# Patient Record
Sex: Female | Born: 1960
Health system: Southern US, Community
[De-identification: ages and names within clinical notes are randomized; demographics above are authoritative.]

## PROBLEM LIST (undated history)

## (undated) DIAGNOSIS — T8859XA Other complications of anesthesia, initial encounter: Secondary | ICD-10-CM

## (undated) DIAGNOSIS — Z9889 Other specified postprocedural states: Secondary | ICD-10-CM

## (undated) DIAGNOSIS — R112 Nausea with vomiting, unspecified: Secondary | ICD-10-CM

## (undated) HISTORY — PX: PAROTID GLAND TUMOR EXCISION: SHX5221

## (undated) HISTORY — PX: CERVICAL SPINE SURGERY: SHX589

## (undated) HISTORY — PX: WISDOM TOOTH EXTRACTION: SHX21

---

## 2001-12-22 HISTORY — PX: CERVICAL SPINE SURGERY: SHX589

## 2008-12-22 HISTORY — PX: AUGMENTATION MAMMAPLASTY: SUR837

## 2019-06-28 LAB — HM MAMMOGRAPHY

## 2019-08-08 LAB — HM COLONOSCOPY

## 2020-01-31 ENCOUNTER — Other Ambulatory Visit: Payer: Self-pay | Admitting: Obstetrics and Gynecology

## 2020-03-08 DIAGNOSIS — N951 Menopausal and female climacteric states: Secondary | ICD-10-CM | POA: Diagnosis not present

## 2020-03-08 DIAGNOSIS — Z7189 Other specified counseling: Secondary | ICD-10-CM | POA: Diagnosis not present

## 2020-03-08 DIAGNOSIS — Z79890 Hormone replacement therapy (postmenopausal): Secondary | ICD-10-CM | POA: Diagnosis not present

## 2020-03-12 DIAGNOSIS — F520 Hypoactive sexual desire disorder: Secondary | ICD-10-CM | POA: Diagnosis not present

## 2020-03-12 DIAGNOSIS — N951 Menopausal and female climacteric states: Secondary | ICD-10-CM | POA: Diagnosis not present

## 2020-03-19 DIAGNOSIS — Z13220 Encounter for screening for lipoid disorders: Secondary | ICD-10-CM | POA: Diagnosis not present

## 2020-03-19 DIAGNOSIS — Z7189 Other specified counseling: Secondary | ICD-10-CM | POA: Diagnosis not present

## 2020-03-19 DIAGNOSIS — Z79899 Other long term (current) drug therapy: Secondary | ICD-10-CM | POA: Diagnosis not present

## 2020-03-19 DIAGNOSIS — F520 Hypoactive sexual desire disorder: Secondary | ICD-10-CM | POA: Diagnosis not present

## 2020-04-03 DIAGNOSIS — Z7189 Other specified counseling: Secondary | ICD-10-CM | POA: Diagnosis not present

## 2020-04-18 DIAGNOSIS — Z7189 Other specified counseling: Secondary | ICD-10-CM | POA: Diagnosis not present

## 2020-04-18 DIAGNOSIS — N951 Menopausal and female climacteric states: Secondary | ICD-10-CM | POA: Diagnosis not present

## 2020-05-01 ENCOUNTER — Ambulatory Visit
Admission: EM | Admit: 2020-05-01 | Discharge: 2020-05-01 | Disposition: A | Discharge status: Home / Self Care | Payer: 59

## 2020-05-01 ENCOUNTER — Encounter: Payer: Self-pay | Admitting: Emergency Medicine

## 2020-05-01 ENCOUNTER — Other Ambulatory Visit: Payer: Self-pay

## 2020-05-01 DIAGNOSIS — R519 Headache, unspecified: Secondary | ICD-10-CM

## 2020-05-01 DIAGNOSIS — R5383 Other fatigue: Secondary | ICD-10-CM

## 2020-05-01 NOTE — Discharge Instructions (Addendum)
Your COVID test is pending.  You should self quarantine until the test result is back.    Take Tylenol as needed for fever or discomfort.  Rest and keep yourself hydrated.    Go to the emergency department if you develop shortness of breath, severe diarrhea, high fever not relieved by Tylenol or ibuprofen, or other concerning symptoms.    

## 2020-05-01 NOTE — ED Triage Notes (Signed)
Patient in today c/o headache, fatigue and fever (100.5 with Tylenol or Ibuprofen). Patient states the headache is getting some better and no fever this morning, but fatigue has continued. Patient has had both Pfizer vaccines for Covid, with the last ~3-4 weeks ago.

## 2020-05-01 NOTE — ED Provider Notes (Signed)
Roderic Palau    CSN: [id number] Arrival date & time: 05/01/20  0935      History   Chief Complaint Chief Complaint  Patient presents with  . Headache  . Fever  . Fatigue    HPI [name] is a 59 y.o. female.   Patient presents with fatigue, headache, fever x4 days.  T-max 100.5.  She has been treating her symptoms at home with Tylenol and ibuprofen.  She denies sore throat, cough, shortness of breath, vomiting, diarrhea, or other symptoms.    The history is provided by the patient.    History reviewed. No pertinent past medical history.  There are no problems to display for this patient.   Past Surgical History:  Procedure Laterality Date  . CERVICAL SPINE SURGERY    . PAROTID GLAND TUMOR EXCISION      OB History   No obstetric history on file.      Home Medications    Prior to Admission medications   Medication Sig Start Date End Date Taking? Authorizing Provider  Biotin 5 MG CAPS Take 1 capsule by mouth in the morning and at bedtime.   Yes [provider]  Cholecalciferol 50 MCG (2000 UT) CAPS Take 1 capsule by mouth daily.   Yes [provider]  esomeprazole (NEXIUM) 20 MG capsule Take 1 capsule by mouth daily.   Yes [provider]  Estradiol 10 MCG TABS vaginal tablet Place 1 tablet vaginally 2 (two) times a week. 02/02/20 05/02/20 Yes [provider]  melatonin 3 MG TABS tablet Take 3 mg by mouth at bedtime.   Yes [provider]  metaxalone (SKELAXIN) 800 MG tablet Take 1 tablet by mouth at bedtime. prn 09/25/14  Yes [provider]  Probiotic Product (ALIGN PO) Take 1 tablet by mouth daily.   Yes [provider]  progesterone (PROMETRIUM) 200 MG capsule Take 1 capsule by mouth at bedtime. 11/14/14  Yes [provider]  testosterone cypionate (DEPOTESTOSTERONE CYPIONATE) 200 MG/ML injection Inject 200 mg into the muscle every 28 (twenty-eight) days. 02/06/20  Yes [provider]  traZODone (DESYREL) 50 MG tablet Take 25 mg by mouth at bedtime. 11/07/14  Yes [provider]    Family History Family History  Problem Relation Age of Onset  . Lung cancer Mother   . Thyroid disease Mother   . Hypertension Mother   . Hyperlipidemia Mother   . CAD Mother        stent x 1  . Leukemia Father 36    Social History Social History   Tobacco Use  . Smoking status: Never Smoker  . Smokeless tobacco: Never Used  Substance Use Topics  . Alcohol use: Yes    Comment: ocassional  . Drug use: Never     Allergies   Pertussis vaccines   Review of Systems Review of Systems  Constitutional: Positive for fatigue and fever. Negative for chills.  HENT: Negative for congestion, ear pain and sore throat.   Eyes: Negative for pain and visual disturbance.  Respiratory: Negative for cough and shortness of breath.   Cardiovascular: Negative for chest pain and palpitations.  Gastrointestinal: Negative for abdominal pain, diarrhea, nausea and vomiting.  Genitourinary: Negative for dysuria and hematuria.  Musculoskeletal: Negative for arthralgias and back pain.  Skin: Negative for color change and rash.  Neurological: Positive for headaches. Negative for seizures, syncope, weakness and numbness.  All other systems reviewed and are negative.    Physical Exam  Triage Vital Signs ED Triage Vitals  Enc Vitals Group     BP 05/01/20 0939 101/68     Pulse Rate 05/01/20 0939 94     Resp 05/01/20 0939 18     Temp 05/01/20 0939 98.2 F (36.8 C)     Temp Source 05/01/20 0939 Oral     SpO2 05/01/20 0939 97 %     Weight 05/01/20 0940 148 lb (67.1 kg)     Height 05/01/20 0940 5\' 3"  (1.6 m)     Head Circumference --      Peak Flow --      Pain Score 05/01/20 0940 3     Pain Loc --      Pain Edu? --      Excl. in [place]? --    No data found.  Updated Vital Signs BP 101/68 (BP Location: Left Arm)   Pulse 94   Temp 98.2 F (36.8 C) (Oral)   Resp 18    Ht 5\' 3"  (1.6 m)   Wt 148 lb (67.1 kg)   SpO2 97%   BMI 26.22 kg/m   Visual Acuity Right Eye Distance:   Left Eye Distance:   Bilateral Distance:    Right Eye Near:   Left Eye Near:    Bilateral Near:     Physical Exam Vitals and nursing note reviewed.  Constitutional:      General: She is not in acute distress.    Appearance: She is well-developed. She is ill-appearing.  HENT:     Head: Normocephalic and atraumatic.     Right Ear: Tympanic membrane normal.     Left Ear: Tympanic membrane normal.     Nose: Nose normal.     Mouth/Throat:     Mouth: Mucous membranes are moist.     Pharynx: Oropharynx is clear.  Eyes:     Conjunctiva/sclera: Conjunctivae normal.  Cardiovascular:     Rate and Rhythm: Normal rate and regular rhythm.     Heart sounds: No murmur.  Pulmonary:     Effort: Pulmonary effort is normal. No respiratory distress.     Breath sounds: Normal breath sounds.  Abdominal:     Palpations: Abdomen is soft.     Tenderness: There is no abdominal tenderness. There is no guarding or rebound.  Musculoskeletal:     Cervical back: Neck supple.  Skin:    General: Skin is warm and dry.     Findings: No rash.  Neurological:     General: No focal deficit present.     Mental Status: She is alert and oriented to person, place, and time.     Sensory: No sensory deficit.     Motor: No weakness.     Gait: Gait normal.  Psychiatric:        Mood and Affect: Mood normal.        Behavior: Behavior normal.      UC Treatments / Results  Labs (all labs ordered are listed, but only abnormal results are displayed) Labs Reviewed  NOVEL CORONAVIRUS, NAA    EKG   Radiology No results found.  Procedures Procedures (including critical care time)  Medications Ordered in UC Medications - No data to display  Initial Impression / Assessment and Plan / UC Course  I have reviewed the triage vital signs and the nursing notes.  Pertinent labs & imaging results that  were available during my care of the patient were reviewed by me and considered in my medical decision  making (see chart for details).   Fatigue, headache.  PCR COVID test performed here.  Instructed patient to self quarantine until the test result is back.  Discussed with patient that she can take Tylenol as needed for fever or discomfort.  Instructed patient to go to the emergency department if she develops high fever, shortness of breath, severe diarrhea, or other concerning symptoms.  Patient agrees with plan of care.     Final Clinical Impressions(s) / UC Diagnoses   Final diagnoses:  Fatigue, unspecified type  Acute nonintractable headache, unspecified headache type     Discharge Instructions     Your COVID test is pending.  You should self quarantine until the test result is back.    Take Tylenol as needed for fever or discomfort.  Rest and keep yourself hydrated.    Go to the emergency department if you develop shortness of breath, severe diarrhea, high fever not relieved by Tylenol or ibuprofen, or other concerning symptoms.       ED Prescriptions    None     PDMP not reviewed this encounter.   Sharion Balloon, NP 05/01/20 1017

## 2020-05-02 LAB — SARS-COV-2, NAA 2 DAY TAT

## 2020-05-02 LAB — NOVEL CORONAVIRUS, NAA: SARS-CoV-2, NAA: NOT DETECTED

## 2020-05-11 DIAGNOSIS — N951 Menopausal and female climacteric states: Secondary | ICD-10-CM | POA: Diagnosis not present

## 2020-05-11 DIAGNOSIS — Z79890 Hormone replacement therapy (postmenopausal): Secondary | ICD-10-CM | POA: Diagnosis not present

## 2020-05-11 DIAGNOSIS — F520 Hypoactive sexual desire disorder: Secondary | ICD-10-CM | POA: Diagnosis not present

## 2020-05-20 ENCOUNTER — Encounter: Payer: Self-pay | Admitting: Nurse Practitioner

## 2020-05-20 DIAGNOSIS — Z79890 Hormone replacement therapy (postmenopausal): Secondary | ICD-10-CM | POA: Insufficient documentation

## 2020-05-20 DIAGNOSIS — G4700 Insomnia, unspecified: Secondary | ICD-10-CM | POA: Insufficient documentation

## 2020-05-23 ENCOUNTER — Other Ambulatory Visit: Payer: Self-pay

## 2020-05-23 ENCOUNTER — Encounter: Payer: Self-pay | Admitting: Nurse Practitioner

## 2020-05-23 ENCOUNTER — Ambulatory Visit: Payer: 59 | Admitting: Nurse Practitioner

## 2020-05-23 VITALS — BP 108/69 | HR 98 | Temp 98.4°F | Ht 62.8 in | Wt 158.6 lb

## 2020-05-23 DIAGNOSIS — Z79890 Hormone replacement therapy (postmenopausal): Secondary | ICD-10-CM

## 2020-05-23 DIAGNOSIS — M47812 Spondylosis without myelopathy or radiculopathy, cervical region: Secondary | ICD-10-CM

## 2020-05-23 DIAGNOSIS — Z7689 Persons encountering health services in other specified circumstances: Secondary | ICD-10-CM | POA: Diagnosis not present

## 2020-05-23 DIAGNOSIS — K219 Gastro-esophageal reflux disease without esophagitis: Secondary | ICD-10-CM | POA: Diagnosis not present

## 2020-05-23 DIAGNOSIS — F5101 Primary insomnia: Secondary | ICD-10-CM

## 2020-05-23 DIAGNOSIS — N952 Postmenopausal atrophic vaginitis: Secondary | ICD-10-CM

## 2020-05-23 DIAGNOSIS — M8580 Other specified disorders of bone density and structure, unspecified site: Secondary | ICD-10-CM

## 2020-05-23 MED ORDER — NITROFURANTOIN MONOHYD MACRO 100 MG PO CAPS: ORAL_CAPSULE | ORAL | 4 refills | Status: DC

## 2020-05-23 NOTE — Progress Notes (Signed)
New Patient Office Visit  Subjective:  Patient ID: [name], female    DOB: 10/15/1961  Age: 59 y.o. MRN: [id number]  CC:  Chief Complaint  Patient presents with   Establish Care    HPI [name] presents for new patient visit to establish care.  Introduced to Designer, jewellery role and practice setting.  All questions answered.  Discussed provider/patient relationship and expectations.  At her last DEXA in 2019 was told had some osteopenia, takes Vitamin D.    She is followed by GYN for menopausal symptoms and last saw Dr. Leafy Ro 04/18/20.  She receives Testosterone injections at their office and continues on Progesterone and Yuvafem.  She started on this regimen in [place] due to major symptoms, including arthritic type pain and muscle pains + sleep issues.  On current regimen she is able to function better.  She takes occasional Skelaxin due to neck pain occasionally with past surgery, C5-C7 diskectomy in 2003.  Continues on Macrobid after intercourse as preventative.  INSOMNIA Started on Trazodone during menopause, takes this nightly. Duration: chronic Satisfied with sleep quality: yes Difficulty falling asleep: at times Difficulty staying asleep: no Waking a few hours after sleep onset: no Early morning awakenings: no Daytime hypersomnolence: no Wakes feeling refreshed: no Good sleep hygiene: no Apnea: no Snoring: no Depressed/anxious mood: no Recent stress: no Restless legs/nocturnal leg cramps: no Chronic pain/arthritis: no History of sleep study: no Treatments attempted: Trazodone and ambien   GERD Continues on Nexium 20 MG daily, has taken for several years.  Has endoscopy last year with her colonoscopy.   GERD control status: stable  Satisfied with current treatment? yes Heartburn frequency: none Medication side effects: no  Medication compliance: stable Dysphagia: no Odynophagia:  no Hematemesis: no Blood in stool: no EGD: yes  History reviewed.  No pertinent past medical history.  Past Surgical History:  Procedure Laterality Date   CERVICAL SPINE SURGERY  2003   C5-C7- double dissectomy   PAROTID GLAND TUMOR EXCISION      Family History  Problem Relation Age of Onset   Lung cancer Mother    Thyroid disease Mother    Hypertension Mother    Hyperlipidemia Mother    CAD Mother        stent x 1   Osteoporosis Mother    Leukemia Father 36   Rheum arthritis Sister    Melanoma Sister    Osteopenia Sister    Autoimmune disease Daughter    Colon cancer Maternal Uncle    Heart disease Maternal Grandmother    Colon cancer Maternal Grandfather    Heart disease Maternal Grandfather    Stroke Maternal Grandfather    Heart disease Paternal Grandfather     Social History   Socioeconomic History   Marital status: Married    Spouse name: Not on file   Number of children: Not on file   Years of education: Not on file   Highest education level: Not on file  Occupational History   Not on file  Tobacco Use   Smoking status: Never Smoker   Smokeless tobacco: Never Used  Substance and Sexual Activity   Alcohol use: Yes    Comment: ocassional   Drug use: Never   Sexual activity: Yes  Other Topics Concern   Not on file  Social History Narrative   Not on file   Social Determinants of Health   Financial Resource Strain: Low Risk    Difficulty of Paying Living Expenses: Not hard  at all  Food Insecurity: No Food Insecurity   Worried About Charity fundraiser in the Last Year: Never true   Ran Out of Food in the Last Year: Never true  Transportation Needs: No Transportation Needs   Lack of Transportation (Medical): No   Lack of Transportation (Non-Medical): No  Physical Activity: Insufficiently Active   Days of Exercise per Week: 3 days   Minutes of Exercise per Session: 30 min  Stress: Stress Concern Present   Feeling of Stress : Rather much  Social Connections: Somewhat Isolated    Frequency of Communication with Friends and Family: Three times a week   Frequency of Social Gatherings with Friends and Family: Three times a week   Attends Religious Services: Never   Active Member of Clubs or Organizations: No   Attends Archivist Meetings: Never   Marital Status: Married  Human resources officer Violence:    Fear of Current or Ex-Partner:    Emotionally Abused:    Physically Abused:    Sexually Abused:     ROS Review of Systems  Constitutional: Negative for activity change, appetite change, diaphoresis, fatigue and fever.  Respiratory: Negative for cough, chest tightness and shortness of breath.   Cardiovascular: Negative for chest pain, palpitations and leg swelling.  Gastrointestinal: Negative.   Neurological: Negative.   Psychiatric/Behavioral: Negative.     Objective:   Today's Vitals: BP 108/69    Pulse 98    Temp 98.4 F (36.9 C) (Oral)    Ht 5' 2.8" (1.595 m)    Wt 158 lb 9.6 oz (71.9 kg)    SpO2 96%    BMI 28.27 kg/m   Physical Exam Vitals and nursing note reviewed.  Constitutional:      General: She is awake. She is not in acute distress.    Appearance: She is well-developed, well-groomed and overweight. She is not ill-appearing.  HENT:     Head: Normocephalic.     Right Ear: Hearing normal.     Left Ear: Hearing normal.  Eyes:     General: Lids are normal.        Right eye: No discharge.        Left eye: No discharge.     Conjunctiva/sclera: Conjunctivae normal.     Pupils: Pupils are equal, round, and reactive to light.  Neck:     Thyroid: No thyromegaly.     Vascular: No carotid bruit.  Cardiovascular:     Rate and Rhythm: Normal rate and regular rhythm.     Heart sounds: Normal heart sounds. No murmur. No gallop.   Pulmonary:     Effort: Pulmonary effort is normal. No accessory muscle usage or respiratory distress.     Breath sounds: Normal breath sounds.  Abdominal:     General: Bowel sounds are normal.      Palpations: Abdomen is soft.  Musculoskeletal:     Cervical back: Normal range of motion and neck supple.     Right lower leg: No edema.     Left lower leg: No edema.  Skin:    General: Skin is warm and dry.  Neurological:     Mental Status: She is alert and oriented to person, place, and time.     Deep Tendon Reflexes: Reflexes are normal and symmetric.  Psychiatric:        Attention and Perception: Attention normal.        Mood and Affect: Mood normal.  Speech: Speech normal.        Behavior: Behavior normal. Behavior is cooperative.        Thought Content: Thought content normal.     Assessment & Plan:   Problem List Items Addressed This Visit      Digestive   GERD (gastroesophageal reflux disease)    Chronic, stable.  Continue daily Nexium and adjust regimen as needed.  Review past chart once available. Had colonoscopy and EGD last year.        Musculoskeletal and Integument   Cervical spine arthritis    Ongoing post-surgery cervical spine C5-C7 diskectomy in 2003.  Continue Skelaxin as needed, refills upon request.      Osteopenia    Per patient noted on DEXA in 2019.  Continue daily Vitamin D3 and Calcium supplements.  Repeat DEXA in 2024.        Genitourinary   Vaginal atrophy    Ongoing, continue post coital abx prophylaxis as needed to avoid UTI.  Refills sent on this.        Other   Insomnia    Chronic, stable.  Continue current medication regimen, Trazodone and Melatonin, adjust as needed.  Will send refills upon request.  Continue focus on good sleep hygiene techniques at home.  Return for physical in upcoming weeks.      Postmenopausal hormone therapy    Continue collaboration with GYN at [facility].       Other Visit Diagnoses    Encounter to establish care    -  Primary      Outpatient Encounter Medications as of 05/23/2020  Medication Sig   Biotin 5 MG CAPS Take 1 capsule by mouth in the morning and at bedtime.    Cholecalciferol 50 MCG (2000 UT) CAPS Take 1 capsule by mouth daily.   esomeprazole (NEXIUM) 20 MG capsule Take 1 capsule by mouth daily.   melatonin 3 MG TABS tablet Take 3 mg by mouth at bedtime.   metaxalone (SKELAXIN) 800 MG tablet Take 1 tablet by mouth at bedtime. prn   nitrofurantoin, macrocrystal-monohydrate, (MACROBID) 100 MG capsule Take 1 tablet (100 MG) by mouth only after intercourse.   Probiotic Product (ALIGN PO) Take 1 tablet by mouth daily.   progesterone (PROMETRIUM) 200 MG capsule Take 1 capsule by mouth at bedtime.   testosterone cypionate (DEPOTESTOSTERONE CYPIONATE) 200 MG/ML injection Inject 200 mg into the muscle every 28 (twenty-eight) days.   traZODone (DESYREL) 50 MG tablet Take 25 mg by mouth at bedtime.   YUVAFEM 10 MCG TABS vaginal tablet Place 1 tablet vaginally 2 (two) times a week.   [DISCONTINUED] nitrofurantoin, macrocrystal-monohydrate, (MACROBID) 100 MG capsule Take by mouth. Take 1 tablet only after intercourse   No facility-administered encounter medications on file as of 05/23/2020.    Follow-up: Return if symptoms worsen or fail to improve, for patient to call and schedule physical once she sees when last one was at previous practice.   Venita Lick, NP

## 2020-05-23 NOTE — Assessment & Plan Note (Signed)
Ongoing, continue post coital abx prophylaxis as needed to avoid UTI.  Refills sent on this.

## 2020-05-23 NOTE — Assessment & Plan Note (Signed)
Chronic, stable.  Continue current medication regimen, Trazodone and Melatonin, adjust as needed.  Will send refills upon request.  Continue focus on good sleep hygiene techniques at home.  Return for physical in upcoming weeks.

## 2020-05-23 NOTE — Assessment & Plan Note (Signed)
Continue collaboration with GYN at [facility]. 

## 2020-05-23 NOTE — Assessment & Plan Note (Signed)
Per patient noted on DEXA in 2019.  Continue daily Vitamin D3 and Calcium supplements.  Repeat DEXA in 2024.

## 2020-05-23 NOTE — Assessment & Plan Note (Signed)
Chronic, stable.  Continue daily Nexium and adjust regimen as needed.  Review past chart once available. Had colonoscopy and EGD last year.

## 2020-05-23 NOTE — Patient Instructions (Signed)
Healthy Eating Following a healthy eating pattern may help you to achieve and maintain a healthy body weight, reduce the risk of chronic disease, and live a long and productive life. It is important to follow a healthy eating pattern at an appropriate calorie level for your body. Your nutritional needs should be met primarily through food by choosing a variety of nutrient-rich foods. What are tips for following this plan? Reading food labels  Read labels and choose the following: ? Reduced or low sodium. ? Juices with 100% fruit juice. ? Foods with low saturated fats and high polyunsaturated and monounsaturated fats. ? Foods with whole grains, such as whole wheat, cracked wheat, brown rice, and wild rice. ? Whole grains that are fortified with folic acid. This is recommended for women who are pregnant or who want to become pregnant.  Read labels and avoid the following: ? Foods with a lot of added sugars. These include foods that contain brown sugar, corn sweetener, corn syrup, dextrose, fructose, glucose, high-fructose corn syrup, honey, invert sugar, lactose, malt syrup, maltose, molasses, raw sugar, sucrose, trehalose, or turbinado sugar.  Do not eat more than the following amounts of added sugar per day:  6 teaspoons (25 g) for women.  9 teaspoons (38 g) for men. ? Foods that contain processed or refined starches and grains. ? Refined grain products, such as white flour, degermed cornmeal, white bread, and white rice. Shopping  Choose nutrient-rich snacks, such as vegetables, whole fruits, and nuts. Avoid high-calorie and high-sugar snacks, such as potato chips, fruit snacks, and candy.  Use oil-based dressings and spreads on foods instead of solid fats such as butter, stick margarine, or cream cheese.  Limit pre-made sauces, mixes, and "instant" products such as flavored rice, instant noodles, and ready-made pasta.  Try more plant-protein sources, such as tofu, tempeh, black beans,  edamame, lentils, nuts, and seeds.  Explore eating plans such as the Mediterranean diet or vegetarian diet. Cooking  Use oil to saut or stir-fry foods instead of solid fats such as butter, stick margarine, or lard.  Try baking, boiling, grilling, or broiling instead of frying.  Remove the fatty part of meats before cooking.  Steam vegetables in water or broth. Meal planning   At meals, imagine dividing your plate into fourths: ? One-half of your plate is fruits and vegetables. ? One-fourth of your plate is whole grains. ? One-fourth of your plate is protein, especially lean meats, poultry, eggs, tofu, beans, or nuts.  Include low-fat dairy as part of your daily diet. Lifestyle  Choose healthy options in all settings, including home, work, school, restaurants, or stores.  Prepare your food safely: ? Wash your hands after handling raw meats. ? Keep food preparation surfaces clean by regularly washing with hot, soapy water. ? Keep raw meats separate from ready-to-eat foods, such as fruits and vegetables. ? Cook seafood, meat, poultry, and eggs to the recommended internal temperature. ? Store foods at safe temperatures. In general:  Keep cold foods at 59F (4.4C) or below.  Keep hot foods at 159F (60C) or above.  Keep your freezer at [facility] (-17.8C) or below.  Foods are no longer safe to eat when they have been between the temperatures of 40-159F (4.4-60C) for more than 2 hours. What foods should I eat? Fruits Aim to eat 2 cup-equivalents of fresh, canned (in natural juice), or frozen fruits each day. Examples of 1 cup-equivalent of fruit include 1 small apple, 8 large strawberries, 1 cup canned fruit,  cup  dried fruit, or 1 cup 100% juice. Vegetables Aim to eat 2-3 cup-equivalents of fresh and frozen vegetables each day, including different varieties and colors. Examples of 1 cup-equivalent of vegetables include 2 medium carrots, 2 cups raw, leafy greens, 1 cup chopped  vegetable (raw or cooked), or 1 medium baked potato. Grains Aim to eat 6 ounce-equivalents of whole grains each day. Examples of 1 ounce-equivalent of grains include 1 slice of bread, 1 cup ready-to-eat cereal, 3 cups popcorn, or  cup cooked rice, pasta, or cereal. Meats and other proteins Aim to eat 5-6 ounce-equivalents of protein each day. Examples of 1 ounce-equivalent of protein include 1 egg, 1/2 cup nuts or seeds, or 1 tablespoon (16 g) peanut butter. A cut of meat or fish that is the size of a deck of cards is about 3-4 ounce-equivalents.  Of the protein you eat each week, try to have at least 8 ounces come from seafood. This includes salmon, trout, herring, and anchovies. Dairy Aim to eat 3 cup-equivalents of fat-free or low-fat dairy each day. Examples of 1 cup-equivalent of dairy include 1 cup (240 mL) milk, 8 ounces (250 g) yogurt, 1 ounces (44 g) natural cheese, or 1 cup (240 mL) fortified soy milk. Fats and oils  Aim for about 5 teaspoons (21 g) per day. Choose monounsaturated fats, such as canola and olive oils, avocados, peanut butter, and most nuts, or polyunsaturated fats, such as sunflower, corn, and soybean oils, walnuts, pine nuts, sesame seeds, sunflower seeds, and flaxseed. Beverages  Aim for six 8-oz glasses of water per day. Limit coffee to three to five 8-oz cups per day.  Limit caffeinated beverages that have added calories, such as soda and energy drinks.  Limit alcohol intake to no more than 1 drink a day for nonpregnant women and 2 drinks a day for men. One drink equals 12 oz of beer (355 mL), 5 oz of wine (148 mL), or 1 oz of hard liquor (44 mL). Seasoning and other foods  Avoid adding excess amounts of salt to your foods. Try flavoring foods with herbs and spices instead of salt.  Avoid adding sugar to foods.  Try using oil-based dressings, sauces, and spreads instead of solid fats. This information is based on general [place] nutrition guidelines. For more  information, visit [employer]. Exact amounts may vary based on your nutrition needs. Summary  A healthy eating plan may help you to maintain a healthy weight, reduce the risk of chronic diseases, and stay active throughout your life.  Plan your meals. Make sure you eat the right portions of a variety of nutrient-rich foods.  Try baking, boiling, grilling, or broiling instead of frying.  Choose healthy options in all settings, including home, work, school, restaurants, or stores. This information is not intended to replace advice given to you by your health care provider. Make sure you discuss any questions you have with your health care provider. Document Revised: 03/22/2018 Document Reviewed: 03/22/2018 Elsevier Patient Education  [place].

## 2020-05-23 NOTE — Assessment & Plan Note (Signed)
Ongoing post-surgery cervical spine C5-C7 diskectomy in 2003.  Continue Skelaxin as needed, refills upon request.

## 2020-06-13 DIAGNOSIS — Z79890 Hormone replacement therapy (postmenopausal): Secondary | ICD-10-CM | POA: Diagnosis not present

## 2020-06-13 DIAGNOSIS — F520 Hypoactive sexual desire disorder: Secondary | ICD-10-CM | POA: Diagnosis not present

## 2020-06-19 ENCOUNTER — Other Ambulatory Visit: Payer: Self-pay | Admitting: Nurse Practitioner

## 2020-06-19 MED ORDER — TRAZODONE HCL 50 MG PO TABS: 25.0000 mg | ORAL_TABLET | Freq: Every day | ORAL | 4 refills | Status: DC

## 2020-07-12 ENCOUNTER — Encounter: Payer: Self-pay | Admitting: Nurse Practitioner

## 2020-07-12 ENCOUNTER — Other Ambulatory Visit: Payer: Self-pay

## 2020-07-12 ENCOUNTER — Ambulatory Visit (INDEPENDENT_AMBULATORY_CARE_PROVIDER_SITE_OTHER): Payer: 59 | Admitting: Nurse Practitioner

## 2020-07-12 VITALS — BP 113/72 | HR 86 | Temp 98.6°F | Ht 62.1 in | Wt 158.6 lb

## 2020-07-12 DIAGNOSIS — Z114 Encounter for screening for human immunodeficiency virus [HIV]: Secondary | ICD-10-CM

## 2020-07-12 DIAGNOSIS — Z1231 Encounter for screening mammogram for malignant neoplasm of breast: Secondary | ICD-10-CM

## 2020-07-12 DIAGNOSIS — M8580 Other specified disorders of bone density and structure, unspecified site: Secondary | ICD-10-CM | POA: Diagnosis not present

## 2020-07-12 DIAGNOSIS — K219 Gastro-esophageal reflux disease without esophagitis: Secondary | ICD-10-CM | POA: Diagnosis not present

## 2020-07-12 DIAGNOSIS — B009 Herpesviral infection, unspecified: Secondary | ICD-10-CM | POA: Insufficient documentation

## 2020-07-12 DIAGNOSIS — Z79890 Hormone replacement therapy (postmenopausal): Secondary | ICD-10-CM

## 2020-07-12 DIAGNOSIS — Z13220 Encounter for screening for lipoid disorders: Secondary | ICD-10-CM | POA: Diagnosis not present

## 2020-07-12 DIAGNOSIS — Z1159 Encounter for screening for other viral diseases: Secondary | ICD-10-CM

## 2020-07-12 DIAGNOSIS — Z0000 Encounter for general adult medical examination without abnormal findings: Secondary | ICD-10-CM

## 2020-07-12 DIAGNOSIS — F5101 Primary insomnia: Secondary | ICD-10-CM

## 2020-07-12 DIAGNOSIS — N952 Postmenopausal atrophic vaginitis: Secondary | ICD-10-CM | POA: Diagnosis not present

## 2020-07-12 MED ORDER — VALACYCLOVIR HCL 1 G PO TABS
ORAL_TABLET | ORAL | 1 refills | Status: DC
Start: 2020-07-12 — End: 2021-07-31

## 2020-07-12 NOTE — Assessment & Plan Note (Addendum)
History of with occasional flares, currently with flare due to stressors.  Valtrex refill sent in.

## 2020-07-12 NOTE — Assessment & Plan Note (Signed)
Continue collaboration with GYN at [facility]. 

## 2020-07-12 NOTE — Patient Instructions (Signed)
[facility]  Address: [address], [place], [place] [address]  Phone: [phone]   Mammogram A mammogram is an X-ray of the breasts that is done to check for changes that are not normal. This test can screen for and find any changes that may suggest breast cancer. Mammograms are regularly done on women. A man may have a mammogram if he has a lump or swelling in his breast. This test can also help to find other changes and variations in the breast. Tell a doctor:  About any allergies you have.  If you have breast implants.  If you have had previous breast disease, biopsy, or surgery.  If you are breastfeeding.  If you are younger than age 48.  If you have a family history of breast cancer.  Whether you are pregnant or may be pregnant. What are the risks? Generally, this is a safe procedure. However, problems may occur, including:  Exposure to radiation. Radiation levels are very low with this test.  The results being misinterpreted.  The need for further tests.  The inability of the mammogram to detect certain cancers. What happens before the procedure?  Have this test done about 1-2 weeks after your period. This is usually when your breasts are the least tender.  If you are visiting a new doctor or clinic, send any past mammogram images to your new doctor's office.  Wash your breasts and under your arms the day of the test.  Do not use deodorants, perfumes, lotions, or powders on the day of the test.  Take off any jewelry from your neck.  Wear clothes that you can change into and out of easily. What happens during the procedure?   You will undress from the waist up. You will put on a gown.  You will stand in front of the X-ray machine.  Each breast will be placed between two plastic or glass plates. The plates will press down on your breast for a few seconds. Try to stay as relaxed as possible. This does not cause any  harm to your breasts. Any discomfort you feel will be very brief.  X-rays will be taken from different angles of each breast. The procedure may vary among doctors and hospitals. What happens after the procedure?  The mammogram will be read by a specialist (radiologist).  You may need to do certain parts of the test again. This depends on the quality of the images.  Ask when your test results will be ready. Make sure you get your test results.  You may go back to your normal activities. Summary  A mammogram is a low energy X-ray of the breasts that is done to check for abnormal changes. A man may have this test if he has a lump or swelling in his breast.  Before the procedure, tell your doctor about any breast problems that you have had in the past.  Have this test done about 1-2 weeks after your period.  For the test, each breast will be placed between two plastic or glass plates. The plates will press down on your breast for a few seconds.  The mammogram will be read by a specialist (radiologist). Ask when your test results will be ready. Make sure you get your test results. This information is not intended to replace advice given to you by your health care provider. Make sure you discuss any questions you have with your health care provider. Document Revised: 07/29/2018 Document  Reviewed: 07/29/2018 Elsevier Patient Education  2020 Elsevier Inc.  

## 2020-07-12 NOTE — Progress Notes (Signed)
BP 113/72   Pulse 86   Temp 98.6 F (37 C) (Oral)   Ht 5' 2.1" (1.577 m)   Wt 158 lb 9.6 oz (71.9 kg)   SpO2 98%   BMI 28.91 kg/m    Subjective:    Patient ID: [name], female    DOB: [birth year], 59 y.o.   MRN: [id number]  HPI: [name] is a 59 y.o. female presenting on 07/12/2020 for comprehensive medical examination. Current medical complaints include:none  She currently lives with: husband Menopausal Symptoms: no    Followed by GYN for menopausal symptoms and last saw Dr. Leafy Ro 04/18/20.  She receives Testosterone injections at their office and continues on Progesterone and Yuvafem.  She started on this regimen in [place] due to major symptoms, including arthritic type pain + muscle pains + sleep issues.  On current regimen she is able to function better.  She takes occasional Skelaxin due to neck pain occasionally with past surgery, C5-C7 diskectomy in 2003.  Continues on Macrobid after intercourse as preventative.  INSOMNIA Started on Trazodone during menopause, takes this nightly. Duration: chronic Satisfied with sleep quality: yes Difficulty falling asleep: at times Difficulty staying asleep: no Waking a few hours after sleep onset: no Early morning awakenings: no Daytime hypersomnolence: no Wakes feeling refreshed: no Good sleep hygiene: no Apnea: no Snoring: no Depressed/anxious mood: no Recent stress: no Restless legs/nocturnal leg cramps: no Chronic pain/arthritis: no History of sleep study: no Treatments attempted: Trazodone and ambien   GERD Continues on Nexium 20 MG daily, has taken for several years.  Has endoscopy last year with her colonoscopy.   GERD control status: stable  Satisfied with current treatment? yes Heartburn frequency: none Medication side effects: no  Medication compliance: stable Dysphagia: no Odynophagia:  no Hematemesis: no Blood in stool: no EGD: yes  Depression Screen done today and results listed below:    Depression screen [facility] 2/9 07/12/2020 05/23/2020  Decreased Interest 0 0  Down, Depressed, Hopeless 0 0  PHQ - 2 Score 0 0    The patient does not have a history of falls. I did not complete a risk assessment for falls. A plan of care for falls was not documented.   Past Medical History:  History reviewed. No pertinent past medical history.  Surgical History:  Past Surgical History:  Procedure Laterality Date  . CERVICAL SPINE SURGERY  2003   C5-C7- double dissectomy  . PAROTID GLAND TUMOR EXCISION      Medications:  Current Outpatient Medications on File Prior to Visit  Medication Sig  . Biotin 5 MG CAPS Take 1 capsule by mouth in the morning and at bedtime.  . Cholecalciferol 50 MCG (2000 UT) CAPS Take 1 capsule by mouth daily.  Marland Kitchen esomeprazole (NEXIUM) 20 MG capsule Take 1 capsule by mouth daily.  . melatonin 3 MG TABS tablet Take 3 mg by mouth at bedtime.  . metaxalone (SKELAXIN) 800 MG tablet Take 1 tablet by mouth at bedtime. prn  . nitrofurantoin, macrocrystal-monohydrate, (MACROBID) 100 MG capsule Take 1 tablet (100 MG) by mouth only after intercourse.  . Probiotic Product (ALIGN PO) Take 1 tablet by mouth daily.  . progesterone (PROMETRIUM) 200 MG capsule Take 1 capsule by mouth at bedtime.  Marland Kitchen testosterone cypionate (DEPOTESTOSTERONE CYPIONATE) 200 MG/ML injection Inject 200 mg into the muscle every 28 (twenty-eight) days.  . traZODone (DESYREL) 50 MG tablet Take 50 mg by mouth at bedtime.  Merril Abbe 10 MCG TABS vaginal tablet Place 1 tablet  vaginally 2 (two) times a week.   No current facility-administered medications on file prior to visit.    Allergies:  No Known Allergies  Social History:  Social History   Socioeconomic History  . Marital status: Married    Spouse name: Not on file  . Number of children: Not on file  . Years of education: Not on file  . Highest education level: Not on file  Occupational History  . Not on file  Tobacco Use  . Smoking  status: Never Smoker  . Smokeless tobacco: Never Used  Vaping Use  . Vaping Use: Never used  Substance and Sexual Activity  . Alcohol use: Yes    Comment: ocassional  . Drug use: Never  . Sexual activity: Yes  Other Topics Concern  . Not on file  Social History Narrative  . Not on file   Social Determinants of Health   Financial Resource Strain: Low Risk   . Difficulty of Paying Living Expenses: Not hard at all  Food Insecurity: No Food Insecurity  . Worried About Charity fundraiser in the Last Year: Never true  . Ran Out of Food in the Last Year: Never true  Transportation Needs: No Transportation Needs  . Lack of Transportation (Medical): No  . Lack of Transportation (Non-Medical): No  Physical Activity: Insufficiently Active  . Days of Exercise per Week: 3 days  . Minutes of Exercise per Session: 30 min  Stress: Stress Concern Present  . Feeling of Stress : Rather much  Social Connections: Moderately Isolated  . Frequency of Communication with Friends and Family: Three times a week  . Frequency of Social Gatherings with Friends and Family: Three times a week  . Attends Religious Services: Never  . Active Member of Clubs or Organizations: No  . Attends Archivist Meetings: Never  . Marital Status: Married  Human resources officer Violence:   . Fear of Current or Ex-Partner:   . Emotionally Abused:   Marland Kitchen Physically Abused:   . Sexually Abused:    Social History   Tobacco Use  Smoking Status Never Smoker  Smokeless Tobacco Never Used   Social History   Substance and Sexual Activity  Alcohol Use Yes   Comment: ocassional    Family History:  Family History  Problem Relation Age of Onset  . Lung cancer Mother   . Thyroid disease Mother   . Hypertension Mother   . Hyperlipidemia Mother   . CAD Mother        stent x 1  . Osteoporosis Mother   . Leukemia Father 62  . Rheum arthritis Sister   . Melanoma Sister   . Osteopenia Sister   . Autoimmune  disease Daughter   . Colon cancer Maternal Uncle   . Heart disease Maternal Grandmother   . Colon cancer Maternal Grandfather   . Heart disease Maternal Grandfather   . Stroke Maternal Grandfather   . Heart disease Paternal Grandfather     Past medical history, surgical history, medications, allergies, family history and social history reviewed with patient today and changes made to appropriate areas of the chart.   Review of Systems - negative All other ROS negative except what is listed above and in the HPI.      Objective:    BP 113/72   Pulse 86   Temp 98.6 F (37 C) (Oral)   Ht 5' 2.1" (1.577 m)   Wt 158 lb 9.6 oz (71.9 kg)   SpO2 98%  BMI 28.91 kg/m   Wt Readings from Last 3 Encounters:  07/12/20 158 lb 9.6 oz (71.9 kg)  05/23/20 158 lb 9.6 oz (71.9 kg)  05/01/20 148 lb (67.1 kg)    Physical Exam Constitutional:      General: She is awake. She is not in acute distress.    Appearance: She is well-developed. She is not ill-appearing.  HENT:     Head: Normocephalic and atraumatic.     Right Ear: Hearing, tympanic membrane, ear canal and external ear normal. No drainage.     Left Ear: Hearing, tympanic membrane, ear canal and external ear normal. No drainage.     Nose: Nose normal.     Right Sinus: No maxillary sinus tenderness or frontal sinus tenderness.     Left Sinus: No maxillary sinus tenderness or frontal sinus tenderness.     Mouth/Throat:     Mouth: Mucous membranes are moist.     Pharynx: Oropharynx is clear. Uvula midline. No pharyngeal swelling, oropharyngeal exudate or posterior oropharyngeal erythema.  Eyes:     General: Lids are normal.        Right eye: No discharge.        Left eye: No discharge.     Extraocular Movements: Extraocular movements intact.     Conjunctiva/sclera: Conjunctivae normal.     Pupils: Pupils are equal, round, and reactive to light.     Visual Fields: Right eye visual fields normal and left eye visual fields normal.    Neck:     Thyroid: No thyromegaly.     Vascular: No carotid bruit.     Trachea: Trachea normal.  Cardiovascular:     Rate and Rhythm: Normal rate and regular rhythm.     Heart sounds: Normal heart sounds. No murmur heard.  No gallop.   Pulmonary:     Effort: Pulmonary effort is normal. No accessory muscle usage or respiratory distress.     Breath sounds: Normal breath sounds.  Chest:     Breasts:        Right: Normal.        Left: Normal.  Abdominal:     General: Bowel sounds are normal.     Palpations: Abdomen is soft. There is no hepatomegaly or splenomegaly.     Tenderness: There is no abdominal tenderness.  Musculoskeletal:        General: Normal range of motion.     Cervical back: Normal range of motion and neck supple.     Right lower leg: No edema.     Left lower leg: No edema.  Lymphadenopathy:     Head:     Right side of head: No submental, submandibular, tonsillar, preauricular or posterior auricular adenopathy.     Left side of head: No submental, submandibular, tonsillar, preauricular or posterior auricular adenopathy.     Cervical: No cervical adenopathy.     Upper Body:     Right upper body: No supraclavicular, axillary or pectoral adenopathy.     Left upper body: No supraclavicular, axillary or pectoral adenopathy.  Skin:    General: Skin is warm and dry.     Capillary Refill: Capillary refill takes less than 2 seconds.     Findings: No rash.  Neurological:     Mental Status: She is alert and oriented to person, place, and time.     Cranial Nerves: Cranial nerves are intact.     Gait: Gait is intact.     Deep Tendon Reflexes: Reflexes are  normal and symmetric.     Reflex Scores:      Brachioradialis reflexes are 2+ on the right side and 2+ on the left side.      Patellar reflexes are 2+ on the right side and 2+ on the left side. Psychiatric:        Attention and Perception: Attention normal.        Mood and Affect: Mood normal.        Speech: Speech  normal.        Behavior: Behavior normal. Behavior is cooperative.        Thought Content: Thought content normal.        Judgment: Judgment normal.    Results for orders placed or performed during the hospital encounter of 05/01/20  Novel Coronavirus, NAA ([facility])   Specimen: Nasal Swab; Nasopharyngeal(NP) swabs in vial transport medium   NASOPHARYNGE  Result Value Ref Range   SARS-CoV-2, NAA Not Detected Not Detected  SARS-COV-2, NAA 2 DAY TAT   NASOPHARYNGE  Result Value Ref Range   SARS-CoV-2, NAA 2 DAY TAT Performed       Assessment & Plan:   Problem List Items Addressed This Visit      Digestive   GERD (gastroesophageal reflux disease)    Chronic, stable.  Continue daily Nexium and adjust regimen as needed.  Had colonoscopy and EGD in 2020.  Mag level today.      Relevant Orders   Comprehensive metabolic panel   Magnesium     Musculoskeletal and Integument   Osteopenia    Per patient noted on DEXA in 2019.  Continue daily Vitamin D3 and Calcium supplements.  Repeat DEXA order placed due to hormone treatment by GYN.  Vit D level today.      Relevant Orders   VITAMIN D 25 Hydroxy (Vit-D Deficiency, Fractures)   DG Bone Density     Genitourinary   Vaginal atrophy    Ongoing, continue post coital abx prophylaxis as needed to avoid UTI.  Refills as needed.      Relevant Orders   TSH     Other   Insomnia    Chronic, stable.  Continue current medication regimen, Trazodone and Melatonin, adjust as needed.  Continue focus on good sleep hygiene techniques at home.  Return in 6 months.      Relevant Orders   TSH   Postmenopausal hormone therapy    Continue collaboration with GYN at [facility]      Herpes    History of with occasional flares, currently with flare due to stressors.  Valtrex refill sent in.      Relevant Medications   valACYclovir (VALTREX) 1000 MG tablet    Other Visit Diagnoses    Routine general medical examination at a health care  facility    -  Primary   Relevant Orders   CBC with Differential/Platelet   Comprehensive metabolic panel   Lipid Panel w/o Chol/HDL Ratio   Screening for cholesterol level       Lipid panel ordered   Relevant Orders   Lipid Panel w/o Chol/HDL Ratio   Encounter for screening mammogram for malignant neoplasm of breast       Mammogram ordered   Relevant Orders   MM DIGITAL SCREENING BILATERAL   Encounter for screening for HIV       HIV screening ordered   Relevant Orders   HIV Antibody (routine testing w rflx)   Need for hepatitis C screening test  Hep C screening ordered   Relevant Orders   Hepatitis C antibody       Follow up plan: Return in about 6 months (around 01/12/2021) for Insomnia.   LABORATORY TESTING:  - Pap smear: up to date  IMMUNIZATIONS:   - Tdap: Tetanus vaccination status reviewed: last tetanus booster within 10 years. - Influenza: Up to date - Pneumovax: Not applicable - Prevnar: Not applicable - HPV: Not applicable - Zostavax vaccine: Refused  SCREENING: -Mammogram: Ordered today  - Colonoscopy: Up to date  - Bone Density: Ordered today  -Hearing Test: Not applicable  -Spirometry: Not applicable   PATIENT COUNSELING:   Advised to take 1 mg of folate supplement per day if capable of pregnancy.   Sexuality: Discussed sexually transmitted diseases, partner selection, use of condoms, avoidance of unintended pregnancy  and contraceptive alternatives.   Advised to avoid cigarette smoking.  I discussed with the patient that most people either abstain from alcohol or drink within safe limits (<=14/week and <=4 drinks/occasion for males, <=7/weeks and <= 3 drinks/occasion for females) and that the risk for alcohol disorders and other health effects rises proportionally with the number of drinks per week and how often a drinker exceeds daily limits.  Discussed cessation/primary prevention of drug use and availability of treatment for abuse.   Diet:  Encouraged to adjust caloric intake to maintain  or achieve ideal body weight, to reduce intake of dietary saturated fat and total fat, to limit sodium intake by avoiding high sodium foods and not adding table salt, and to maintain adequate dietary potassium and calcium preferably from fresh fruits, vegetables, and low-fat dairy products.    stressed the importance of regular exercise  Injury prevention: Discussed safety belts, safety helmets, smoke detector, smoking near bedding or upholstery.   Dental health: Discussed importance of regular tooth brushing, flossing, and dental visits.    NEXT PREVENTATIVE PHYSICAL DUE IN 1 YEAR. Return in about 6 months (around 01/12/2021) for Insomnia.

## 2020-07-12 NOTE — Assessment & Plan Note (Signed)
Ongoing, continue post coital abx prophylaxis as needed to avoid UTI.  Refills as needed. 

## 2020-07-12 NOTE — Assessment & Plan Note (Signed)
Chronic, stable.  Continue daily Nexium and adjust regimen as needed.  Had colonoscopy and EGD in 2020.  Mag level today. 

## 2020-07-12 NOTE — Assessment & Plan Note (Signed)
Chronic, stable.  Continue current medication regimen, Trazodone and Melatonin, adjust as needed.  Continue focus on good sleep hygiene techniques at home.  Return in 6 months.

## 2020-07-12 NOTE — Assessment & Plan Note (Addendum)
Per patient noted on DEXA in 2019.  Continue daily Vitamin D3 and Calcium supplements.  Repeat DEXA order placed due to hormone treatment by GYN.  Vit D level today.

## 2020-07-13 LAB — CBC WITH DIFFERENTIAL/PLATELET
Basophils Absolute: 0 10*3/uL (ref 0.0–0.2)
Basos: 1 %
EOS (ABSOLUTE): 0 10*3/uL (ref 0.0–0.4)
Eos: 1 %
Hematocrit: 38.5 % (ref 34.0–46.6)
Hemoglobin: 12.6 g/dL (ref 11.1–15.9)
Immature Grans (Abs): 0 10*3/uL (ref 0.0–0.1)
Immature Granulocytes: 0 %
Lymphocytes Absolute: 1.8 10*3/uL (ref 0.7–3.1)
Lymphs: 33 %
MCH: 27.8 pg (ref 26.6–33.0)
MCHC: 32.7 g/dL (ref 31.5–35.7)
MCV: 85 fL (ref 79–97)
Monocytes Absolute: 0.5 10*3/uL (ref 0.1–0.9)
Monocytes: 10 %
Neutrophils Absolute: 3.1 10*3/uL (ref 1.4–7.0)
Neutrophils: 55 %
Platelets: 207 10*3/uL (ref 150–450)
RBC: 4.53 x10E6/uL (ref 3.77–5.28)
RDW: 13.1 % (ref 11.7–15.4)
WBC: 5.5 10*3/uL (ref 3.4–10.8)

## 2020-07-13 LAB — COMPREHENSIVE METABOLIC PANEL
ALT: 22 IU/L (ref 0–32)
AST: 18 IU/L (ref 0–40)
Albumin/Globulin Ratio: 2.3 — ABNORMAL HIGH (ref 1.2–2.2)
Albumin: 4.6 g/dL (ref 3.8–4.9)
Alkaline Phosphatase: 91 IU/L (ref 48–121)
BUN/Creatinine Ratio: 28 — ABNORMAL HIGH (ref 9–23)
BUN: 23 mg/dL (ref 6–24)
Bilirubin Total: 0.3 mg/dL (ref 0.0–1.2)
CO2: 24 mmol/L (ref 20–29)
Calcium: 9.2 mg/dL (ref 8.7–10.2)
Chloride: 100 mmol/L (ref 96–106)
Creatinine, Ser: 0.82 mg/dL (ref 0.57–1.00)
GFR calc Af Amer: 91 mL/min/{1.73_m2} (ref >59)
GFR calc non Af Amer: 79 mL/min/{1.73_m2} (ref >59)
Globulin, Total: 2 g/dL (ref 1.5–4.5)
Glucose: 89 mg/dL (ref 65–99)
Potassium: 4.2 mmol/L (ref 3.5–5.2)
Sodium: 137 mmol/L (ref 134–144)
Total Protein: 6.6 g/dL (ref 6.0–8.5)

## 2020-07-13 LAB — VITAMIN D 25 HYDROXY (VIT D DEFICIENCY, FRACTURES): Vit D, 25-Hydroxy: 38.1 ng/mL (ref 30.0–100.0)

## 2020-07-13 LAB — LIPID PANEL W/O CHOL/HDL RATIO
Cholesterol, Total: 169 mg/dL (ref 100–199)
HDL: 65 mg/dL (ref >39)
LDL Chol Calc (NIH): 83 mg/dL (ref 0–99)
Triglycerides: 122 mg/dL (ref 0–149)
VLDL Cholesterol Cal: 21 mg/dL (ref 5–40)

## 2020-07-13 LAB — TSH: TSH: 3.23 u[IU]/mL (ref 0.450–4.500)

## 2020-07-13 LAB — HIV ANTIBODY (ROUTINE TESTING W REFLEX): HIV Screen 4th Generation wRfx: NONREACTIVE

## 2020-07-13 LAB — MAGNESIUM: Magnesium: 2 mg/dL (ref 1.6–2.3)

## 2020-07-13 LAB — HEPATITIS C ANTIBODY: Hep C Virus Ab: <0.1 s/co ratio (ref 0.0–0.9)

## 2020-07-17 DIAGNOSIS — F520 Hypoactive sexual desire disorder: Secondary | ICD-10-CM | POA: Diagnosis not present

## 2020-07-17 DIAGNOSIS — Z79890 Hormone replacement therapy (postmenopausal): Secondary | ICD-10-CM | POA: Diagnosis not present

## 2020-08-02 ENCOUNTER — Telehealth: Payer: Self-pay | Admitting: Nurse Practitioner

## 2020-08-02 NOTE — Telephone Encounter (Signed)
Copied from [place] [id number]. Topic: Quick Communication - See Telephone Encounter >> Aug 02, 2020  8:54 AM Loma Boston wrote: CRM for notification. See Telephone encounter for: 08/02/20. Pt called this am and wanted to make her husband New Pt appt and was told that we were not accepting right at this time. Pt got very upset and said that is what we told her last time and that Tritia Endo had told her that she did not know why she was given that information as we were taking New Patients. Called front desk and was told to send CRM for approval, Pt was told extremely sorry for confusing info, we have had a dr leave and may be confusion over that. Have told her that we are giving to [facility] to sign off on her husband Mialynn Shelvin. Follow up with Shaquisha at [phone] for approval from [facility] and possibly a N PT appt >> Aug 02, 2020  3:17 PM Stark Klein wrote: Forwarding for approval.

## 2020-08-17 DIAGNOSIS — F520 Hypoactive sexual desire disorder: Secondary | ICD-10-CM | POA: Diagnosis not present

## 2020-08-17 DIAGNOSIS — Z79890 Hormone replacement therapy (postmenopausal): Secondary | ICD-10-CM | POA: Diagnosis not present

## 2020-09-04 ENCOUNTER — Other Ambulatory Visit: Payer: Self-pay | Admitting: Nurse Practitioner

## 2020-09-04 DIAGNOSIS — Z1283 Encounter for screening for malignant neoplasm of skin: Secondary | ICD-10-CM

## 2020-09-05 ENCOUNTER — Other Ambulatory Visit: Payer: Self-pay | Admitting: Obstetrics and Gynecology

## 2020-09-05 DIAGNOSIS — Z1231 Encounter for screening mammogram for malignant neoplasm of breast: Secondary | ICD-10-CM

## 2020-09-17 DIAGNOSIS — L608 Other nail disorders: Secondary | ICD-10-CM | POA: Diagnosis not present

## 2020-09-17 DIAGNOSIS — D2361 Other benign neoplasm of skin of right upper limb, including shoulder: Secondary | ICD-10-CM | POA: Diagnosis not present

## 2020-09-17 DIAGNOSIS — L821 Other seborrheic keratosis: Secondary | ICD-10-CM | POA: Diagnosis not present

## 2020-09-27 DIAGNOSIS — Z79890 Hormone replacement therapy (postmenopausal): Secondary | ICD-10-CM | POA: Diagnosis not present

## 2020-09-27 DIAGNOSIS — F520 Hypoactive sexual desire disorder: Secondary | ICD-10-CM | POA: Diagnosis not present

## 2020-10-04 ENCOUNTER — Other Ambulatory Visit: Payer: Self-pay | Admitting: Nurse Practitioner

## 2020-10-04 ENCOUNTER — Telehealth: Payer: Self-pay

## 2020-10-04 MED ORDER — TRAZODONE HCL 50 MG PO TABS: 50.0000 mg | ORAL_TABLET | Freq: Every day | ORAL | 4 refills | Status: DC

## 2020-10-04 NOTE — Telephone Encounter (Signed)
Routing to provider to advise.  

## 2020-10-04 NOTE — Telephone Encounter (Signed)
Copied from [place] [id number]. Topic: General - Other >> Oct 04, 2020 11:51 AM Rainey Pines A wrote: Pt stated that her medication instructions were written out incorrectly and sent to pharmacy. Patient stated that the trazadone should be writtien out 50 mg 1 tablet at bedtime instead of half a tablet. Please advise [place], [address]  Phone:  [phone] Fax:  [phone]

## 2020-10-04 NOTE — Telephone Encounter (Signed)
Please let her know order in chart states 50 mg at bedtime, does not state 1/2 tablet.  I resent order for this and if not corrected please [place] know and we can call pharmacy.  Thank you.:)

## 2020-10-05 NOTE — Telephone Encounter (Signed)
Patient notified

## 2020-10-23 ENCOUNTER — Other Ambulatory Visit: Payer: Self-pay | Admitting: Obstetrics and Gynecology

## 2020-10-23 ENCOUNTER — Other Ambulatory Visit: Payer: Self-pay

## 2020-10-23 ENCOUNTER — Ambulatory Visit
Admission: RE | Admit: 2020-10-23 | Discharge: 2020-10-23 | Disposition: A | Discharge status: Home / Self Care | Payer: 59 | Source: Ambulatory Visit | Attending: Obstetrics and Gynecology | Admitting: Obstetrics and Gynecology

## 2020-10-23 DIAGNOSIS — Z1231 Encounter for screening mammogram for malignant neoplasm of breast: Secondary | ICD-10-CM | POA: Diagnosis not present

## 2020-10-23 DIAGNOSIS — D259 Leiomyoma of uterus, unspecified: Secondary | ICD-10-CM | POA: Diagnosis not present

## 2020-10-23 DIAGNOSIS — Z86018 Personal history of other benign neoplasm: Secondary | ICD-10-CM | POA: Diagnosis not present

## 2020-10-23 DIAGNOSIS — Z01419 Encounter for gynecological examination (general) (routine) without abnormal findings: Secondary | ICD-10-CM | POA: Diagnosis not present

## 2020-10-23 LAB — HM PAP SMEAR

## 2020-10-23 IMAGING — MG DIGITAL SCREENING BREAST BILAT IMPLANT W/ TOMO W/ CAD
8 of 14 series · 8 of 34 positions shown · non-contrast
Comparison: Previous exam [DATE] and earlier from [facility] in
[place].

CLINICAL DATA: Screening.

EXAM:
DIGITAL SCREENING BILATERAL MAMMOGRAM WITH IMPLANTS, CAD AND TOMO

[L MLO]
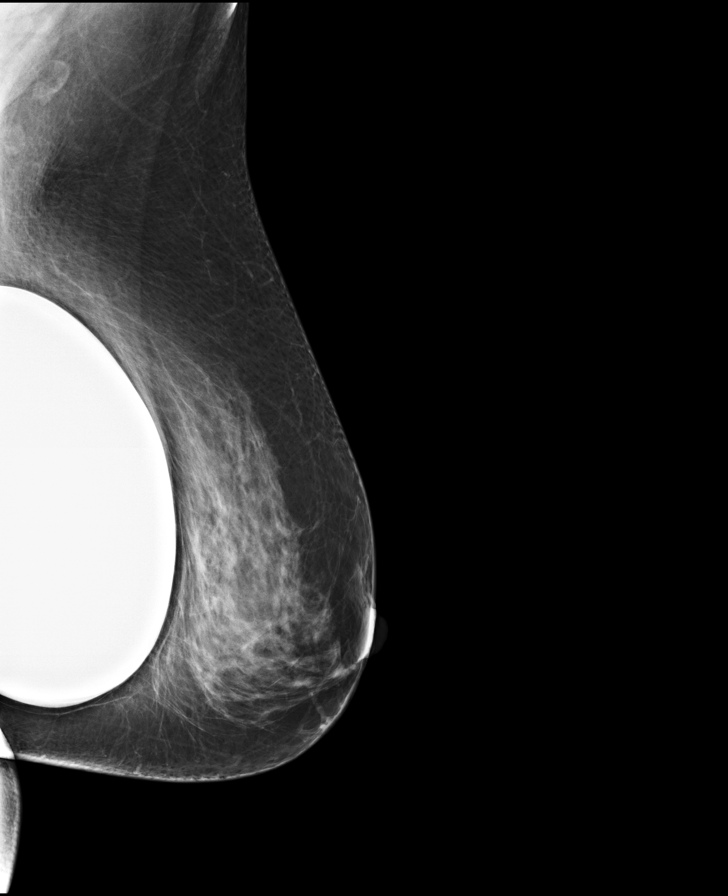

[R CC]
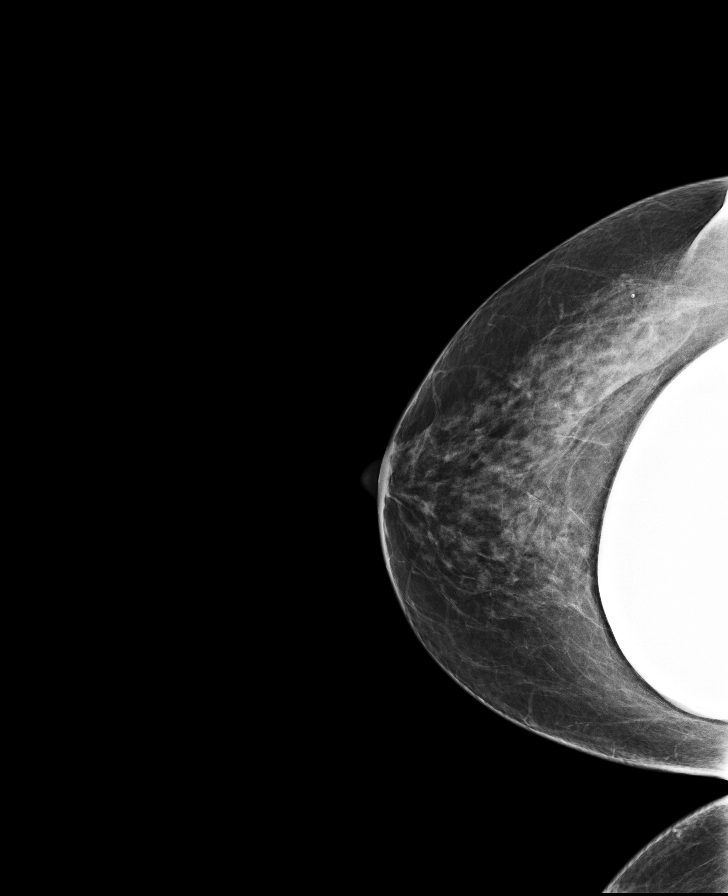

[L CC]
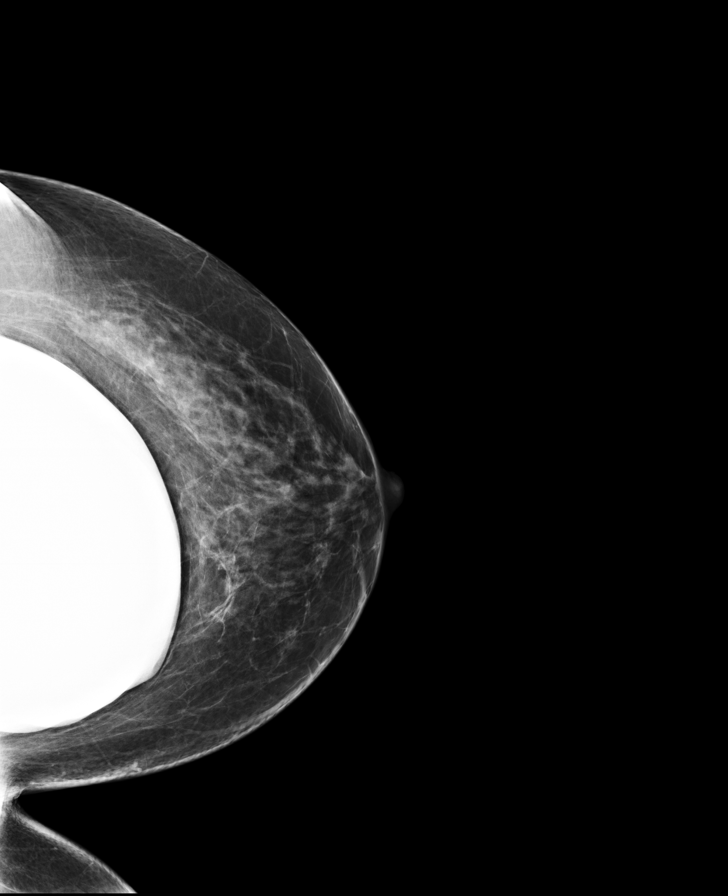

[R MLO]
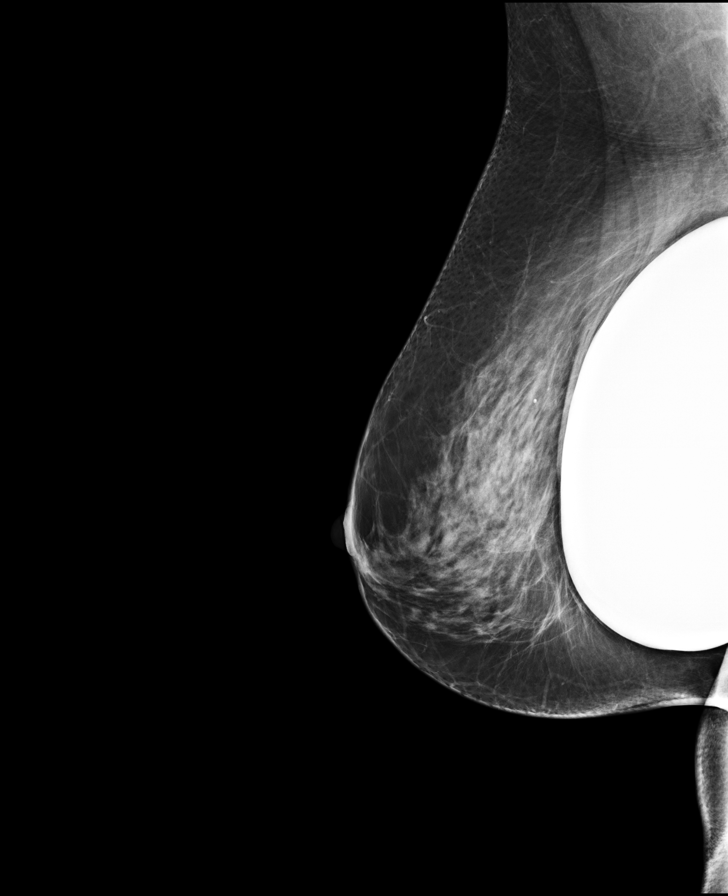

[L CC synth-2D]
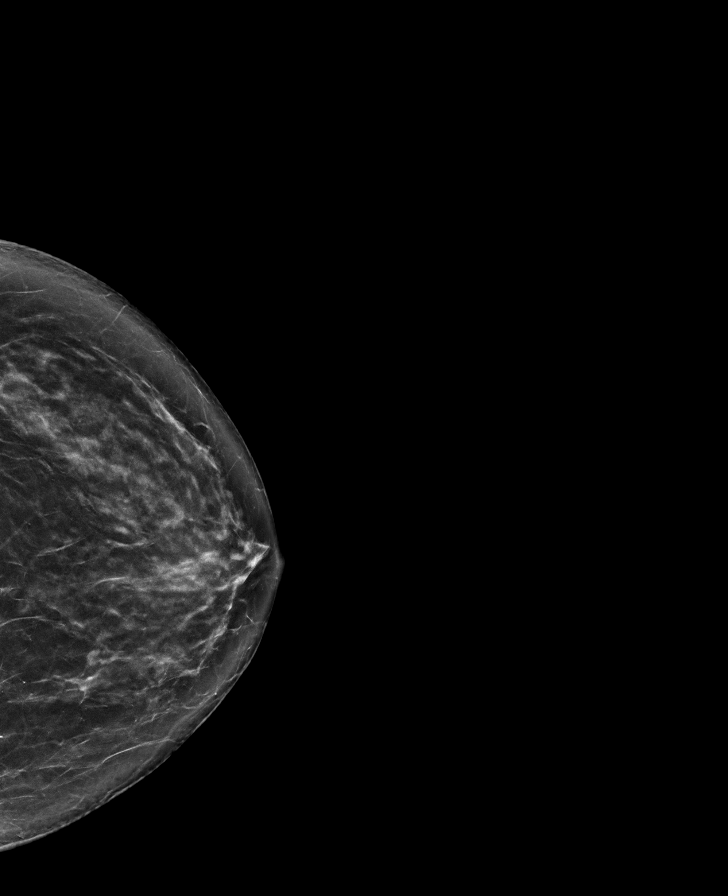

[L MLO synth-2D]
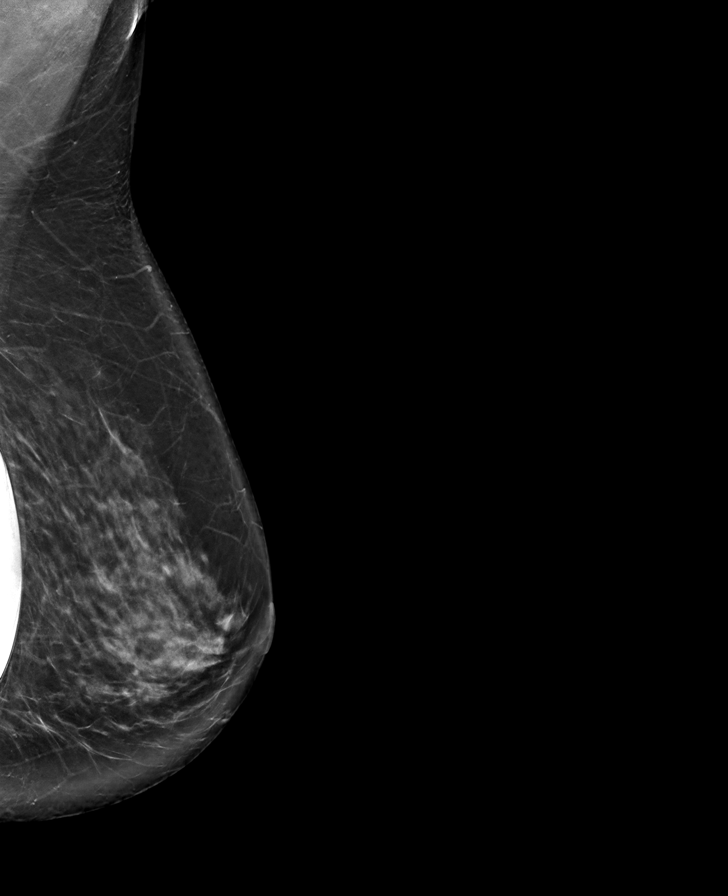

[R CC synth-2D]
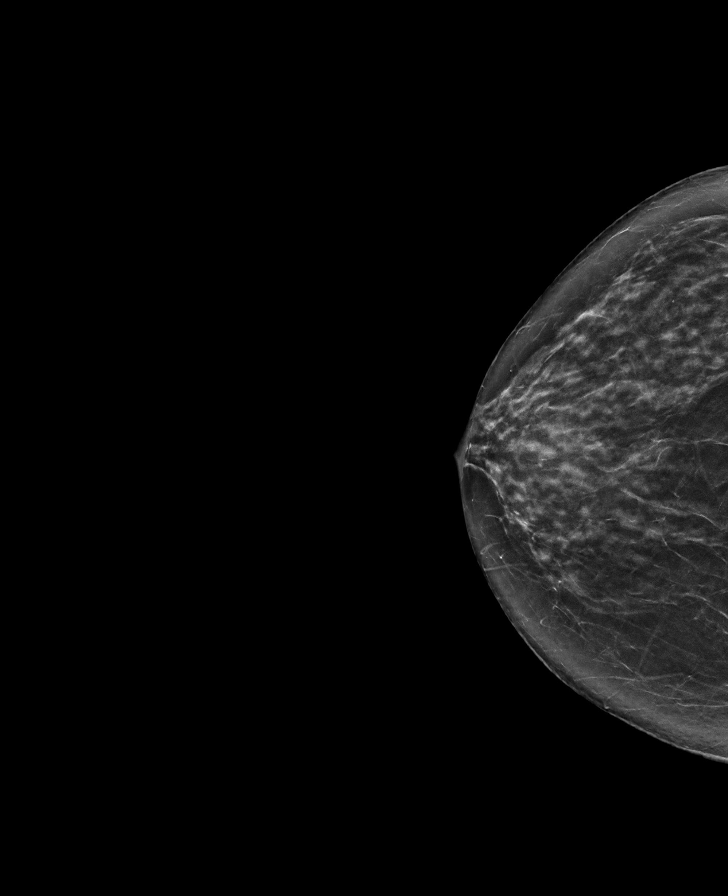

[R MLO synth-2D]
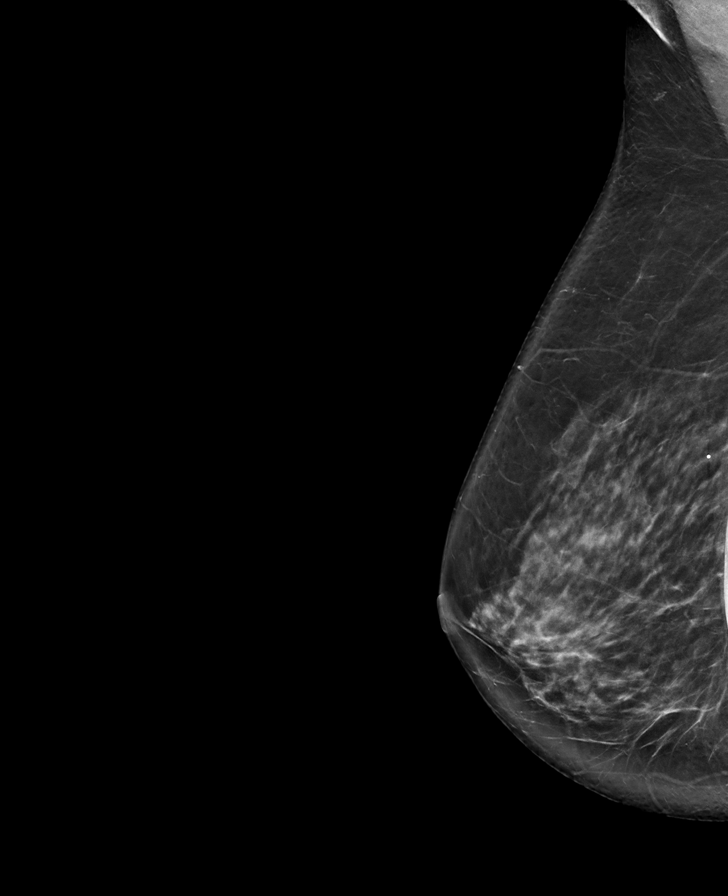

[8 of 34 positions shown; findings below may reference images not displayed]

Awaiting the prior outside mammograms accounts for
the delay in this report.

ACR Breast Density Category c: The breast tissue is heterogeneously
dense, which may obscure small masses.
FINDINGS: The patient has retropectoral gel implants. Digital 2D full field CC
and MLO views of both breasts and tomosynthesis and synthesized
digital 2D implant displaced CC and MLO views of both breasts were
obtained.

There are no findings suspicious for malignancy. Images were
processed with CAD.
IMPRESSION: No mammographic evidence of malignancy. A result letter of this
screening mammogram will be mailed directly to the patient.

RECOMMENDATION:
Screening mammogram in one year. (Code:[S8])

BI-RADS CATEGORY  1:  Negative.

## 2020-11-08 DIAGNOSIS — N959 Unspecified menopausal and perimenopausal disorder: Secondary | ICD-10-CM | POA: Diagnosis not present

## 2020-11-08 DIAGNOSIS — F520 Hypoactive sexual desire disorder: Secondary | ICD-10-CM | POA: Diagnosis not present

## 2020-12-31 DIAGNOSIS — F520 Hypoactive sexual desire disorder: Secondary | ICD-10-CM | POA: Diagnosis not present

## 2020-12-31 DIAGNOSIS — Z79890 Hormone replacement therapy (postmenopausal): Secondary | ICD-10-CM | POA: Diagnosis not present

## 2021-01-02 DIAGNOSIS — M8588 Other specified disorders of bone density and structure, other site: Secondary | ICD-10-CM | POA: Diagnosis not present

## 2021-01-02 LAB — HM DEXA SCAN

## 2021-01-03 ENCOUNTER — Ambulatory Visit: Payer: 59 | Admitting: Dermatology

## 2021-01-05 ENCOUNTER — Encounter: Payer: Self-pay | Admitting: Nurse Practitioner

## 2021-01-05 DIAGNOSIS — Z86018 Personal history of other benign neoplasm: Secondary | ICD-10-CM | POA: Insufficient documentation

## 2021-01-10 ENCOUNTER — Ambulatory Visit: Payer: 59 | Admitting: Nurse Practitioner

## 2021-01-16 ENCOUNTER — Ambulatory Visit: Payer: 59 | Admitting: Dermatology

## 2021-01-22 ENCOUNTER — Other Ambulatory Visit: Payer: Self-pay | Admitting: Obstetrics and Gynecology

## 2021-01-22 ENCOUNTER — Ambulatory Visit: Payer: 59 | Admitting: Nurse Practitioner

## 2021-01-22 ENCOUNTER — Other Ambulatory Visit: Payer: Self-pay

## 2021-01-22 ENCOUNTER — Encounter: Payer: Self-pay | Admitting: Nurse Practitioner

## 2021-01-22 VITALS — BP 97/63 | HR 75 | Temp 97.8°F | Ht 62.32 in | Wt 157.2 lb

## 2021-01-22 DIAGNOSIS — F5101 Primary insomnia: Secondary | ICD-10-CM | POA: Diagnosis not present

## 2021-01-22 DIAGNOSIS — Z79890 Hormone replacement therapy (postmenopausal): Secondary | ICD-10-CM

## 2021-01-22 NOTE — Assessment & Plan Note (Signed)
Continue collaboration with GYN at [facility]. 

## 2021-01-22 NOTE — Assessment & Plan Note (Signed)
Chronic, stable.  Continue current medication regimen, Trazodone and Melatonin, adjust as needed.  Continue focus on good sleep hygiene techniques at home.  Return in 6 months.

## 2021-01-22 NOTE — Patient Instructions (Signed)
OSTEOPENIA: We recommend Vitamin D supplementation of about 2,0000 IUs of over the counter Vitamin D3.  In addition, we recommend a diet high in calcium (1200MG ) with dairy and dark green leafy vegetables. We would like you to get plenty of weight bearing exercises with walking and resistance training such as light weights or resistance bands available with instructions at places such as [employer].     Osteopenia  Osteopenia is a loss of thickness (density) inside the bones. Another name for osteopenia is low bone mass. Mild osteopenia is a normal part of aging. It is not a disease, and it does not cause symptoms. However, if you have osteopenia and continue to lose bone mass, you could develop a condition that causes the bones to become thin and break more easily (osteoporosis). Osteoporosis can cause you to lose some height, have back pain, and have a stooped posture. Although osteopenia is not a disease, making changes to your lifestyle and diet can help to prevent osteopenia from developing into osteoporosis. What are the causes? Osteopenia is caused by loss of calcium in the bones. Bones are constantly changing. Old bone cells are continually being replaced with new bone cells. This process builds new bone. The mineral calcium is needed to build new bone and maintain bone density. Bone density is usually highest around age 49. After that, most people's bodies cannot replace all the bone they have lost with new bone. What increases the risk? You are more likely to develop this condition if:  You are older than age 20.  You are a woman who went through menopause early.  You have a long illness that keeps you in bed.  You do not get enough exercise.  You lack certain nutrients (malnutrition).  You have an overactive thyroid gland (hyperthyroidism).  You use products that contain nicotine or tobacco, such as cigarettes, e-cigarettes and chewing tobacco, or you drink a lot of alcohol.  You  are taking medicines that weaken the bones, such as steroids. What are the signs or symptoms? This condition does not cause any symptoms. You may have a slightly higher risk for bone breaks (fractures), so getting fractures more easily than normal may be an indication of osteopenia. How is this diagnosed? This condition may be diagnosed based on an X-ray exam that measures bone density (dual-energy X-ray absorptiometry, or DEXA). This test can measure bone density in your hips, spine, and wrists. Osteopenia has no symptoms, so this condition is usually diagnosed after a routine bone density screening test is done for osteoporosis. This routine screening is usually done for:  Women who are age 71 or older.  Men who are age 65 or older. If you have risk factors for osteopenia, you may have the screening test at an earlier age. How is this treated? Making dietary and lifestyle changes can lower your risk for osteoporosis. If you have severe osteopenia that is close to becoming osteoporosis, this condition can be treated with medicines and dietary supplements such as calcium and vitamin D. These supplements help to rebuild bone density. Follow these instructions at home: Eating and drinking Eat a diet that is high in calcium and vitamin D.  Calcium is found in dairy products, beans, salmon, and leafy green vegetables like spinach and broccoli.  Look for foods that have vitamin D and calcium added to them (fortified foods), such as orange juice, cereal, and bread.   Lifestyle  Do 30 minutes or more of a weight-bearing exercise every day, such as  walking, jogging, or playing a sport. These types of exercises strengthen the bones.  Do not use any products that contain nicotine or tobacco, such as cigarettes, e-cigarettes, and chewing tobacco. If you need help quitting, ask your health care provider.  Do not drink alcohol if: ? Your health care provider tells you not to drink. ? You are  pregnant, may be pregnant, or are planning to become pregnant.  If you drink alcohol: ? Limit how much you use to:  0-1 drink a day for women.  0-2 drinks a day for men. ? Be aware of how much alcohol is in your drink. In the [place] one drink equals one 12 oz bottle of beer (355 mL), one 5 oz glass of wine (148 mL), or one 1 oz glass of hard liquor (44 mL). General instructions  Take over-the-counter and prescription medicines only as told by your health care provider. These include vitamins and supplements.  Take precautions at home to lower your risk of falling, such as: ? Keeping rooms well-lit and free of clutter, such as cords. ? Installing safety rails on stairs. ? Using rubber mats in the bathroom or other areas that are often wet or slippery.  Keep all follow-up visits. This is important. Contact a health care provider if:  You have not had a bone density screening for osteoporosis and you are: ? A woman who is age 21 or older. ? A man who is age 14 or older.  You are a postmenopausal woman who has not had a bone density screening for osteoporosis.  You are older than age 46 and you want to know if you should have bone density screening for osteoporosis. Summary  Osteopenia is a loss of thickness (density) inside the bones. Another name for osteopenia is low bone mass.  Osteopenia is not a disease, but it may increase your risk for a condition that causes the bones to become thin and break more easily (osteoporosis).  You may be at risk for osteopenia if you are older than age 11 or if you are a woman who went through early menopause.  Osteopenia does not cause any symptoms, but it can be diagnosed with a bone density screening test.  Dietary and lifestyle changes are the first treatment for osteopenia. These may lower your risk for osteoporosis. This information is not intended to replace advice given to you by your health care provider. Make sure you discuss any  questions you have with your health care provider. Document Revised: 05/24/2020 Document Reviewed: 05/24/2020 Elsevier Patient Education  [place].

## 2021-01-22 NOTE — Progress Notes (Signed)
BP 97/63   Pulse 75   Temp 97.8 F (36.6 C) (Oral)   Ht 5' 2.32" (1.583 m)   Wt 157 lb 3.2 oz (71.3 kg)   SpO2 98%   BMI 28.46 kg/m    Subjective:    Patient ID: [name], female    DOB: 1961/04/04, 60 y.o.   MRN: [id number]  HPI: [name] is a 60 y.o. female  Chief Complaint  Patient presents with  . Insomnia    Patient states this is getting a little better   INSOMNIA Continues on Trazodone 50 MG daily at bedtime + Melatonin 6 MG.  Is followed by gynecology for menopausal treatment with last visit 10/23/20.  Continues on testosterone injections, progesterone, and Yuvafem.  Started Nutrisystem yesterday and working on weight loss. Duration: chronic Satisfied with sleep quality: yes Difficulty falling asleep: no Difficulty staying asleep: no Waking a few hours after sleep onset: no Early morning awakenings: no Daytime hypersomnolence: no Wakes feeling refreshed: no Good sleep hygiene: no Apnea: none Snoring: yes Depressed/anxious mood: no Recent stress: no Restless legs/nocturnal leg cramps: no Chronic pain/arthritis: no History of sleep study: no Treatments attempted: Trazodone and melatonin   Relevant past medical, surgical, family and social history reviewed and updated as indicated. Interim medical history since our last visit reviewed. Allergies and medications reviewed and updated.  Review of Systems  Constitutional: Negative for activity change, appetite change, diaphoresis, fatigue and fever.  Respiratory: Negative for cough, chest tightness and shortness of breath.   Cardiovascular: Negative for chest pain, palpitations and leg swelling.  Gastrointestinal: Negative.   Neurological: Negative.   Psychiatric/Behavioral: Negative.     Per HPI unless specifically indicated above     Objective:    BP 97/63   Pulse 75   Temp 97.8 F (36.6 C) (Oral)   Ht 5' 2.32" (1.583 m)   Wt 157 lb 3.2 oz (71.3 kg)   SpO2 98%   BMI 28.46 kg/m   Wt  Readings from Last 3 Encounters:  01/22/21 157 lb 3.2 oz (71.3 kg)  07/12/20 158 lb 9.6 oz (71.9 kg)  05/23/20 158 lb 9.6 oz (71.9 kg)    Physical Exam Vitals and nursing note reviewed.  Constitutional:      General: She is awake. She is not in acute distress.    Appearance: She is well-developed, well-groomed and overweight. She is not ill-appearing.  HENT:     Head: Normocephalic.     Right Ear: Hearing normal.     Left Ear: Hearing normal.  Eyes:     General: Lids are normal.        Right eye: No discharge.        Left eye: No discharge.     Conjunctiva/sclera: Conjunctivae normal.     Pupils: Pupils are equal, round, and reactive to light.  Neck:     Thyroid: No thyromegaly.     Vascular: No carotid bruit.  Cardiovascular:     Rate and Rhythm: Normal rate and regular rhythm.     Heart sounds: Normal heart sounds. No murmur heard. No gallop.   Pulmonary:     Effort: Pulmonary effort is normal. No accessory muscle usage or respiratory distress.     Breath sounds: Normal breath sounds.  Abdominal:     General: Bowel sounds are normal.     Palpations: Abdomen is soft.  Musculoskeletal:     Cervical back: Normal range of motion and neck supple.     Right  lower leg: No edema.     Left lower leg: No edema.  Skin:    General: Skin is warm and dry.  Neurological:     Mental Status: She is alert and oriented to person, place, and time.     Deep Tendon Reflexes: Reflexes are normal and symmetric.  Psychiatric:        Attention and Perception: Attention normal.        Mood and Affect: Mood normal.        Speech: Speech normal.        Behavior: Behavior normal. Behavior is cooperative.        Thought Content: Thought content normal.    Results for orders placed or performed in visit on 07/13/20  HM MAMMOGRAPHY  Result Value Ref Range   HM Mammogram 0-4 Bi-Rad 0-4 Bi-Rad, Self Reported Normal  HM COLONOSCOPY  Result Value Ref Range   HM Colonoscopy See Report (in chart)  See Report (in chart), Patient Reported      Assessment & Plan:   Problem List Items Addressed This Visit      Other   Insomnia - Primary    Chronic, stable.  Continue current medication regimen, Trazodone and Melatonin, adjust as needed.  Continue focus on good sleep hygiene techniques at home.  Return in 6 months.      Postmenopausal hormone therapy    Continue collaboration with GYN at [facility].          Follow up plan: Return in about 6 months (around 07/22/2021) for Annual physical.

## 2021-01-30 ENCOUNTER — Other Ambulatory Visit: Payer: Self-pay

## 2021-01-30 ENCOUNTER — Encounter (HOSPITAL_COMMUNITY): Payer: Self-pay

## 2021-01-30 ENCOUNTER — Emergency Department (HOSPITAL_COMMUNITY)
Admission: EM | Admit: 2021-01-30 | Discharge: 2021-01-30 | Disposition: A | Discharge status: Home / Self Care | Payer: PRIVATE HEALTH INSURANCE | Attending: Emergency Medicine | Admitting: Emergency Medicine

## 2021-01-30 ENCOUNTER — Emergency Department (HOSPITAL_COMMUNITY): Payer: PRIVATE HEALTH INSURANCE | Attending: Emergency Medicine

## 2021-01-30 ENCOUNTER — Other Ambulatory Visit (HOSPITAL_COMMUNITY): Payer: Self-pay | Admitting: Emergency Medicine

## 2021-01-30 ENCOUNTER — Ambulatory Visit (HOSPITAL_COMMUNITY): Payer: PRIVATE HEALTH INSURANCE

## 2021-01-30 DIAGNOSIS — S060X0A Concussion without loss of consciousness, initial encounter: Secondary | ICD-10-CM | POA: Diagnosis not present

## 2021-01-30 DIAGNOSIS — Y990 Civilian activity done for income or pay: Secondary | ICD-10-CM | POA: Diagnosis not present

## 2021-01-30 DIAGNOSIS — W208XXA Other cause of strike by thrown, projected or falling object, initial encounter: Secondary | ICD-10-CM | POA: Insufficient documentation

## 2021-01-30 DIAGNOSIS — R110 Nausea: Secondary | ICD-10-CM | POA: Insufficient documentation

## 2021-01-30 DIAGNOSIS — M542 Cervicalgia: Secondary | ICD-10-CM | POA: Insufficient documentation

## 2021-01-30 DIAGNOSIS — S0990XA Unspecified injury of head, initial encounter: Secondary | ICD-10-CM | POA: Diagnosis present

## 2021-01-30 DIAGNOSIS — Y92239 Unspecified place in hospital as the place of occurrence of the external cause: Secondary | ICD-10-CM | POA: Insufficient documentation

## 2021-01-30 IMAGING — CT CT HEAD W/O CM
4 series · 16 of 47 positions shown, 18 images · non-contrast
Comparison: None.

CLINICAL DATA: KLPIGBB fell on face at work today, no sink

EXAM:
CT HEAD WITHOUT CONTRAST
TECHNIQUE: Contiguous axial images were obtained from the base of the skull
through the vertex without intravenous contrast.

[Series 3: head wo · axial · 0.39mm/px · z∈[-142,-32]mm · 7 of 30 slices shown, 9 images]
[im 4/30  brain]
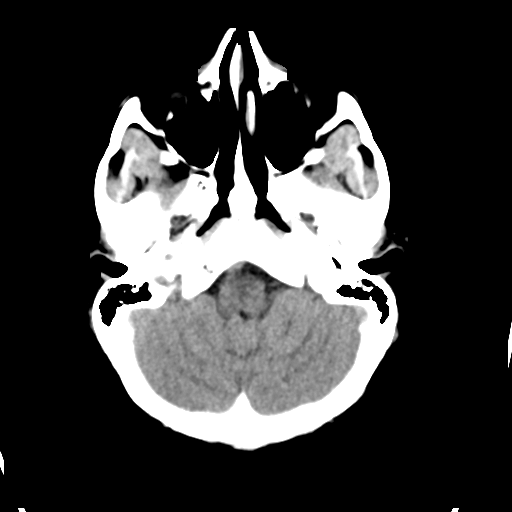
[im 4/30  bone]
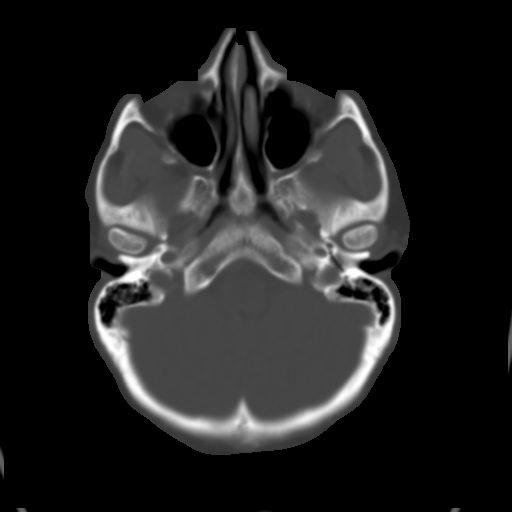
[im 8/30  brain]
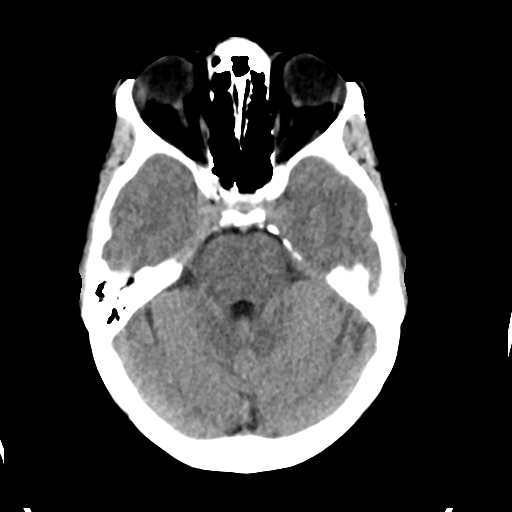
[im 11/30  brain]
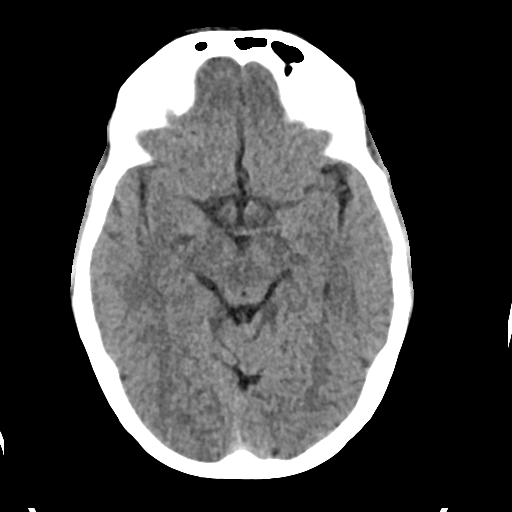
[im 15/30  brain]
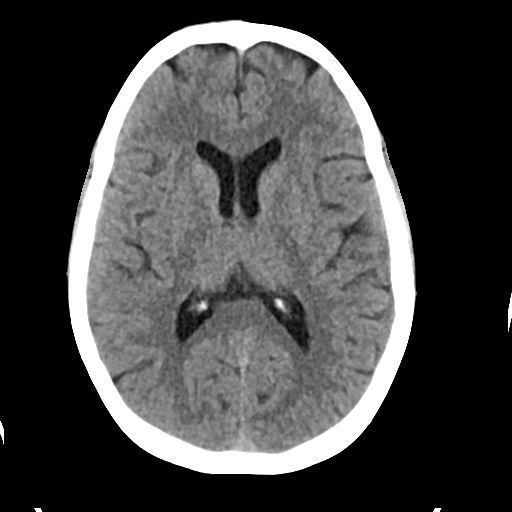
[im 19/30  brain]
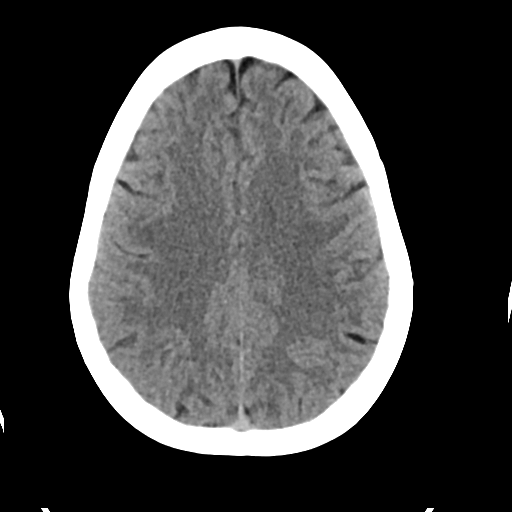
[im 19/30  bone]
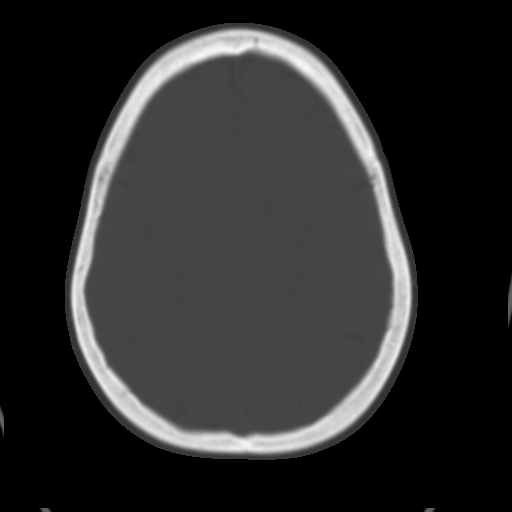
[im 22/30  brain]
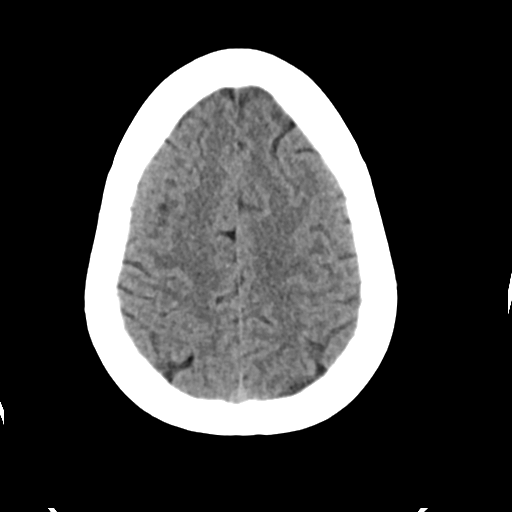
[im 26/30  brain]
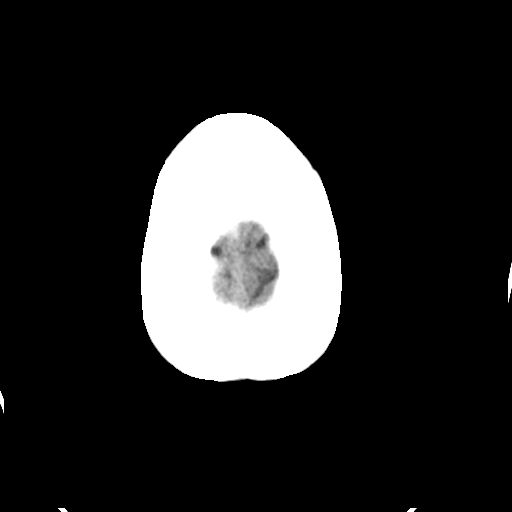

[Series 4: head bone · axial · 0.39mm/px · z∈[-143,-115]mm · 3 of 74 slices shown]
[im 8/74  bone]
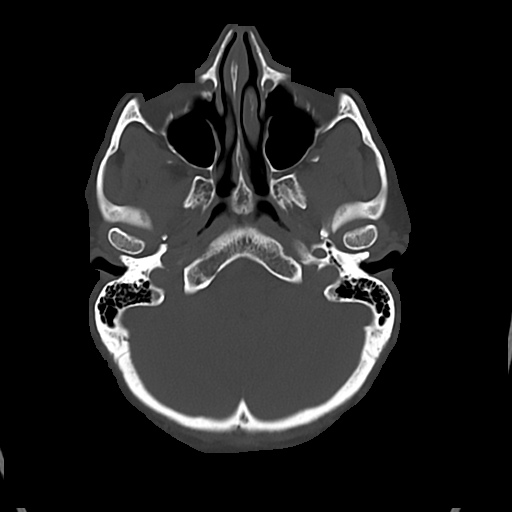
[im 15/74  bone]
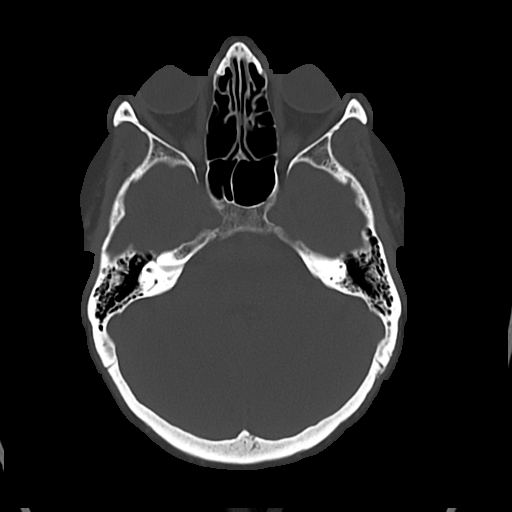
[im 22/74  bone]
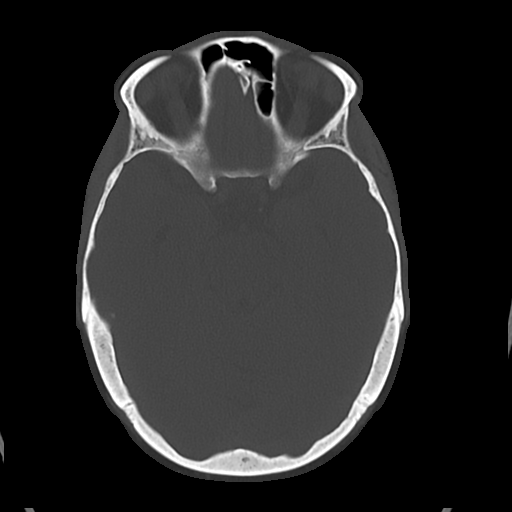

[Series 5: cor soft · coronal · 0.26mm/px · 3 of 64 slices shown]
[im 22/64  brain]
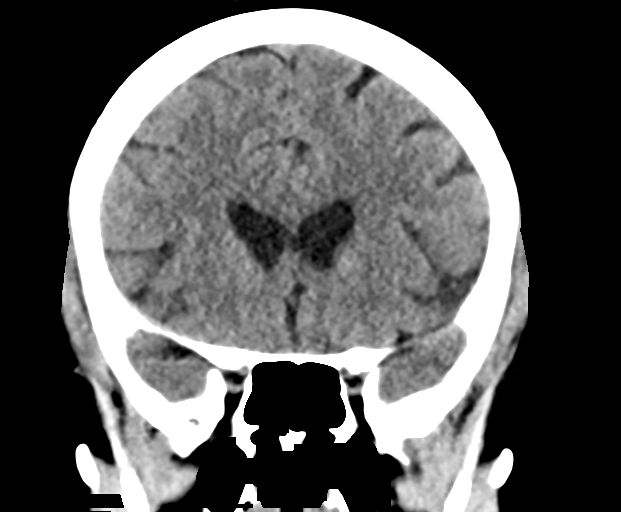
[im 29/64  brain]
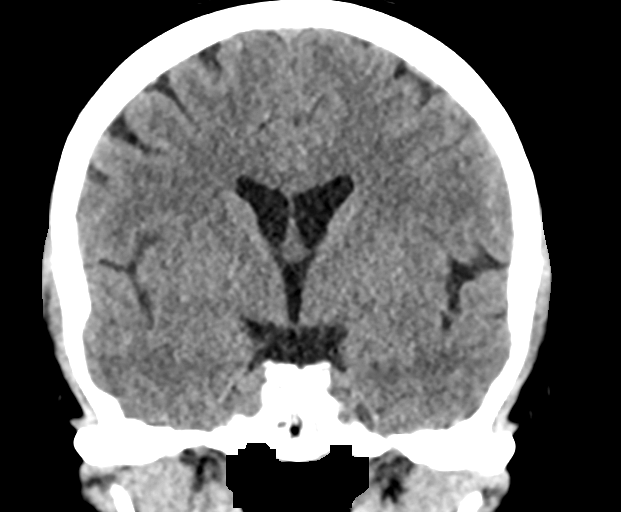
[im 36/64  brain]
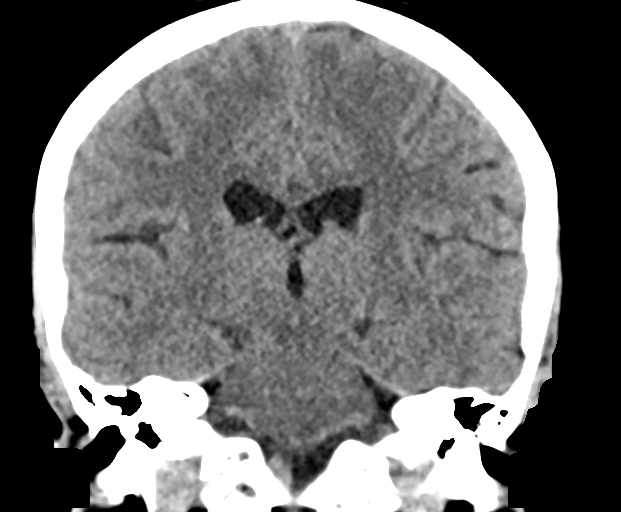

[Series 6: sag soft · sagittal · 0.26mm/px · 3 of 54 slices shown]
[im 18/54  brain]
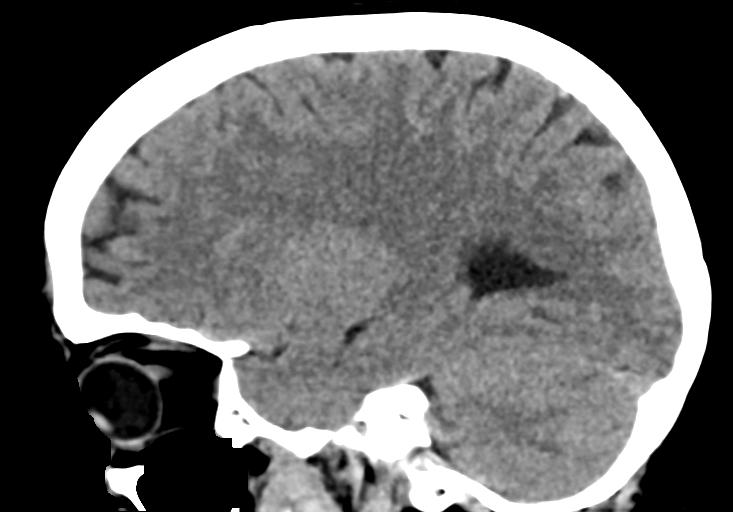
[im 27/54  brain]
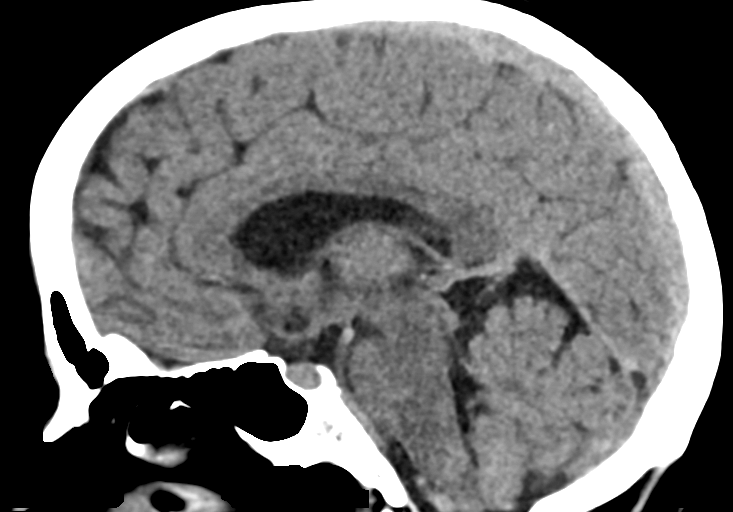
[im 36/54  brain]
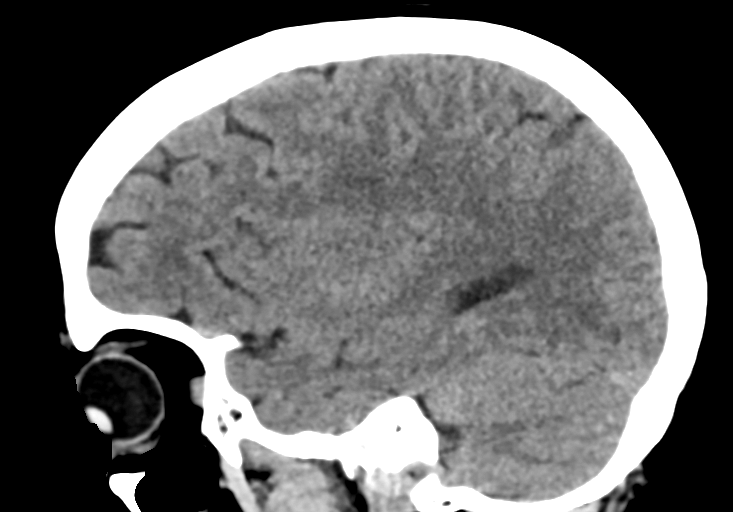

[16 of 47 positions shown; findings below may reference images not displayed]

FINDINGS: Brain: No evidence of acute infarction, hemorrhage, hydrocephalus,
extra-axial collection or mass lesion/mass effect.

Vascular: No hyperdense vessel or unexpected calcification.

Skull: Extra calvarial soft tissue swelling over the right frontal
bone without evidence of underlying calvarial fracture.

Sinuses/Orbits: No acute finding.

Other: None.
IMPRESSION: 1. No acute intracranial findings.
2. Extra calvarial soft tissue swelling over the right frontal bone
without evidence of underlying calvarial fracture.

## 2021-01-30 IMAGING — DX DG CERVICAL SPINE COMPLETE 4+V
6 series · 6 of 6 positions shown · non-contrast
Comparison: None.

CLINICAL DATA: shelf fell and hit her on the forehead, denies LOC,
no open wound, small knot noted to forehead. Pt took tylenol around
[AD] but reports worsening headache.

EXAM:
CERVICAL SPINE - COMPLETE 4+ VIEW

[c-spine lat]
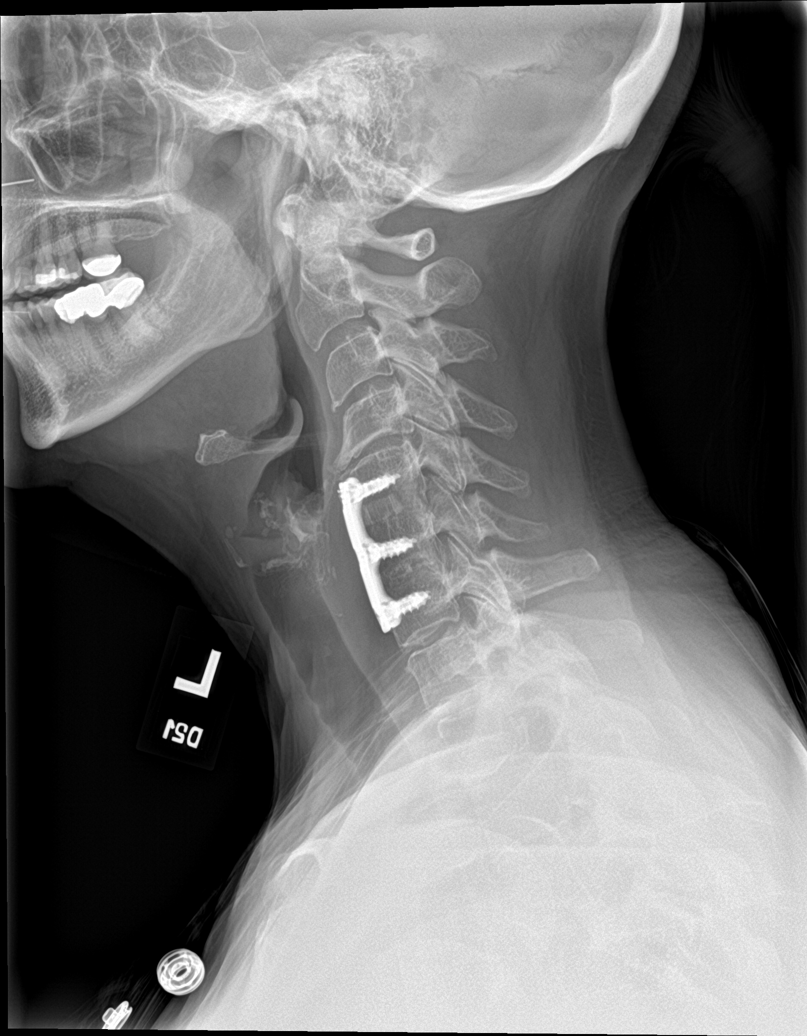

[c-spine obl (1 of 2)]
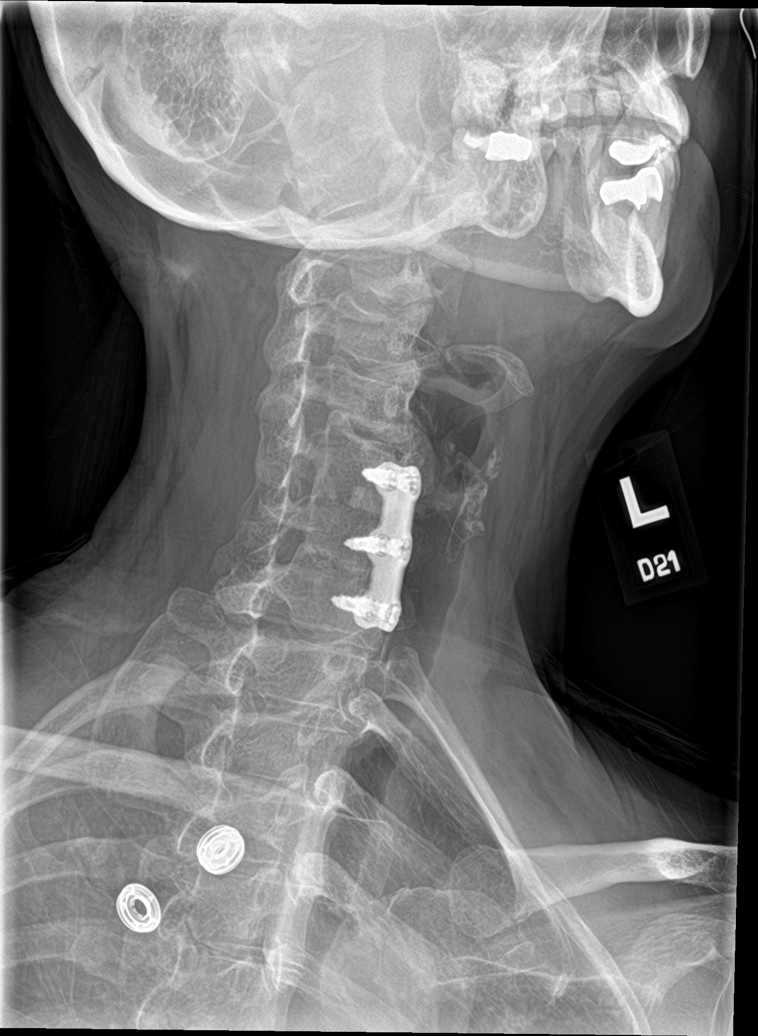

[c-spine obl (2 of 2)]
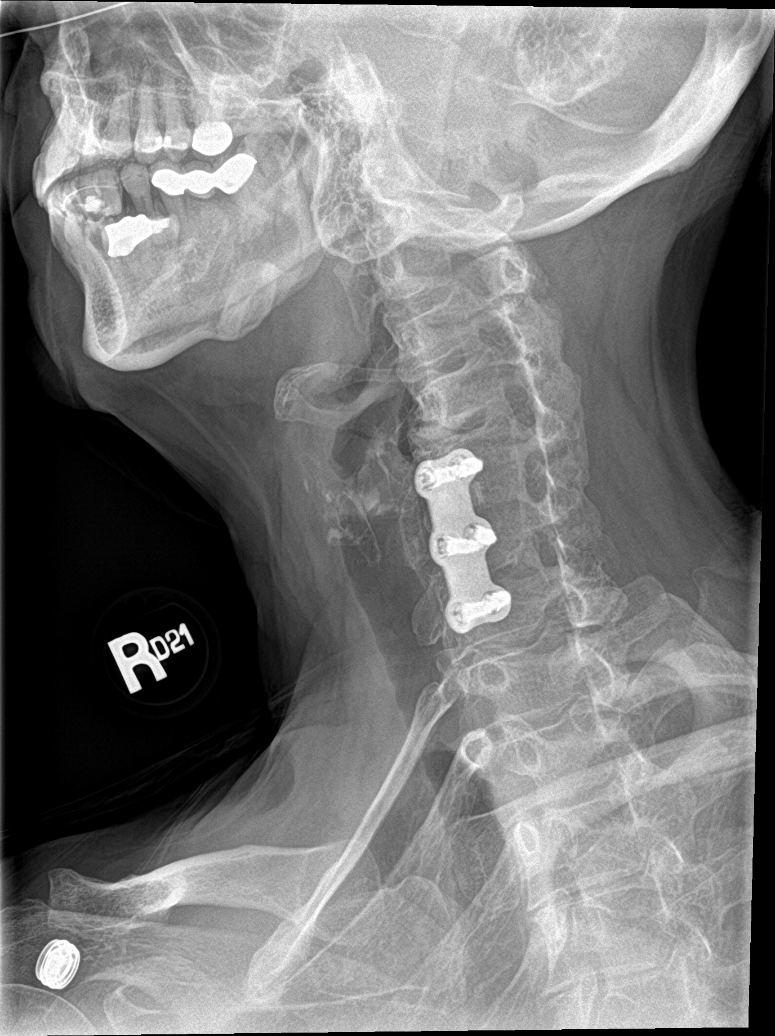

[c-spine ap]
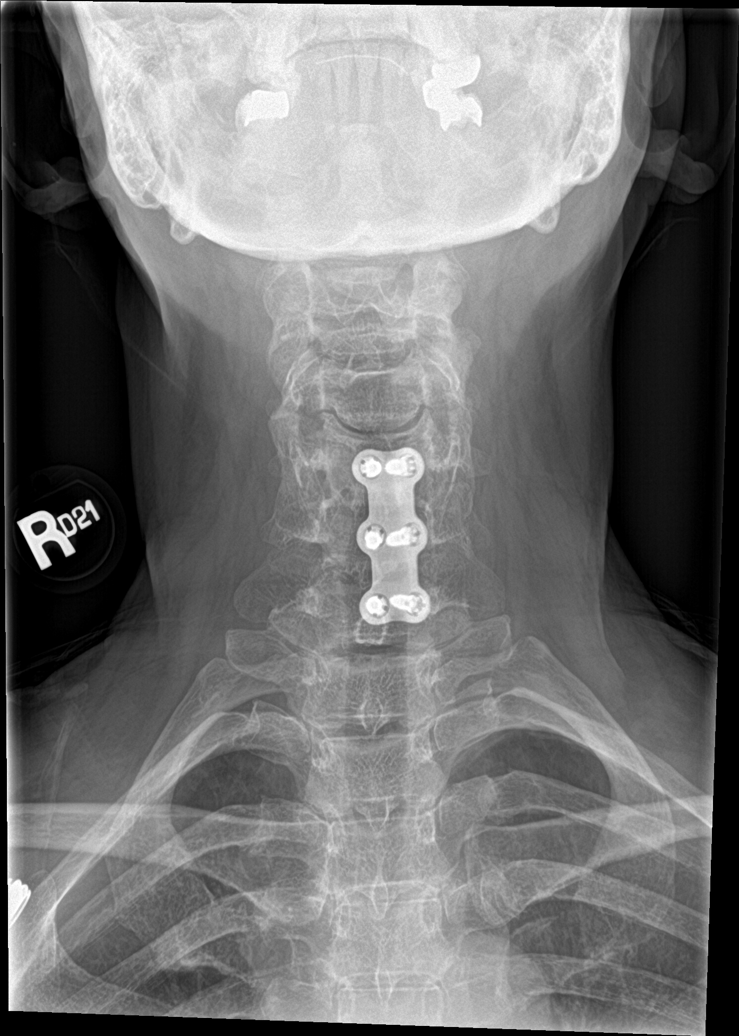

[c-spine open mouth (1 of 2)]
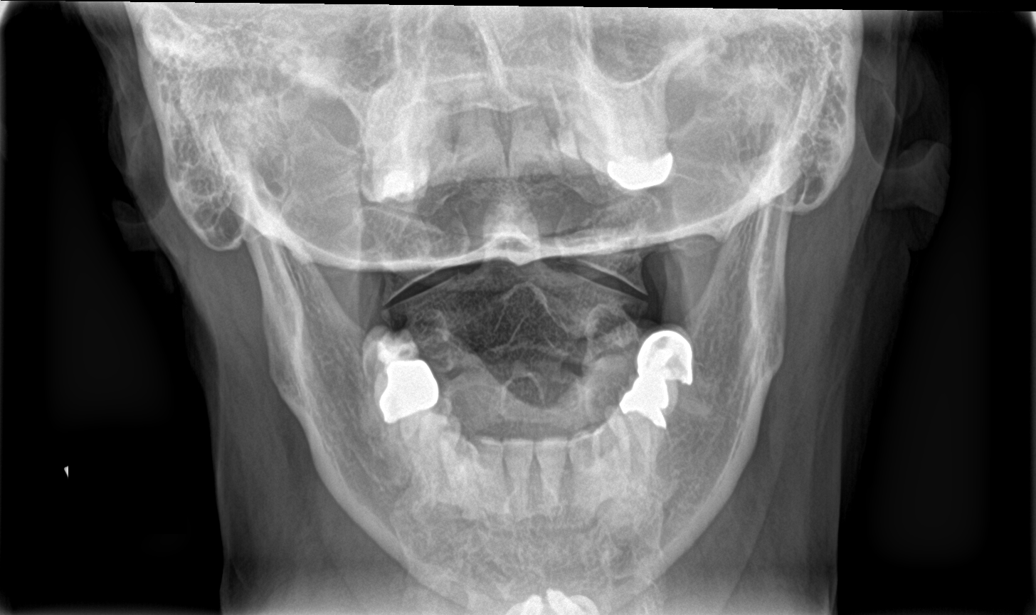

[c-spine open mouth (2 of 2)]
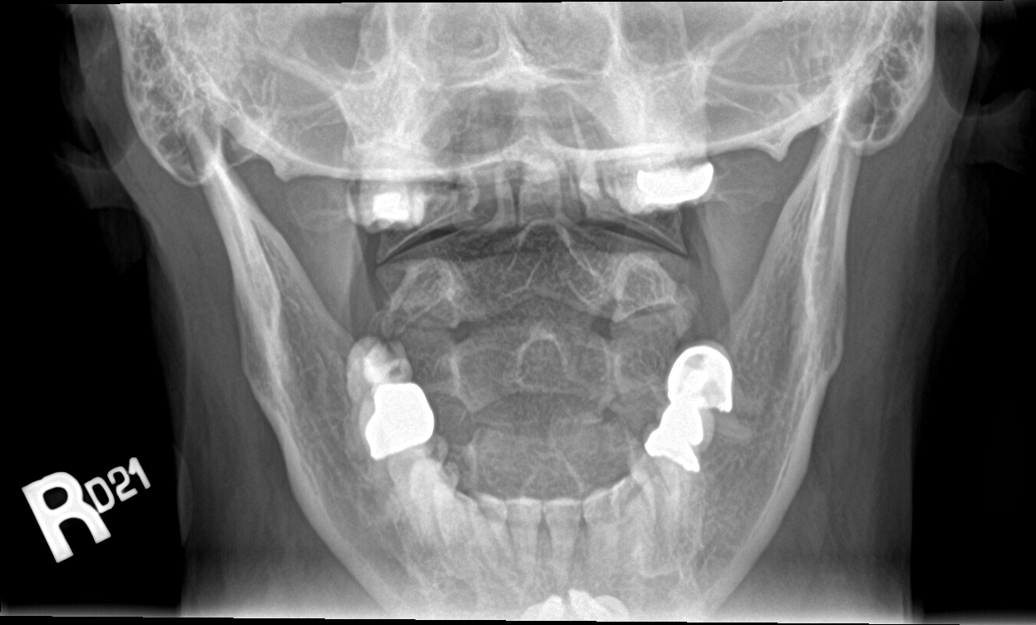

[6 of 6 positions shown; findings below may reference images not displayed]

FINDINGS: Seven cervical type vertebral bodies are visualized on the lateral
views. Prior anterior cervical fusion and discectomy from C4-C6 with
bony ankylosis across the vertebral bodies at this level and no
evidence of hardware complication. There is no evidence of cervical
spine fracture or prevertebral soft tissue swelling. Alignment is
normal. No other significant bone abnormalities are identified.
Dental hardware.
IMPRESSION: Negative cervical spine radiographs.

C4-C6 anterior cervical fusion hardware without evidence of hardware
complication

## 2021-01-30 MED ORDER — IBUPROFEN 600 MG PO TABS
600.0000 mg | ORAL_TABLET | Freq: Four times a day (QID) | ORAL | 0 refills | Status: DC | PRN
Start: 2021-01-30 — End: 2021-01-30

## 2021-01-30 MED ORDER — ONDANSETRON HCL 4 MG PO TABS
4.0000 mg | ORAL_TABLET | Freq: Three times a day (TID) | ORAL | 0 refills | Status: DC | PRN
Start: 2021-01-30 — End: 2021-01-30

## 2021-01-30 MED ORDER — IBUPROFEN 800 MG PO TABS
800.0000 mg | ORAL_TABLET | Freq: Once | ORAL | Status: AC
  Administered 2021-01-30: 800 mg via ORAL
  Filled 2021-01-30: qty 1

## 2021-01-30 MED ORDER — ONDANSETRON 4 MG PO TBDP
4.0000 mg | ORAL_TABLET | Freq: Once | ORAL | Status: AC
  Administered 2021-01-30: 4 mg via ORAL
  Filled 2021-01-30: qty 1

## 2021-01-30 NOTE — ED Notes (Signed)
Pt given crackers and sprite per PA

## 2021-01-30 NOTE — ED Provider Notes (Signed)
[place] EMERGENCY DEPARTMENT Provider Note   CSN: [id number] Arrival date & time: 01/30/21  1327     History Chief Complaint  Patient presents with  . Head Injury    [name] is a 60 y.o. female.  The history is provided by the patient and medical records. No language interpreter was used.  Head Injury    60 year old female significant history of osteopenia, cervical spine arthritis currently on postmenopausal hormone therapy who presents for evaluation of head injury.  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  She was in the medication room retrieving some medication when a loose wooden shelf fell down and struck her on her forehead.  Incident happened just prior to arrival.  Patient reported cute onset of sharp throbbing pain to her forehead which has become increasingly more unbearable and wraps around her forehead.  She endorsed some light sensitivity.  Furthermore patient also complains of neck pain and feeling very nauseous.  She has not vomited.  She did had some Tylenol prior to arrival with minimal improvement.  Pain is persistent marked has toward severe.  No report of any focal numbness or focal weakness.  Patient request for imaging of her head and cervical spine.  History reviewed. No pertinent past medical history.  Patient Active Problem List   Diagnosis Date Noted  . History of uterine fibroid 01/05/2021  . Herpes 07/12/2020  . GERD (gastroesophageal reflux disease) 05/23/2020  . Vaginal atrophy 05/23/2020  . Cervical spine arthritis 05/23/2020  . Osteopenia 05/23/2020  . Insomnia 05/20/2020  . Postmenopausal hormone therapy 05/20/2020    Past Surgical History:  Procedure Laterality Date  . AUGMENTATION MAMMAPLASTY Bilateral 2010  . CERVICAL SPINE SURGERY  2003   C5-C7- double dissectomy  . PAROTID GLAND TUMOR EXCISION       OB History   No obstetric history on file.     Family History  Problem Relation Age of  Onset  . Lung cancer Mother   . Thyroid disease Mother   . Hypertension Mother   . Hyperlipidemia Mother   . CAD Mother        stent x 1  . Osteoporosis Mother   . Leukemia Father 65  . Rheum arthritis Sister   . Melanoma Sister   . Osteopenia Sister   . Autoimmune disease Daughter   . Colon cancer Maternal Uncle   . Heart disease Maternal Grandmother   . Colon cancer Maternal Grandfather   . Heart disease Maternal Grandfather   . Stroke Maternal Grandfather   . Heart disease Paternal Grandfather     Social History   Tobacco Use  . Smoking status: Never Smoker  . Smokeless tobacco: Never Used  Vaping Use  . Vaping Use: Never used  Substance Use Topics  . Alcohol use: Yes    Comment: ocassional  . Drug use: Never    Home Medications Prior to Admission medications   Medication Sig Start Date End Date Taking? Authorizing Provider  Biotin 5 MG CAPS Take 1 capsule by mouth in the morning and at bedtime.    [provider]  Cholecalciferol 50 MCG (2000 UT) CAPS Take 1 capsule by mouth daily.    [provider]  esomeprazole (NEXIUM) 20 MG capsule Take 1 capsule by mouth daily.    [provider]  melatonin 3 MG TABS tablet Take 3 mg by mouth at bedtime.    [provider]  metaxalone (SKELAXIN) 800 MG tablet Take  1 tablet by mouth at bedtime. prn 09/25/14   [provider]  nitrofurantoin, macrocrystal-monohydrate, (MACROBID) 100 MG capsule Take 1 tablet (100 MG) by mouth only after intercourse. 05/23/20   Marnee Guarneri T, NP  Probiotic Product (ALIGN PO) Take 1 tablet by mouth daily.    [provider]  progesterone (PROMETRIUM) 200 MG capsule Take 1 capsule by mouth at bedtime. 11/14/14   [provider]  testosterone cypionate (DEPOTESTOSTERONE CYPIONATE) 200 MG/ML injection Inject 200 mg into the muscle every 28 (twenty-eight) days. 02/06/20   [provider]  traZODone (DESYREL) 50 MG tablet Take 1  tablet (50 mg total) by mouth at bedtime. 10/04/20   Cannady, Henrine Screws T, NP  valACYclovir (VALTREX) 1000 MG tablet Take one tablet by mouth once daily for total of 5 days as needed for outbreaks. 07/12/20   Cannady, Henrine Screws T, NP  YUVAFEM 10 MCG TABS vaginal tablet Place 1 tablet vaginally 2 (two) times a week. 05/11/20   [provider]    Allergies    Patient has no known allergies.  Review of Systems   Review of Systems  All other systems reviewed and are negative.   Physical Exam Updated Vital Signs BP (!) 148/79   Pulse 86   Temp 98.2 F (36.8 C) (Oral)   Resp 18   SpO2 100%   Physical Exam Vitals and nursing note reviewed.  Constitutional:      Appearance: She is well-developed and well-nourished.     Comments: Patient appears tearful, holding her forehead.  HENT:     Head: Normocephalic.     Comments: Tenderness to right forehead with small ecchymosis noted but no crepitus.  No raccoon's eyes, no battle sign.  No midface tenderness. Eyes:     Extraocular Movements: Extraocular movements intact.     Conjunctiva/sclera: Conjunctivae normal.     Pupils: Pupils are equal, round, and reactive to light.  Cardiovascular:     Rate and Rhythm: Normal rate and regular rhythm.     Pulses: Normal pulses.     Heart sounds: Normal heart sounds.  Pulmonary:     Effort: Pulmonary effort is normal.     Breath sounds: Normal breath sounds.  Musculoskeletal:     Cervical back: Normal range of motion and neck supple. Tenderness (Mild bilateral cervical spinal muscle tenderness without significant midline spine tenderness.) present. No rigidity.  Skin:    Findings: No rash.  Neurological:     Mental Status: She is alert and oriented to person, place, and time.     GCS: GCS eye subscore is 4. GCS verbal subscore is 5. GCS motor subscore is 6.     Cranial Nerves: Cranial nerves are intact.     Sensory: Sensation is intact.     Motor: Motor function is intact.     Gait: Gait is  intact.  Psychiatric:        Mood and Affect: Mood and affect normal.     ED Results / Procedures / Treatments   Labs (all labs ordered are listed, but only abnormal results are displayed) Labs Reviewed - No data to display  EKG None  Radiology DG Cervical Spine Complete  Result Date: 01/30/2021 CLINICAL DATA:  shelf fell and hit her on the forehead, denies LOC, no open wound, small knot noted to forehead. Pt took tylenol around 120pm but reports worsening headache. EXAM: CERVICAL SPINE - COMPLETE 4+ VIEW COMPARISON:  None. FINDINGS: Seven cervical type vertebral bodies are visualized on  the lateral views. Prior anterior cervical fusion and discectomy from C4-C6 with bony ankylosis across the vertebral bodies at this level and no evidence of hardware complication. There is no evidence of cervical spine fracture or prevertebral soft tissue swelling. Alignment is normal. No other significant bone abnormalities are identified. Dental hardware. IMPRESSION: Negative cervical spine radiographs. C4-C6 anterior cervical fusion hardware without evidence of hardware complication Electronically Signed   By: Dahlia Bailiff MD   On: 01/30/2021 15:14   CT Head Wo Contrast  Result Date: 01/30/2021 CLINICAL DATA:  Darrick Grinder fell on face at work today, no sink EXAM: CT HEAD WITHOUT CONTRAST TECHNIQUE: Contiguous axial images were obtained from the base of the skull through the vertex without intravenous contrast. COMPARISON:  None. FINDINGS: Brain: No evidence of acute infarction, hemorrhage, hydrocephalus, extra-axial collection or mass lesion/mass effect. Vascular: No hyperdense vessel or unexpected calcification. Skull: Extra calvarial soft tissue swelling over the right frontal bone without evidence of underlying calvarial fracture. Sinuses/Orbits: No acute finding. Other: None. IMPRESSION: 1. No acute intracranial findings. 2. Extra calvarial soft tissue swelling over the right frontal bone without evidence of  underlying calvarial fracture. Electronically Signed   By: Dahlia Bailiff MD   On: 01/30/2021 16:40    Procedures Procedures   Medications Ordered in ED Medications  ibuprofen (ADVIL) tablet 800 mg (has no administration in time range)  ondansetron (ZOFRAN-ODT) disintegrating tablet 4 mg (4 mg Oral Given 01/30/21 1547)    ED Course  I have reviewed the triage vital signs and the nursing notes.  Pertinent labs & imaging results that were available during my care of the patient were reviewed by me and considered in my medical decision making (see chart for details).    MDM Rules/Calculators/A&P                          BP (!) 148/79   Pulse 86   Temp 98.2 F (36.8 C) (Oral)   Resp 18   SpO2 100%   Final Clinical Impression(s) / ED Diagnoses Final diagnoses:  Minor head injury, initial encounter  Concussion without loss of consciousness, initial encounter    Rx / DC Orders ED Discharge Orders         Ordered    ibuprofen (ADVIL) 600 MG tablet  Every 6 hours PRN        01/30/21 1700    ondansetron (ZOFRAN) 4 MG tablet  Every 8 hours PRN        01/30/21 1700         3:01 PM Patient here with injury to her forehead when a wooden shelf fell and struck her forehead just prior to arrival.  She does exhibit some concussive symptoms.  Low suspicion for intracranial injury or cervical spine injury.  Per request, will obtain head CT scan and x-ray of the cervical spine.  Zofran given for nausea.  5:03 PM Fortunately head CT scan without any acute intracranial finding.  Soft tissue swelling noted to the frontal bone without evidence of fracture.  X-ray of the cervical spine without acute finding.  Recommend rice therapy.  Concussion care discussed.  Work note provided.  Patient stable for discharge.   Domenic Moras, PA-C 01/30/21 1703    Blanchie Dessert, MD 02/04/21 1723

## 2021-01-30 NOTE — ED Triage Notes (Signed)
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 Pt took tylenol around 120pm but reports worsening headache.

## 2021-01-30 NOTE — Discharge Instructions (Addendum)
You have been evaluated for your head injury.  Fortunately CT scan and x-ray did not show any acute fracture or dislocation or any traumatic brain injury.  You do have symptoms systems with concussion.  Continue to apply ice intermittently to the forehead for comfort.  Take ibuprofen and Zofran as needed.  Follow-up with your doctor for further care.

## 2021-02-22 DIAGNOSIS — S93401A Sprain of unspecified ligament of right ankle, initial encounter: Secondary | ICD-10-CM | POA: Diagnosis not present

## 2021-02-25 DIAGNOSIS — Q667 Congenital pes cavus, unspecified foot: Secondary | ICD-10-CM | POA: Insufficient documentation

## 2021-02-25 DIAGNOSIS — M65861 Other synovitis and tenosynovitis, right lower leg: Secondary | ICD-10-CM | POA: Diagnosis not present

## 2021-03-11 ENCOUNTER — Ambulatory Visit: Payer: 59 | Admitting: Orthopedic Surgery

## 2021-03-11 ENCOUNTER — Ambulatory Visit: Payer: Self-pay

## 2021-03-11 ENCOUNTER — Other Ambulatory Visit: Payer: Self-pay

## 2021-03-11 DIAGNOSIS — M25571 Pain in right ankle and joints of right foot: Secondary | ICD-10-CM | POA: Diagnosis not present

## 2021-03-11 DIAGNOSIS — M25511 Pain in right shoulder: Secondary | ICD-10-CM

## 2021-03-12 ENCOUNTER — Encounter: Payer: Self-pay | Admitting: Orthopedic Surgery

## 2021-03-12 ENCOUNTER — Telehealth: Payer: Self-pay | Admitting: Orthopedic Surgery

## 2021-03-12 NOTE — Telephone Encounter (Signed)
See below. Please advise.  

## 2021-03-12 NOTE — Telephone Encounter (Signed)
There is not enough documentation medically on the shoulder to get the scan past the insurance company.  If she has a couple of weeks and its not getting better then we could potentially order the scan at that point but for now we need to hold off because they would never approve the scan with what we have.  Alternatively if she wants to come in and let Lurena Joiner look at it on Friday we could do that as well but the plan and we left was to see how it does in the time it takes to get the MRI scan on the ankle and then recheck it when she comes back and then proceed from there.  Also I do not think we have plain x-rays on that shoulder.  That also stumbling block.

## 2021-03-12 NOTE — Telephone Encounter (Signed)
I spoke with pt this morning after her call and she stated while in office yesterday she did address the shoulder and wants to see if Dr. [name] would add the MRI right shoulder to her other MRI. Pt also wants to go to  regional to have her scans. Please advise.

## 2021-03-12 NOTE — Telephone Encounter (Signed)
Patient called requesting her referral MRI be sent to an imaging place close to her home. Patient states to live in [place] and there is an imaging place behind [facility] and she will call back with the name. Patient also asked for Dr. [name] to add her shoulder into the MRI. Explained to patient that if she was not seen for her shoulder it is a possibility she will have to set an separate appt for her shoulder pain. Please call patient about this matter at [phone].

## 2021-03-12 NOTE — Progress Notes (Signed)
Office Visit Note   Patient: [name]           Date of Birth: 02-17-1961           MRN: [id number] Visit Date: 03/11/2021 Requested by: Venita Lick, NP [place],  [place] [address] PCP: Venita Lick, NP  Subjective: Chief Complaint  Patient presents with  . Right Ankle - Pain    HPI: Patient presents for evaluation of right ankle pain.  Denies any discrete history of injury but does report going up the stairs about 3 weeks ago and having a stinging sensation on the medial aspect of the ankle.  Has had significant pain and inability to weight-bear since that time.  She has been using a knee scooter as well as a fracture boot.  She works as a Marine scientist.  She likes to walk for recreation.  She has improved marginally and being able to step down in the fracture boot from a stair.  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  Does take Motrin.  Works about 2 shifts per week.  She also reports a right shoulder injury when she fell on it several days ago.  She wants to hold off on working that up until her next clinic visit.  She reports that shooting medial pain when she is walking up the stairs.              ROS: All systems reviewed are negative as they relate to the chief complaint within the history of present illness.  Patient denies  fevers or chills.   Assessment & Plan: Visit Diagnoses:  1. Pain in right ankle and joints of right foot   2. Right shoulder pain, unspecified chronicity     Plan: Impression is right shoulder pain 2 days duration following a fall which may get better on its own.  We will examine that and more thoroughly work that up at the next clinic visit.  More problematic for Aurorah at this time is her right ankle issue.  She does have medial malleolar tenderness as well as tenderness over the posterior tib tendon.  Interestingly she has full function of the posterior tib tendon in  terms of being able to stand up on 1 foot with appropriate heel inversion on the right-hand side.  Ultrasound examination demonstrates structural competency of the posterior tib tendon from about 4 cm below the malleolus to 8 cm above the malleolus.  Plain radiographs done at another facility were by report normal.  Plan at this time is MRI scan of that right ankle to evaluate for stress reaction versus partial tear of the posterior tib tendon.  Again her objective history and physical exam findings do not really match up.  She does have exquisite tenderness posterior to the malleolus.  I think it is possible this could be some type of stress reaction in the bone versus partial tearing of the posterior tib tendon.  Nonetheless we need further imaging to make a definitive recommendation about return to work and functional ability.  Plan to see her back after that study.  She will need plain radiographs of the shoulder on the right-hand side on return.  Needs further work-up of the right shoulder as well following the fall.  Also we extended her disability until the 15th to allow for this work-up to be complete.  Follow-Up Instructions: Return for after MRI.  Orders:  Orders Placed This Encounter  Procedures  . XR Ankle Complete Right  . XR Shoulder Right  . MR Ankle Right w/o contrast   No orders of the defined types were placed in this encounter.     Procedures: No procedures performed   Clinical Data: No additional findings.  Objective: Vital Signs: There were no vitals taken for this visit.  Physical Exam:   Constitutional: Patient appears well-developed HEENT:  Head: Normocephalic Eyes:EOM are normal Neck: Normal range of motion Cardiovascular: Normal rate Pulmonary/chest: Effort normal Neurologic: Patient is alert Skin: Skin is warm Psychiatric: Patient has normal mood and affect    Ortho Exam: Ortho exam demonstrates antalgic gait to the right.  She does have tenderness  on the medial malleolus as well as on the posterior tib tendon just proximal to the malleolus.  5 out of 5 ankle dorsiflexion plantarflexion inversion eversion strength bilaterally.  No appreciable swelling is present on that medial side of the on the right compared to the left.  No pain with pronation supination of the forefoot or with transverse tarsal or tibiotalar range of motion passively.  She does have heel inversion when standing on her toes bilaterally.  Ultrasound examination of the posterior tib tendon demonstrates no obvious structural defects.  Specialty Comments:  No specialty comments available.  Imaging: No results found.   PMFS History: Patient Active Problem List   Diagnosis Date Noted  . History of uterine fibroid 01/05/2021  . Herpes 07/12/2020  . GERD (gastroesophageal reflux disease) 05/23/2020  . Vaginal atrophy 05/23/2020  . Cervical spine arthritis 05/23/2020  . Osteopenia 05/23/2020  . Insomnia 05/20/2020  . Postmenopausal hormone therapy 05/20/2020   History reviewed. No pertinent past medical history.  Family History  Problem Relation Age of Onset  . Lung cancer Mother   . Thyroid disease Mother   . Hypertension Mother   . Hyperlipidemia Mother   . CAD Mother        stent x 1  . Osteoporosis Mother   . Leukemia Father 36  . Rheum arthritis Sister   . Melanoma Sister   . Osteopenia Sister   . Autoimmune disease Daughter   . Colon cancer Maternal Uncle   . Heart disease Maternal Grandmother   . Colon cancer Maternal Grandfather   . Heart disease Maternal Grandfather   . Stroke Maternal Grandfather   . Heart disease Paternal Grandfather     Past Surgical History:  Procedure Laterality Date  . AUGMENTATION MAMMAPLASTY Bilateral 2010  . CERVICAL SPINE SURGERY  2003   C5-C7- double dissectomy  . PAROTID GLAND TUMOR EXCISION     Social History   Occupational History  . Not on file  Tobacco Use  . Smoking status: Never Smoker  . Smokeless  tobacco: Never Used  Vaping Use  . Vaping Use: Never used  Substance and Sexual Activity  . Alcohol use: Yes    Comment: ocassional  . Drug use: Never  . Sexual activity: Yes

## 2021-03-13 ENCOUNTER — Telehealth: Payer: Self-pay

## 2021-03-13 NOTE — Telephone Encounter (Signed)
See other note sent to [facility].

## 2021-03-13 NOTE — Telephone Encounter (Signed)
Patient called she is requesting a referral to be sent to [facility] for her ankle sent to [facility] asap call [phone]

## 2021-03-13 NOTE — Telephone Encounter (Signed)
I called pt back and informed her the order has been faxed to central scheduling where they will be the ones who will contact her to schedule, pt wanted number to call them, number was given

## 2021-03-13 NOTE — Telephone Encounter (Signed)
See below. I talked with patient. She said she had talked with you about getting MRI of her ankle changed over to [facility]? She is calling back today stating no one has called her to schedule. Can you please confirm with her this has been changed? She stated she would like to get this done ASAP so she can try to return back to work.

## 2021-03-17 ENCOUNTER — Other Ambulatory Visit: Payer: Self-pay

## 2021-03-17 ENCOUNTER — Ambulatory Visit
Admission: RE | Admit: 2021-03-17 | Discharge: 2021-03-17 | Disposition: A | Discharge status: Home / Self Care | Payer: 59 | Source: Ambulatory Visit | Attending: Orthopedic Surgery | Admitting: Orthopedic Surgery

## 2021-03-17 DIAGNOSIS — M65871 Other synovitis and tenosynovitis, right ankle and foot: Secondary | ICD-10-CM | POA: Diagnosis not present

## 2021-03-17 DIAGNOSIS — M25571 Pain in right ankle and joints of right foot: Secondary | ICD-10-CM | POA: Insufficient documentation

## 2021-03-17 DIAGNOSIS — S86811A Strain of other muscle(s) and tendon(s) at lower leg level, right leg, initial encounter: Secondary | ICD-10-CM | POA: Diagnosis not present

## 2021-03-17 DIAGNOSIS — M76821 Posterior tibial tendinitis, right leg: Secondary | ICD-10-CM | POA: Diagnosis not present

## 2021-03-17 DIAGNOSIS — M19071 Primary osteoarthritis, right ankle and foot: Secondary | ICD-10-CM | POA: Diagnosis not present

## 2021-03-17 IMAGING — MR MR ANKLE*R* W/O CM
6 series · 40 of 40 positions shown · non-contrast
Comparison: None.

CLINICAL DATA: Medial right ankle pain for the past few weeks. No
injury or prior surgery.

EXAM:
MRI OF THE RIGHT ANKLE WITHOUT CONTRAST
TECHNIQUE: Multiplanar, multisequence MR imaging of the ankle was performed. No
intravenous contrast was administered.

[Series 2: PD fat-sat · axial · right · 3.0mm · 0.50mm/px · z∈[-64,+76]mm · 8 of 36 slices shown]
[im 1/36]
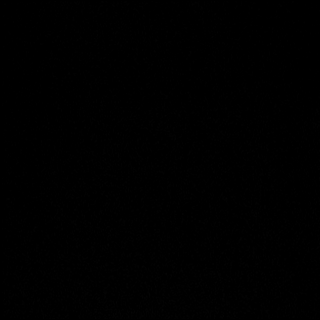
[im 6/36]
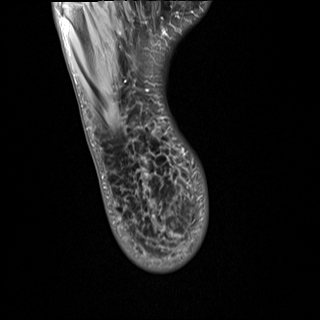
[im 11/36]
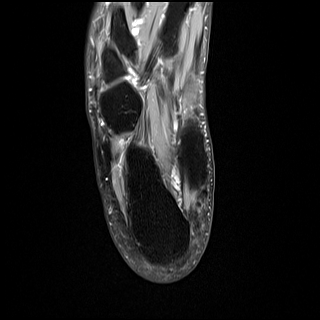
[im 16/36]
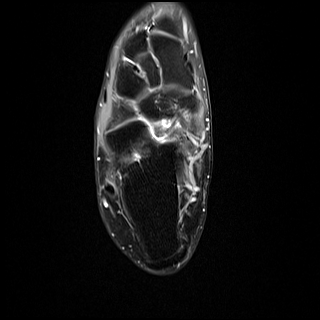
[im 21/36]
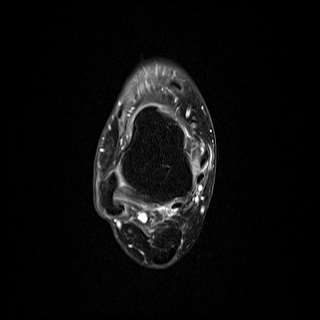
[im 26/36]
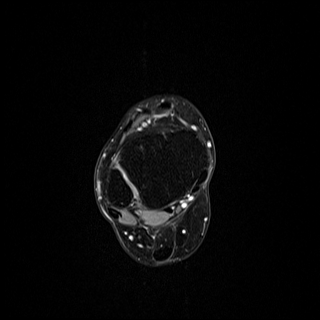
[im 31/36]
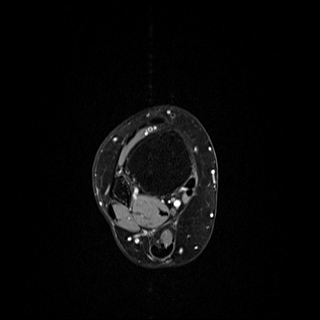
[im 36/36]
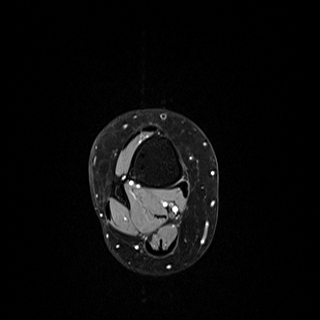

[Series 3: T2 fat-sat · axial · right · 3.0mm · 0.50mm/px · z∈[-64,+76]mm · 8 of 36 slices shown]
[im 1/36]
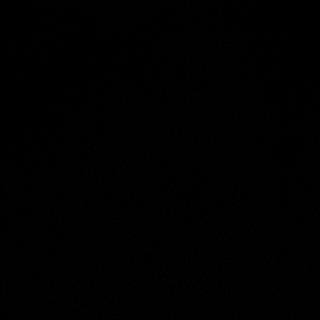
[im 6/36]
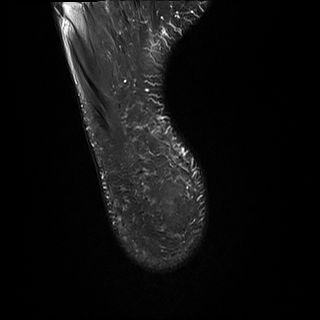
[im 11/36]
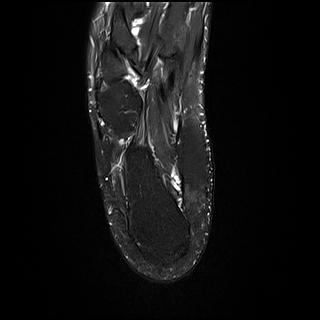
[im 16/36]
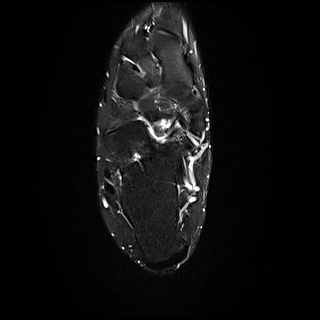
[im 21/36]
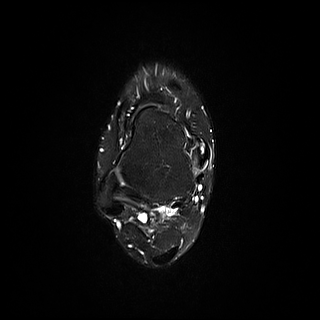
[im 26/36]
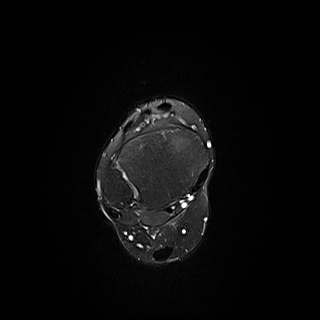
[im 31/36]
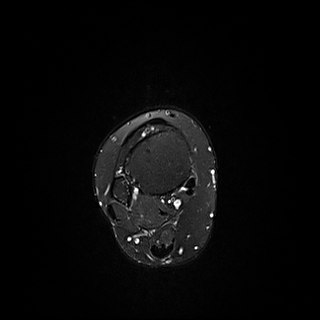
[im 36/36]
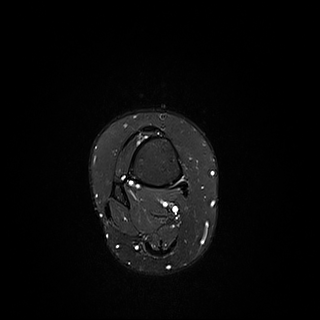

[Series 4: T2 · coronal · right · 3.0mm · 0.62mm/px · 8 of 40 slices shown (1 of 2)]
[im 1/40]
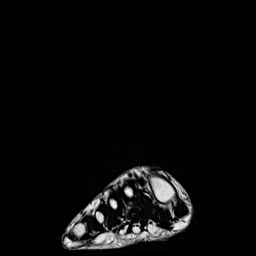
[im 6/40]
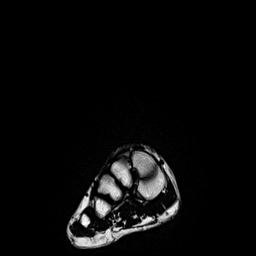
[im 12/40]
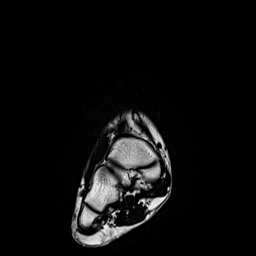
[im 17/40]
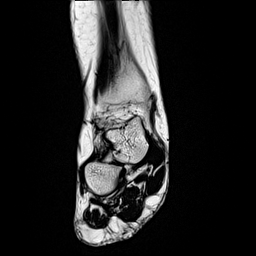
[im 23/40]
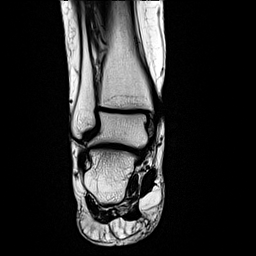
[im 28/40]
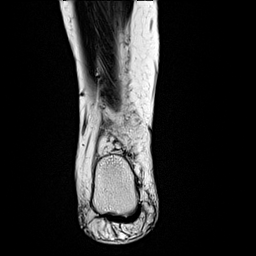
[im 34/40]
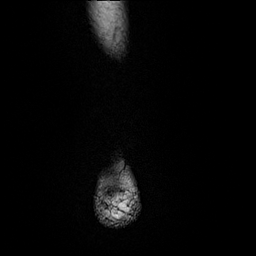
[im 40/40]
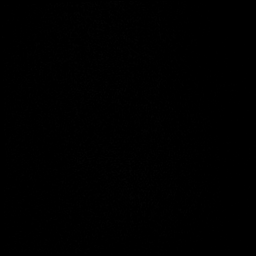

[Series 5: T2 · coronal · right · 3.0mm · 0.62mm/px · 8 of 40 slices shown (2 of 2)]
[im 1/40]
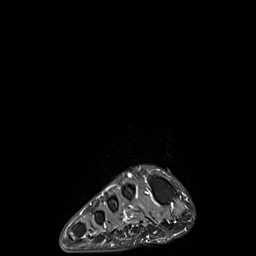
[im 6/40]
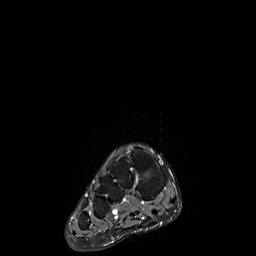
[im 12/40]
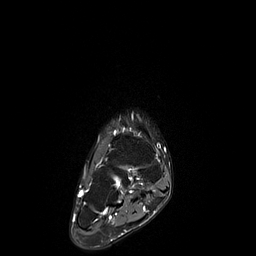
[im 17/40]
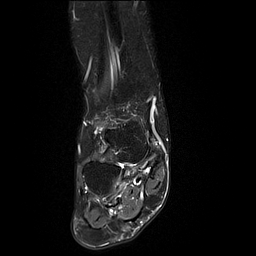
[im 23/40]
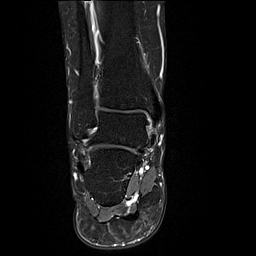
[im 28/40]
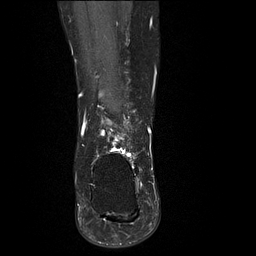
[im 34/40]
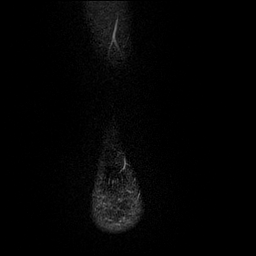
[im 40/40]
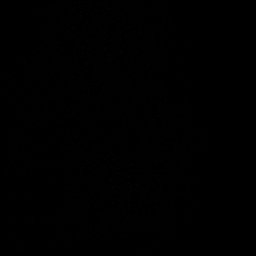

[Series 6: T1 · sagittal · right · 4.0mm · 0.70mm/px · 4 of 21 slices shown]
[im 1/21]
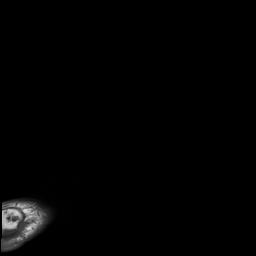
[im 7/21]
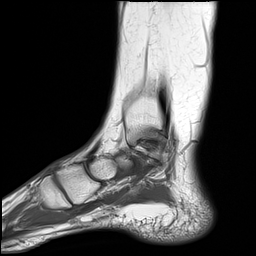
[im 14/21]
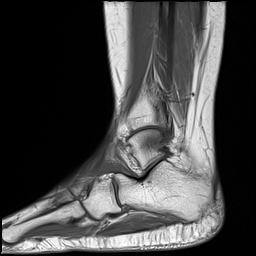
[im 21/21]
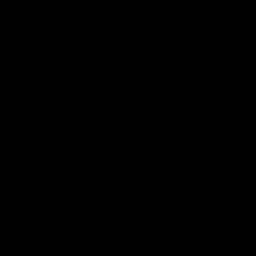

[Series 7: STIR · sagittal · right · 4.0mm · 0.35mm/px · 4 of 21 slices shown]
[im 1/21]
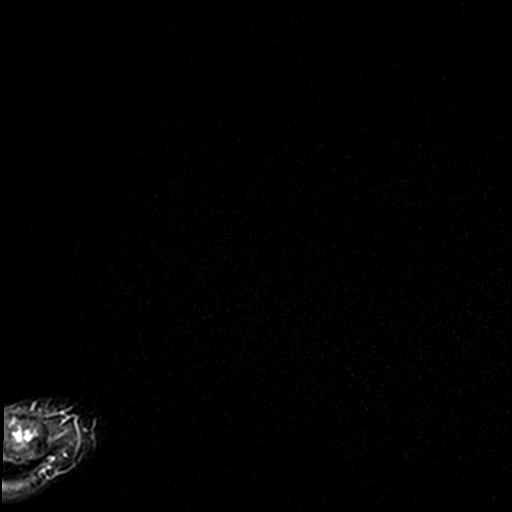
[im 7/21]
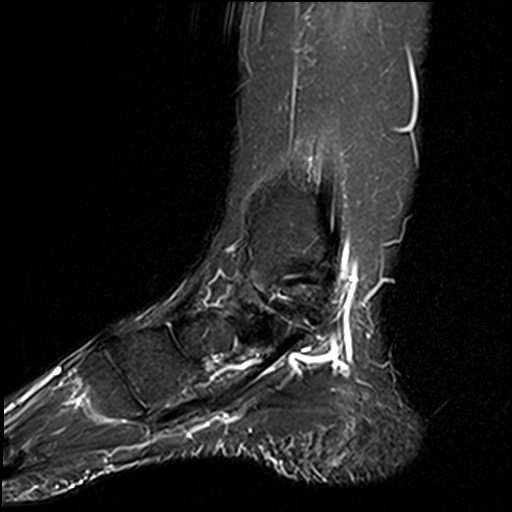
[im 14/21]
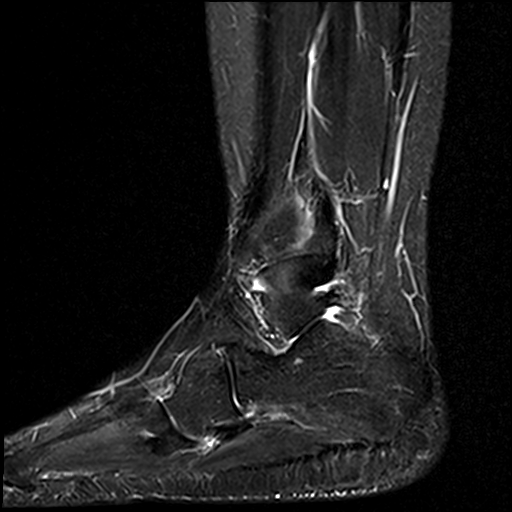
[im 21/21]
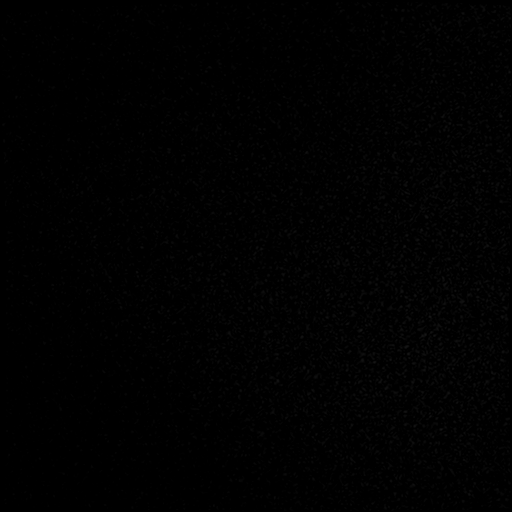

[40 of 40 positions shown; findings below may reference images not displayed]

FINDINGS: TENDONS

Peroneal: Peroneal longus tendon intact. Peroneal brevis intact.

Posteromedial: Distal posterior tibial tendinosis with small partial
tear and small amount of fluid in the tendon sheath (series 3,
images 17-18). Flexor digitorum longus tendon intact. Flexor
hallucis longus tendon intact.

Anterior: Tibialis anterior tendon intact. Extensor hallucis longus
tendon intact Extensor digitorum longus tendon intact.

Achilles:  Intact.

Plantar Fascia: Intact. Mild thickening of the proximal central band
without surrounding inflammatory changes, consistent with prior
plantar fasciitis.

LIGAMENTS

Lateral: Anterior talofibular ligament intact. Calcaneofibular
ligament intact. Posterior talofibular ligament intact. Anterior and
posterior tibiofibular ligaments intact.

Medial: Deltoid ligament intact. Spring ligament intact. Lisfranc
ligament intact.

CARTILAGE

Ankle Joint: No joint effusion. Normal ankle mortise. No chondral
defect.

Subtalar Joints/Sinus Tarsi: Normal subtalar joints. No subtalar
joint effusion. Normal sinus tarsi.

Bones: No suspicious marrow signal abnormality. No fracture or
dislocation. Mild third TMT joint osteoarthritis with focal
subchondral marrow edema in the lateral cuneiform.

Soft Tissue: No soft tissue mass or fluid collection.
IMPRESSION: 1. Distal posterior tibial tendinosis with small partial tear and
mild tenosynovitis.
2. Mild third TMT joint osteoarthritis.

## 2021-03-18 ENCOUNTER — Telehealth: Payer: Self-pay | Admitting: Orthopedic Surgery

## 2021-03-18 NOTE — Progress Notes (Signed)
Hi [name] can you keep her out until 15 April.  I wrote her out on a prescription pad for until the sixth.  If you can keep her out until 15 April and send that to her via MyChart she can send it her employer thing.  Also can you set her up an appointment to come back to see me on 12 April which is right before she is going to go back to work part-time in her shoes starting on the 15th thanks

## 2021-03-18 NOTE — Telephone Encounter (Signed)
Patient called to set an MRI review appt. Patient states Dr. [name] told her she need to be seen right after MRI. Please call patient about this matter at [phone].

## 2021-03-18 NOTE — Telephone Encounter (Signed)
See below. Do you want to call her with results?

## 2021-03-18 NOTE — Telephone Encounter (Signed)
I called.

## 2021-03-19 ENCOUNTER — Other Ambulatory Visit: Payer: Self-pay

## 2021-03-19 NOTE — Progress Notes (Signed)
Note made for patient per Dr Forbes Cellar request.

## 2021-03-26 ENCOUNTER — Other Ambulatory Visit: Payer: Self-pay

## 2021-03-29 ENCOUNTER — Other Ambulatory Visit: Payer: Self-pay

## 2021-03-29 MED FILL — Estradiol Vaginal Tab 10 MCG: VAGINAL | 28 days supply | Qty: 16 | Fill #0 | Status: AC

## 2021-04-01 ENCOUNTER — Ambulatory Visit (INDEPENDENT_AMBULATORY_CARE_PROVIDER_SITE_OTHER): Payer: 59

## 2021-04-01 ENCOUNTER — Ambulatory Visit: Payer: 59 | Admitting: Orthopedic Surgery

## 2021-04-01 DIAGNOSIS — M25511 Pain in right shoulder: Secondary | ICD-10-CM | POA: Diagnosis not present

## 2021-04-01 DIAGNOSIS — G8929 Other chronic pain: Secondary | ICD-10-CM

## 2021-04-01 MED ORDER — METHYLPREDNISOLONE 4 MG PO TBPK
ORAL_TABLET | ORAL | 0 refills | Status: DC
  Filled 2021-04-01: qty 21, 6d supply, fill #0

## 2021-04-02 ENCOUNTER — Other Ambulatory Visit: Payer: Self-pay

## 2021-04-03 ENCOUNTER — Telehealth: Payer: Self-pay | Admitting: Orthopedic Surgery

## 2021-04-03 NOTE — Telephone Encounter (Signed)
Pt needs a letter stating how long she needs to use the knee rover and what restrictions there are. She will be returning the 25 th of this month so she will need it before then! Please call her if you have any questions. She also states you can feel free to e-mail it to her. CB [phone]

## 2021-04-03 NOTE — Telephone Encounter (Signed)
Please advise. Thanks.  

## 2021-04-04 ENCOUNTER — Encounter: Payer: Self-pay | Admitting: Orthopedic Surgery

## 2021-04-04 NOTE — Telephone Encounter (Signed)
Pt said the letter can be sent via mychart

## 2021-04-04 NOTE — Progress Notes (Signed)
Office Visit Note   Patient: [name]           Date of Birth: 1961/07/19           MRN: [id number] Visit Date: 04/01/2021 Requested by: Venita Lick, NP [place],  [place] [address] PCP: Venita Lick, NP  Subjective: Chief Complaint  Patient presents with  . Right Ankle - Follow-up    HPI: Patient presents for follow-up right ankle MRI scan.  She was having primarily medial sided pain.  She has been in a fracture boot as well as on a scooter.  Pain has been relatively severe.  She has been able to walk some.  She has been doing that for several days prior to this clinic visit.  She has an ankle wrap also which she has been using.  She is out of work until sometime in April.  When she does go to work it is about 10,000 steps per day.  She has been taking ibuprofen and using a topical anti-inflammatory.  Patient also describes right shoulder pain from a fall that she sustained several weeks ago.  That has been improving.  Patient also is concerned about her neck.  She has a history of cervical spine fusion done in [place] years ago.  Is having some trapezial pain but no discrete radicular symptoms below the elbow.              ROS: All systems reviewed are negative as they relate to the chief complaint within the history of present illness.  Patient denies  fevers or chills.   Assessment & Plan: Visit Diagnoses:  1. Chronic right shoulder pain     Plan: Impression is multiple orthopedic complaints today with right ankle MRI scan showing some mild posterior tib tendinosis.  That scan is reviewed with the patient today including axial and coronal and sagittal images.  No stress fracture or stress reaction along the medial malleolus.  No definite operative indication present for the findings described and revealed on the MRI scan of the ankle.  Patient has just a very small partial tear and a small amount of fluid in the tendon sheath.  This should improve with  nonoperative measures which we have initiated.  The right shoulder is also examined today.  Rotator cuff strength is intact and no definite labral pathology is present on exam.  No evidence of frozen shoulder.  I think this could be traumatic bursitis which we can follow along.  Discussed injection but we will hold off on that for now.  Regarding the neck she does have a history of fusion but no real radicular symptoms today.  Having some trapezial pain which does not really clearly align with adjacent segment disease in the cervical spine.  I think Medrol Dosepak for the neck and shoulder is a reasonable thing to start with hold off on the ibuprofen during that medication course.  We will get her back to work later this month.  If she has further issues with her right ankle I would likely send her to our foot and ankle specialist for a second opinion.  Follow-Up Instructions: Return if symptoms worsen or fail to improve.   Orders:  Orders Placed This Encounter  Procedures  . XR Shoulder Right   Meds ordered this encounter  Medications  . methylPREDNISolone (MEDROL DOSEPAK) 4 MG TBPK tablet    Sig: Taper as directed 6 tabs on day 1, 5 tabs on day 2,  4 tabs on day 3, 3 tabs on day 4, 2 tabs on day 5 , 1 tab on 6, then stop.    Dispense:  21 tablet    Refill:  0      Procedures: No procedures performed   Clinical Data: No additional findings.  Objective: Vital Signs: There were no vitals taken for this visit.  Physical Exam:   Constitutional: Patient appears well-developed HEENT:  Head: Normocephalic Eyes:EOM are normal Neck: Normal range of motion Cardiovascular: Normal rate Pulmonary/chest: Effort normal Neurologic: Patient is alert Skin: Skin is warm Psychiatric: Patient has normal mood and affect    Ortho Exam: Ortho exam demonstrates mild tenderness behind the malleolus of the posterior tib tendon.  Patient has good inversion eversion ankle dorsiflexion  plantarflexion strength with no real pain with resisted great toe flexion.  She is able to stand on her toes with appropriate heel inversion.  No pain with pronation supination of the right forefoot.  No calf tenderness with negative Homans today.  Right shoulder examination demonstrates full active and passive range of motion with no restriction external rotation of 15 degrees of abduction.  O'Brien's testing is negative on the right left.  No discrete AC joint tenderness is present.  Negative apprehension relocation testing.  Rotator cuff strength is excellent infraspinatusand subscap muscle testing with no coarse grinding or crepitus with active or passive range of motion of the shoulder.  Cervical spine exam demonstrates reasonable range of motion with no paresthesias C5-T1 on the right.  No muscle atrophy is present right arm versus left arm.  Does have some muscle spasm type pain in the upper trapezial region.  Specialty Comments:  No specialty comments available.  Imaging: No results found.   PMFS History: Patient Active Problem List   Diagnosis Date Noted  . History of uterine fibroid 01/05/2021  . Herpes 07/12/2020  . GERD (gastroesophageal reflux disease) 05/23/2020  . Vaginal atrophy 05/23/2020  . Cervical spine arthritis 05/23/2020  . Osteopenia 05/23/2020  . Insomnia 05/20/2020  . Postmenopausal hormone therapy 05/20/2020   History reviewed. No pertinent past medical history.  Family History  Problem Relation Age of Onset  . Lung cancer Mother   . Thyroid disease Mother   . Hypertension Mother   . Hyperlipidemia Mother   . CAD Mother        stent x 1  . Osteoporosis Mother   . Leukemia Father 55  . Rheum arthritis Sister   . Melanoma Sister   . Osteopenia Sister   . Autoimmune disease Daughter   . Colon cancer Maternal Uncle   . Heart disease Maternal Grandmother   . Colon cancer Maternal Grandfather   . Heart disease Maternal Grandfather   . Stroke Maternal  Grandfather   . Heart disease Paternal Grandfather     Past Surgical History:  Procedure Laterality Date  . AUGMENTATION MAMMAPLASTY Bilateral 2010  . CERVICAL SPINE SURGERY  2003   C5-C7- double dissectomy  . PAROTID GLAND TUMOR EXCISION     Social History   Occupational History  . Not on file  Tobacco Use  . Smoking status: Never Smoker  . Smokeless tobacco: Never Used  Vaping Use  . Vaping Use: Never used  Substance and Sexual Activity  . Alcohol use: Yes    Comment: ocassional  . Drug use: Never  . Sexual activity: Yes

## 2021-04-05 ENCOUNTER — Encounter: Payer: Self-pay | Admitting: Orthopedic Surgery

## 2021-04-05 NOTE — Telephone Encounter (Signed)
Okay to use the knee scooter for the first week back.  No real restrictions other than I would recommend that she only work 8-hour shifts as opposed to 10-hour shifts.  I think it would also be advisable for her to get a handicap placard for 2 months to diminish the amount of steps she has to walk to and from her place of employment if that is possible

## 2021-04-08 NOTE — Telephone Encounter (Signed)
Note done. Mychart message sent to patient to advise

## 2021-04-22 ENCOUNTER — Other Ambulatory Visit: Payer: Self-pay

## 2021-04-22 ENCOUNTER — Other Ambulatory Visit: Payer: Self-pay | Admitting: Obstetrics and Gynecology

## 2021-04-22 MED FILL — Trazodone HCl Tab 50 MG: ORAL | 90 days supply | Qty: 90 | Fill #0 | Status: AC

## 2021-04-23 ENCOUNTER — Other Ambulatory Visit: Payer: Self-pay

## 2021-04-24 ENCOUNTER — Other Ambulatory Visit: Payer: Self-pay

## 2021-04-24 MED ORDER — PROGESTERONE 200 MG PO CAPS
ORAL_CAPSULE | ORAL | 3 refills | Status: DC
  Filled 2021-04-24: qty 90, 90d supply, fill #0

## 2021-05-02 NOTE — Telephone Encounter (Signed)
sure

## 2021-05-15 DIAGNOSIS — Z79890 Hormone replacement therapy (postmenopausal): Secondary | ICD-10-CM | POA: Diagnosis not present

## 2021-05-15 DIAGNOSIS — F520 Hypoactive sexual desire disorder: Secondary | ICD-10-CM | POA: Diagnosis not present

## 2021-06-03 ENCOUNTER — Other Ambulatory Visit: Payer: Self-pay

## 2021-06-03 MED ORDER — ESTRADIOL 10 MCG VA TABS
ORAL_TABLET | VAGINAL | 2 refills | Status: DC
  Filled 2021-06-03: qty 18, 63d supply, fill #0

## 2021-06-17 ENCOUNTER — Encounter: Payer: Self-pay | Admitting: Nurse Practitioner

## 2021-06-17 ENCOUNTER — Ambulatory Visit: Payer: 59 | Admitting: Orthopedic Surgery

## 2021-06-19 ENCOUNTER — Ambulatory Visit: Payer: 59 | Admitting: Orthopedic Surgery

## 2021-06-19 ENCOUNTER — Other Ambulatory Visit: Payer: Self-pay

## 2021-06-19 DIAGNOSIS — G8929 Other chronic pain: Secondary | ICD-10-CM

## 2021-06-19 DIAGNOSIS — M25511 Pain in right shoulder: Secondary | ICD-10-CM | POA: Diagnosis not present

## 2021-06-23 ENCOUNTER — Encounter: Payer: Self-pay | Admitting: Orthopedic Surgery

## 2021-06-23 NOTE — Progress Notes (Signed)
Office Visit Note   Patient: [name]           Date of Birth: 19-May-1961           MRN: [id number] Visit Date: 06/19/2021 Requested by: Venita Lick, NP [address] [place],  [place] [address] PCP: Venita Lick, NP  Subjective: Chief Complaint  Patient presents with   Right Shoulder - Pain    HPI: [name] is a 60 y.o. female who presents to the office complaining of continued right shoulder pain.  She complains of right shoulder pain since she fell on her arm off of her knee scooter.  She tried steroid Dosepak from her last visit without any lasting relief.  Localizes most of her pain to the anterior aspect of the right shoulder with radiation into her bicep.  She has no radiation past the elbow.  She notes she initially had neck pain after the fall but she has had none since around the time of the injury.  She does have occasional trapezius pain on the right-hand side.  She describes her pain as a sharp pain and a deep burning sensation at times.  She has radiation of pain medially near the clavicle.  She also notes a popping sensation in the anterior shoulder that she notices daily.  Denies any radicular pain down the arm.  No real scapular pain.  She wakes through the night with pain every night.  She has difficulty laying flat on her back.  Most of her pain is in the morning after getting up from bed.  She has history of prior cervical spine fusion but no history of right shoulder surgery.  She had an injection for bursitis about 20 years ago that provided excellent lasting relief.  No history of diabetes, smoking, hypertension..                ROS: All systems reviewed are negative as they relate to the chief complaint within the history of present illness.  Patient denies fevers or chills.  Assessment & Plan: Visit Diagnoses:  1. Chronic right shoulder pain     Plan: Patient is a 60 year old female who presents complaint of right shoulder pain.  She has had continued  right shoulder pain since earlier this year since she fell off her knee scooter.  She has taken steroid Dosepak with no lasting relief.  She complains of continued anterior pain with popping sensation anteriorly that she notes daily.  On exam she does have very subtle decreased strength of the subscapularis on the right-hand side compared with the left as well as a positive belly press test that is  asymmetric compared with the left.  Additionally, she has some increased external rotation passively of the right shoulder compared to the left.  All these findings are concerning for partial subscapularis tear with subluxing bicep tendon.  This would explain her anterior pain as well as the popping sensation that she reports daily.  Plan to order MRI arthrogram of the right shoulder for further evaluation of potential subscapularis tear versus SLAP tear.  Follow-up after MRI to review results.  Follow-Up Instructions: No follow-ups on file.   Orders:  Orders Placed This Encounter  Procedures   MR SHOULDER RIGHT W CONTRAST   Arthrogram   No orders of the defined types were placed in this encounter.     Procedures: No procedures performed   Clinical Data: No additional findings.  Objective: Vital Signs: There were no vitals  taken for this visit.  Physical Exam:  Constitutional: Patient appears well-developed HEENT:  Head: Normocephalic Eyes:EOM are normal Neck: Normal range of motion Cardiovascular: Normal rate Pulmonary/chest: Effort normal Neurologic: Patient is alert Skin: Skin is warm Psychiatric: Patient has normal mood and affect  Ortho Exam: Ortho exam demonstrates right shoulder with 70 degrees external rotation, 90 degrees abduction, 170 degrees forward flexion.  This compared with the left shoulder 30 degrees external rotation, 100 degrees abduction, 170 degrees forward flexion.  Moderate tenderness to palpation over the bicipital groove on the right-hand side with no  tenderness over the left-hand side.  She has excellent rotator cuff strength of supraspinatus and infraspinatus with 5/5 motor strength of subscapularis on the left-hand side and 5 -/5 strength of subscapularis on the right-hand side.  She does have a positive belly press test on the right-hand side with negative test on the left.  Negative Hornblower sign.  Negative drop arm test.  Positive O'Brien sign.  No asymmetric tenderness over the acromioclavicular joint.  Negative Spurling sign.  5/5 motor strength of bilateral grip strength, finger abduction, pronation/supination, bicep, tricep, deltoid.  Specialty Comments:  No specialty comments available.  Imaging: No results found.   PMFS History: Patient Active Problem List   Diagnosis Date Noted   History of uterine fibroid 01/05/2021   Herpes 07/12/2020   GERD (gastroesophageal reflux disease) 05/23/2020   Vaginal atrophy 05/23/2020   Cervical spine arthritis 05/23/2020   Osteopenia 05/23/2020   Insomnia 05/20/2020   Postmenopausal hormone therapy 05/20/2020   No past medical history on file.  Family History  Problem Relation Age of Onset   Lung cancer Mother    Thyroid disease Mother    Hypertension Mother    Hyperlipidemia Mother    CAD Mother        stent x 1   Osteoporosis Mother    Leukemia Father 67   Rheum arthritis Sister    Melanoma Sister    Osteopenia Sister    Autoimmune disease Daughter    Colon cancer Maternal Uncle    Heart disease Maternal Grandmother    Colon cancer Maternal Grandfather    Heart disease Maternal Grandfather    Stroke Maternal Grandfather    Heart disease Paternal Grandfather     Past Surgical History:  Procedure Laterality Date   AUGMENTATION MAMMAPLASTY Bilateral 2010   CERVICAL SPINE SURGERY  2003   C5-C7- double dissectomy   PAROTID GLAND TUMOR EXCISION     Social History   Occupational History   Not on file  Tobacco Use   Smoking status: Never   Smokeless tobacco: Never   Vaping Use   Vaping Use: Never used  Substance and Sexual Activity   Alcohol use: Yes    Comment: ocassional   Drug use: Never   Sexual activity: Yes

## 2021-06-26 ENCOUNTER — Encounter: Payer: Self-pay | Admitting: Orthopedic Surgery

## 2021-07-03 ENCOUNTER — Encounter: Payer: Self-pay | Admitting: Orthopedic Surgery

## 2021-07-03 NOTE — Telephone Encounter (Signed)
Okay for this

## 2021-07-03 NOTE — Telephone Encounter (Signed)
Ok for this? 

## 2021-07-11 ENCOUNTER — Other Ambulatory Visit: Payer: Self-pay

## 2021-07-11 ENCOUNTER — Telehealth: Payer: 59 | Admitting: Physician Assistant

## 2021-07-11 DIAGNOSIS — B9689 Other specified bacterial agents as the cause of diseases classified elsewhere: Secondary | ICD-10-CM | POA: Diagnosis not present

## 2021-07-11 DIAGNOSIS — J019 Acute sinusitis, unspecified: Secondary | ICD-10-CM

## 2021-07-11 MED ORDER — AMOXICILLIN-POT CLAVULANATE 875-125 MG PO TABS
1.0000 | ORAL_TABLET | Freq: Two times a day (BID) | ORAL | 0 refills | Status: DC
  Filled 2021-07-11: qty 14, 7d supply, fill #0

## 2021-07-11 MED FILL — Trazodone HCl Tab 50 MG: ORAL | 90 days supply | Qty: 90 | Fill #1 | Status: AC

## 2021-07-11 NOTE — Patient Instructions (Signed)
[name], thank you for joining Leeanne Rio, PA-C for today's virtual visit.  While this provider is not your primary care provider (PCP), if your PCP is located in our provider database this encounter information will be shared with them immediately following your visit.  Consent: (Patient) [name] provided verbal consent for this virtual visit at the beginning of the encounter.  Current Medications:  Current Outpatient Medications:    Biotin 5 MG CAPS, Take 1 capsule by mouth in the morning and at bedtime., Disp: , Rfl:    Cholecalciferol 50 MCG (2000 UT) CAPS, Take 1 capsule by mouth daily., Disp: , Rfl:    esomeprazole (NEXIUM) 20 MG capsule, Take 1 capsule by mouth daily., Disp: , Rfl:    Estradiol 10 MCG TABS vaginal tablet, Place 1 tablet (10 mcg total) vaginally twice a week, Disp: 16 tablet, Rfl: 2   ibuprofen (ADVIL) 600 MG tablet, TAKE 1 TABLET BY MOUTH EVERY 6 (SIX) HOURS AS NEEDED., Disp: 30 tablet, Rfl: 0   melatonin 3 MG TABS tablet, Take 3 mg by mouth at bedtime., Disp: , Rfl:    metaxalone (SKELAXIN) 800 MG tablet, Take 1 tablet by mouth at bedtime. prn, Disp: , Rfl:    methylPREDNISolone (MEDROL DOSEPAK) 4 MG TBPK tablet, Taper as directed 6 tabs on day 1, 5 tabs on day 2, 4 tabs on day 3, 3 tabs on day 4, 2 tabs on day 5 , 1 tab on 6, then stop., Disp: 21 tablet, Rfl: 0   nitrofurantoin, macrocrystal-monohydrate, (MACROBID) 100 MG capsule, Take 1 tablet (100 MG) by mouth only after intercourse., Disp: 90 capsule, Rfl: 4   ondansetron (ZOFRAN) 4 MG tablet, TAKE 1 TABLET BY MOUTH EVERY 8 (EIGHT) HOURS AS NEEDED FOR NAUSEA OR VOMITING., Disp: 12 tablet, Rfl: 0   Probiotic Product (ALIGN PO), Take 1 tablet by mouth daily., Disp: , Rfl:    progesterone (PROMETRIUM) 200 MG capsule, Take 1 capsule by mouth at bedtime., Disp: , Rfl:    progesterone (PROMETRIUM) 200 MG capsule, TAKE 1 CAPSULE BY MOUTH NIGHTLY, Disp: 90 capsule, Rfl: 3   progesterone (PROMETRIUM)  200 MG capsule, Take 1 capsule (200 mg total) by mouth nightly, Disp: 90 capsule, Rfl: 3   testosterone cypionate (DEPOTESTOSTERONE CYPIONATE) 200 MG/ML injection, Inject 200 mg into the muscle every 28 (twenty-eight) days., Disp: , Rfl:    traZODone (DESYREL) 50 MG tablet, TAKE 1 TABLET BY MOUTH AT BEDTIME., Disp: 90 tablet, Rfl: 4   valACYclovir (VALTREX) 1000 MG tablet, Take one tablet by mouth once daily for total of 5 days as needed for outbreaks., Disp: 90 tablet, Rfl: 1   YUVAFEM 10 MCG TABS vaginal tablet, Place 1 tablet vaginally 2 (two) times a week., Disp: , Rfl:    Medications ordered in this encounter:  No orders of the defined types were placed in this encounter.    *If you need refills on other medications prior to your next appointment, please contact your pharmacy*  Follow-Up: Call back or seek an in-person evaluation if the symptoms worsen or if the condition fails to improve as anticipated.  Other Instructions Please take antibiotic as directed.  Increase fluid intake.  Use Saline nasal spray.  Take a daily multivitamin. Consider starting an OTC nasal steroid like Flonase or Nasacort.  Place a humidifier in the bedroom.  Please call or return clinic if symptoms are not improving.  Sinusitis Sinusitis is redness, soreness, and swelling (inflammation) of the paranasal sinuses. Paranasal sinuses  are air pockets within the bones of your face (beneath the eyes, the middle of the forehead, or above the eyes). In healthy paranasal sinuses, mucus is able to drain out, and air is able to circulate through them by way of your nose. However, when your paranasal sinuses are inflamed, mucus and air can become trapped. This can allow bacteria and other germs to grow and cause infection. Sinusitis can develop quickly and last only a short time (acute) or continue over a long period (chronic). Sinusitis that lasts for more than 12 weeks is considered chronic.  CAUSES  Causes of sinusitis  include: Allergies. Structural abnormalities, such as displacement of the cartilage that separates your nostrils (deviated septum), which can decrease the air flow through your nose and sinuses and affect sinus drainage. Functional abnormalities, such as when the small hairs (cilia) that line your sinuses and help remove mucus do not work properly or are not present. SYMPTOMS  Symptoms of acute and chronic sinusitis are the same. The primary symptoms are pain and pressure around the affected sinuses. Other symptoms include: Upper toothache. Earache. Headache. Bad breath. Decreased sense of smell and taste. A cough, which worsens when you are lying flat. Fatigue. Fever. Thick drainage from your nose, which often is green and may contain pus (purulent). Swelling and warmth over the affected sinuses. DIAGNOSIS  Your caregiver will perform a physical exam. During the exam, your caregiver may: Look in your nose for signs of abnormal growths in your nostrils (nasal polyps). Tap over the affected sinus to check for signs of infection. View the inside of your sinuses (endoscopy) with a special imaging device with a light attached (endoscope), which is inserted into your sinuses. If your caregiver suspects that you have chronic sinusitis, one or more of the following tests may be recommended: Allergy tests. Nasal culture A sample of mucus is taken from your nose and sent to a lab and screened for bacteria. Nasal cytology A sample of mucus is taken from your nose and examined by your caregiver to determine if your sinusitis is related to an allergy. TREATMENT  Most cases of acute sinusitis are related to a viral infection and will resolve on their own within 10 days. Sometimes medicines are prescribed to help relieve symptoms (pain medicine, decongestants, nasal steroid sprays, or saline sprays).  However, for sinusitis related to a bacterial infection, your caregiver will prescribe antibiotic  medicines. These are medicines that will help kill the bacteria causing the infection.  Rarely, sinusitis is caused by a fungal infection. In theses cases, your caregiver will prescribe antifungal medicine. For some cases of chronic sinusitis, surgery is needed. Generally, these are cases in which sinusitis recurs more than 3 times per year, despite other treatments. HOME CARE INSTRUCTIONS  Drink plenty of water. Water helps thin the mucus so your sinuses can drain more easily. Use a humidifier. Inhale steam 3 to 4 times a day (for example, sit in the bathroom with the shower running). Apply a warm, moist washcloth to your face 3 to 4 times a day, or as directed by your caregiver. Use saline nasal sprays to help moisten and clean your sinuses. Take over-the-counter or prescription medicines for pain, discomfort, or fever only as directed by your caregiver. SEEK IMMEDIATE MEDICAL CARE IF: You have increasing pain or severe headaches. You have nausea, vomiting, or drowsiness. You have swelling around your face. You have vision problems. You have a stiff neck. You have difficulty breathing. MAKE SURE YOU:  Understand these instructions. Will watch your condition. Will get help right away if you are not doing well or get worse. Document Released: 12/08/2005 Document Revised: 03/01/2012 Document Reviewed: 12/23/2011 [facility] Patient Information 2014 [place], [place].    If you have been instructed to have an in-person evaluation today at a local Urgent Care facility, please use the link below. It will take you to a list of all of our available [facility], including address, phone number and hours of operation. Please do not delay care.  [facility]  If you or a family member do not have a primary care provider, use the link below to schedule a visit and establish care. When you choose a [facility] primary care physician or advanced practice provider, you gain a  long-term partner in health. Find a Primary Care Provider  Learn more about [facility] in-office and virtual care options: [facility]

## 2021-07-11 NOTE — Progress Notes (Signed)
Virtual Visit Consent   [name], you are scheduled for a virtual visit with a [facility] provider today.     Just as with appointments in the office, your consent must be obtained to participate.  Your consent will be active for this visit and any virtual visit you may have with one of our providers in the next 365 days.     If you have a MyChart account, a copy of this consent can be sent to you electronically.  All virtual visits are billed to your insurance company just like a traditional visit in the office.    As this is a virtual visit, video technology does not allow for your provider to perform a traditional examination.  This may limit your provider's ability to fully assess your condition.  If your provider identifies any concerns that need to be evaluated in person or the need to arrange testing (such as labs, EKG, etc.), we will make arrangements to do so.     Although advances in technology are sophisticated, we cannot ensure that it will always work on either your end or our end.  If the connection with a video visit is poor, the visit may have to be switched to a telephone visit.  With either a video or telephone visit, we are not always able to ensure that we have a secure connection.     I need to obtain your verbal consent now.   Are you willing to proceed with your visit today?    [name] has provided verbal consent on 07/11/2021 for a virtual visit (video or telephone).   Leeanne Rio, [place]   Date: 07/11/2021 9:13 AM   Virtual Visit via Video Note   I, Leeanne Rio, connected with  [name]  ([id number], 05-22-1961) on 07/11/21 at  9:15 AM EDT by a video-enabled telemedicine application and verified that I am speaking with the correct person using two identifiers.  Location: Patient: Virtual Visit Location Patient: Home Provider: Virtual Visit Location Provider: Home Office   I discussed the limitations of evaluation and management by  telemedicine and the availability of in person appointments. The patient expressed understanding and agreed to proceed.    History of Present Illness: [name] is a 60 y.o. who identifies as a female who was assigned female at birth, and is being seen today for possible sinusitis.. Patient endorses > 1 week of nasal congestion, sinus pressure, significant drainage, now with ear pain and R maxillary sinus pain. Also with thick drainage that is now colored. Took home COVID tests which were negative. Feels like symptoms are continuing to worsen. Has been taking Sudafed OTC to help with symptoms. Also alternating tylenol and motrin.    HPI: HPI  Problems:  Patient Active Problem List   Diagnosis Date Noted   History of uterine fibroid 01/05/2021   Herpes 07/12/2020   GERD (gastroesophageal reflux disease) 05/23/2020   Vaginal atrophy 05/23/2020   Cervical spine arthritis 05/23/2020   Osteopenia 05/23/2020   Insomnia 05/20/2020   Postmenopausal hormone therapy 05/20/2020    Allergies: No Known Allergies Medications:  Current Outpatient Medications:    amoxicillin-clavulanate (AUGMENTIN) 875-125 MG tablet, Take 1 tablet by mouth 2 (two) times daily., Disp: 14 tablet, Rfl: 0   Biotin 5 MG CAPS, Take 1 capsule by mouth in the morning and at bedtime., Disp: , Rfl:    Cholecalciferol 50 MCG (2000 UT) CAPS, Take 1 capsule by mouth daily., Disp: , Rfl:  esomeprazole (NEXIUM) 20 MG capsule, Take 1 capsule by mouth daily., Disp: , Rfl:    Estradiol 10 MCG TABS vaginal tablet, Place 1 tablet (10 mcg total) vaginally twice a week, Disp: 16 tablet, Rfl: 2   ibuprofen (ADVIL) 600 MG tablet, TAKE 1 TABLET BY MOUTH EVERY 6 (SIX) HOURS AS NEEDED., Disp: 30 tablet, Rfl: 0   melatonin 3 MG TABS tablet, Take 3 mg by mouth at bedtime., Disp: , Rfl:    metaxalone (SKELAXIN) 800 MG tablet, Take 1 tablet by mouth at bedtime. prn, Disp: , Rfl:    nitrofurantoin, macrocrystal-monohydrate, (MACROBID) 100 MG  capsule, Take 1 tablet (100 MG) by mouth only after intercourse., Disp: 90 capsule, Rfl: 4   Probiotic Product (ALIGN PO), Take 1 tablet by mouth daily., Disp: , Rfl:    progesterone (PROMETRIUM) 200 MG capsule, TAKE 1 CAPSULE BY MOUTH NIGHTLY, Disp: 90 capsule, Rfl: 3   testosterone cypionate (DEPOTESTOSTERONE CYPIONATE) 200 MG/ML injection, Inject 200 mg into the muscle every 28 (twenty-eight) days., Disp: , Rfl:    traZODone (DESYREL) 50 MG tablet, TAKE 1 TABLET BY MOUTH AT BEDTIME., Disp: 90 tablet, Rfl: 4   valACYclovir (VALTREX) 1000 MG tablet, Take one tablet by mouth once daily for total of 5 days as needed for outbreaks., Disp: 90 tablet, Rfl: 1   YUVAFEM 10 MCG TABS vaginal tablet, Place 1 tablet vaginally 2 (two) times a week., Disp: , Rfl:   Observations/Objective: Patient is well-developed, well-nourished in no acute distress.  Resting comfortably at home.  Head is normocephalic, atraumatic.  No labored breathing. Speech is clear and coherent with logical content.  Patient is alert and oriented at baseline.   Assessment and Plan: 1. Acute bacterial sinusitis - amoxicillin-clavulanate (AUGMENTIN) 875-125 MG tablet; Take 1 tablet by mouth 2 (two) times daily.  Dispense: 14 tablet; Refill: 0 Rx Augmentin.  Increase fluids.  Rest.  Saline nasal spray.  Probiotic.  Mucinex as directed.  Humidifier in bedroom. Start OTC Flonase.   Follow Up Instructions: I discussed the assessment and treatment plan with the patient. The patient was provided an opportunity to ask questions and all were answered. The patient agreed with the plan and demonstrated an understanding of the instructions.  A copy of instructions were sent to the patient via MyChart.  The patient was advised to call back or seek an in-person evaluation if the symptoms worsen or if the condition fails to improve as anticipated.  Time:  I spent 10 minutes with the patient via telehealth technology discussing the above  problems/concerns.    Leeanne Rio, PA-C

## 2021-07-15 ENCOUNTER — Other Ambulatory Visit: Payer: Self-pay

## 2021-07-15 ENCOUNTER — Ambulatory Visit
Admission: RE | Admit: 2021-07-15 | Discharge: 2021-07-15 | Disposition: A | Discharge status: Home / Self Care | Payer: 59 | Source: Ambulatory Visit | Attending: Surgical | Admitting: Surgical

## 2021-07-15 DIAGNOSIS — M25511 Pain in right shoulder: Secondary | ICD-10-CM

## 2021-07-15 DIAGNOSIS — S46011A Strain of muscle(s) and tendon(s) of the rotator cuff of right shoulder, initial encounter: Secondary | ICD-10-CM | POA: Diagnosis not present

## 2021-07-15 DIAGNOSIS — G8929 Other chronic pain: Secondary | ICD-10-CM | POA: Insufficient documentation

## 2021-07-15 DIAGNOSIS — M75111 Incomplete rotator cuff tear or rupture of right shoulder, not specified as traumatic: Secondary | ICD-10-CM | POA: Insufficient documentation

## 2021-07-15 IMAGING — RF DG FLUORO GUIDE NDL PLC/BX
1 series · 1 of 1 positions shown · non-contrast
Comparison: None.

CLINICAL DATA: Right shoulder pain.

EXAM:
RIGHT SHOULDER ARTHROGRAM UNDER FLUOROSCOPY

[Series 1: cp_bariatric · 0.18mm/px · 1 of 1 slices shown]
[im 1/1]
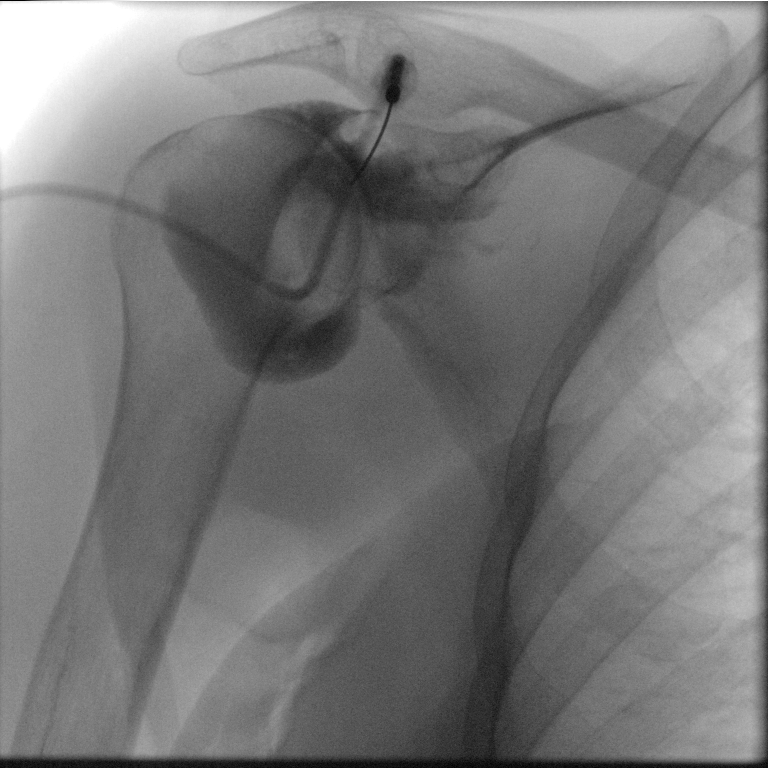

[1 of 1 positions shown; findings below may reference images not displayed]

FLUOROSCOPY TIME:  Fluoroscopy Time:  0.4 minute

Radiation Exposure Index (if provided by the fluoroscopic device):
4.4 mGy

Number of Acquired Spot Images: 0

PROCEDURE:
The risks and benefits of the procedure were discussed with the
patient, and written informed consent was obtained. The patient
stated no history of allergy to contrast media. A formal timeout
procedure was performed with the patient according to departmental
protocol.

The patient was placed supine on the fluoroscopy table and the right
glenohumeral joint was identified under fluoroscopy. The skin
overlying the joint was subsequently cleaned with Chloraprep and a
sterile drape was placed over the area of interest. 5 ml 1%
Lidocaine was used to anesthetize the skin around the needle
insertion site.

A 22 gauge spinal needle was inserted into the joint under
fluoroscopy. Position was confirmed with injection of less than 1ml
of Omnipaque 180 under fluoroscopy.

12 ml of gadolinium mixture (0.05 mL of Gadavist mixed with 10 mL
sterile saline and 10 mL Omnipaque 180) was injected into the joint.

The needle was removed and hemostasis was achieved. The patient was
subsequently transferred to MRI for imaging.
IMPRESSION: Technically successful right shoulder arthrogram under fluoroscopy.

## 2021-07-15 IMAGING — MR MR SHOULDER*R* W/CM
6 series · 40 of 40 positions shown · IV contrast (agent unspecified)
Comparison: None.

CLINICAL DATA: Status post fall in [REDACTED]. Right shoulder pain.

EXAM:
MR ARTHROGRAM OF THE RIGHT SHOULDER
TECHNIQUE: Multiplanar, multisequence MR imaging of the right shoulder was
performed following the administration of intra-articular contrast.
CONTRAST:  See Injection Documentation.

[Series 5: T1 fat-sat · axial · right · 4.0mm · 0.55mm/px · z∈[-13,+107]mm · 8 of 25 slices shown (1 of 3)]
[im 1/25]
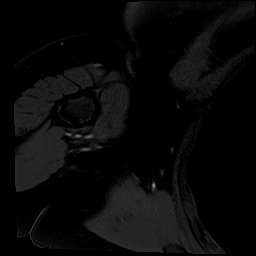
[im 4/25]
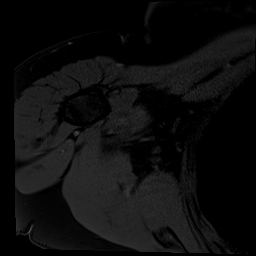
[im 7/25]
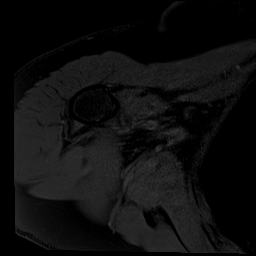
[im 11/25]
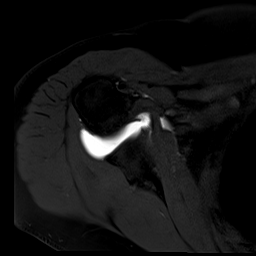
[im 14/25]
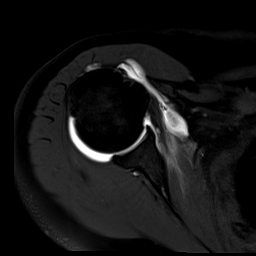
[im 18/25]
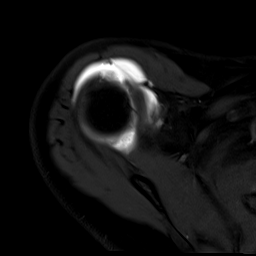
[im 21/25]
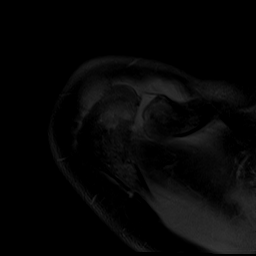
[im 25/25]
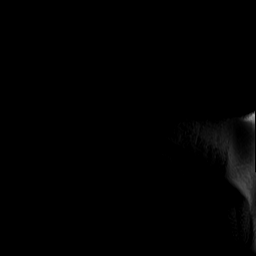

[Series 6: T1 fat-sat · oblique · right · 4.0mm · 0.55mm/px · 6 of 21 slices shown (2 of 3)]
[im 1/21]
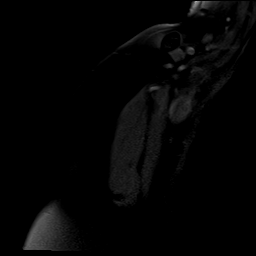
[im 5/21]
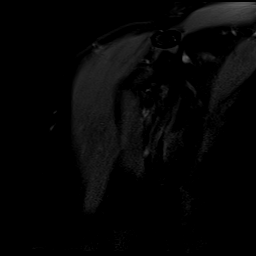
[im 9/21]
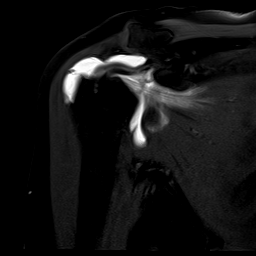
[im 13/21]
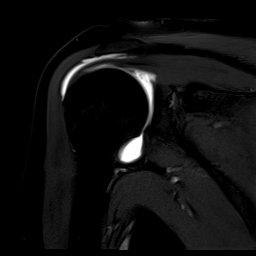
[im 17/21]
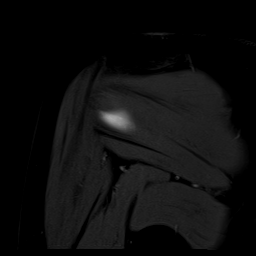
[im 21/21]
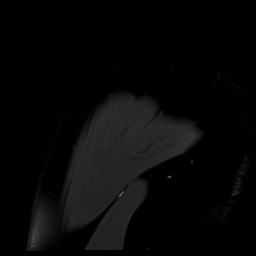

[Series 7: T2 fat-sat · oblique · right · 4.0mm · 0.55mm/px · 6 of 21 slices shown (1 of 2)]
[im 1/21]
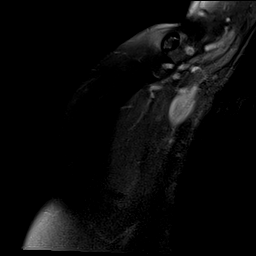
[im 5/21]
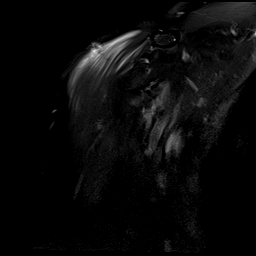
[im 9/21]
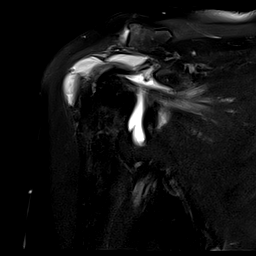
[im 13/21]
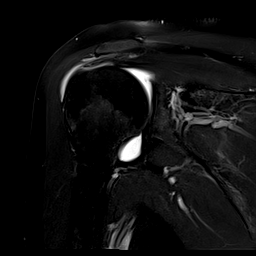
[im 17/21]
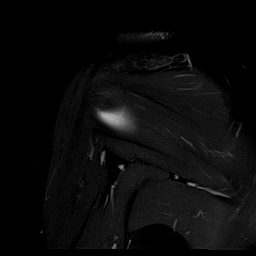
[im 21/21]
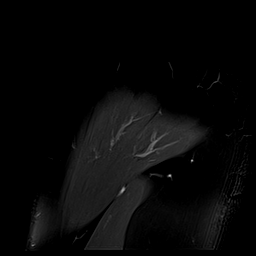

[Series 8: T1 · oblique · right · 4.0mm · 0.51mm/px · 6 of 21 slices shown]
[im 1/21]
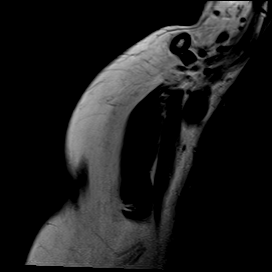
[im 5/21]
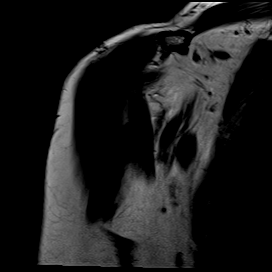
[im 9/21]
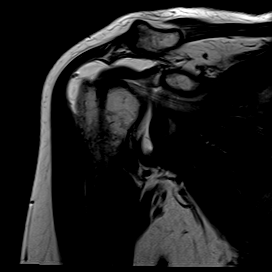
[im 13/21]
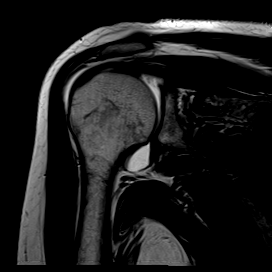
[im 17/21]
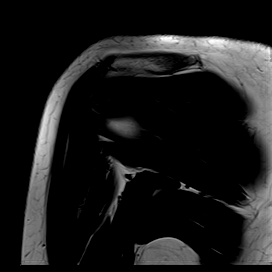
[im 21/21]
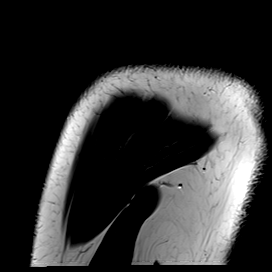

[Series 9: T2 fat-sat · oblique · right · 4.0mm · 0.55mm/px · 7 of 25 slices shown (2 of 2)]
[im 1/25]
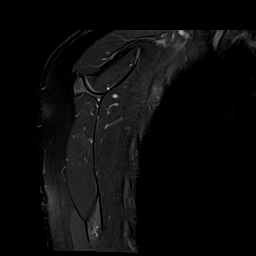
[im 5/25]
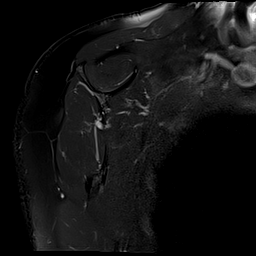
[im 9/25]
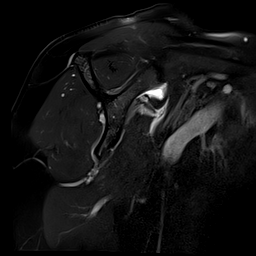
[im 13/25]
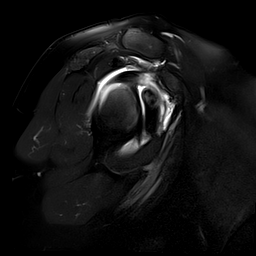
[im 17/25]
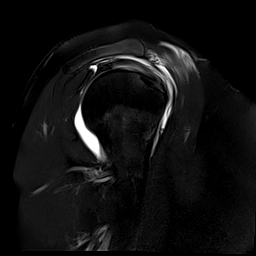
[im 21/25]
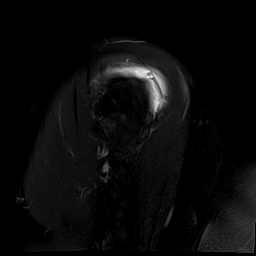
[im 25/25]
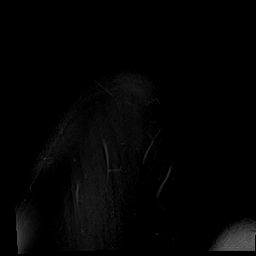

[Series 12: T1 fat-sat · sagittal · right · 4.0mm · 0.62mm/px · 7 of 26 slices shown (3 of 3)]
[im 1/26]
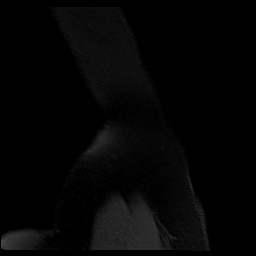
[im 5/26]
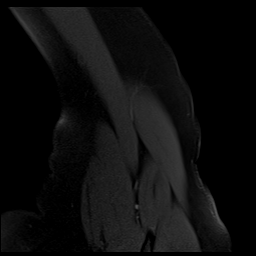
[im 9/26]
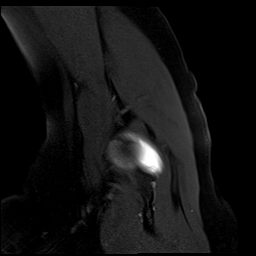
[im 13/26]
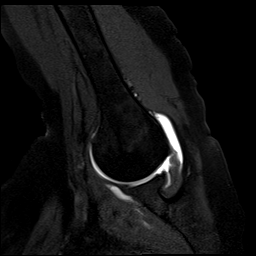
[im 17/26]
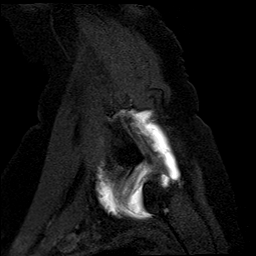
[im 21/26]
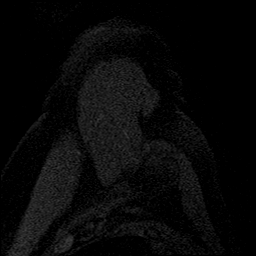
[im 26/26]
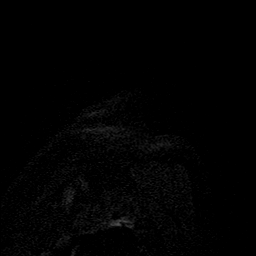

[40 of 40 positions shown; findings below may reference images not displayed]

FINDINGS: Rotator cuff: Large 14 mm full-thickness tear of the anterior aspect
of the supraspinatus tendon with 16 mm of retraction and a
partial-thickness articular surface tear of the posterior fibers.
Mild tendinosis of the infraspinatus tendon. Teres minor tendon is
intact. Subscapularis tendon is intact.

Muscles: No muscle atrophy or edema. No intramuscular fluid
collection or hematoma.

Biceps Long Head: Intraarticular and extraarticular portions of the
biceps tendon are intact.

Acromioclavicular Joint: Moderate arthropathy of the
acromioclavicular joint. Contrast in the subacromial/subdeltoid
bursa extending through the full-thickness supraspinatus tear.

Glenohumeral Joint: Intraarticular contrast distending the joint
capsule. Normal glenohumeral ligaments. No chondral defect.

Labrum: Intact.

Bones: No fracture or dislocation. No aggressive osseous lesion.

Other: No fluid collection or hematoma.
IMPRESSION: 1. Large 14 mm full-thickness tear of the anterior aspect of the
supraspinatus tendon with 16 mm of retraction and a
partial-thickness articular surface tear of the posterior fibers.
2. Mild tendinosis of the infraspinatus tendon.

## 2021-07-15 MED ORDER — IOHEXOL 180 MG/ML  SOLN
10.0000 mL | Freq: Once | INTRAMUSCULAR | Status: AC | PRN
  Administered 2021-07-15: 10 mL

## 2021-07-15 MED ORDER — SODIUM CHLORIDE (PF) 0.9 % IJ SOLN
20.0000 mL | Freq: Once | INTRAMUSCULAR | Status: AC
  Administered 2021-07-15: 7 mL

## 2021-07-15 MED ORDER — GADOBUTROL 1 MMOL/ML IV SOLN
0.0500 mL | Freq: Once | INTRAVENOUS | Status: AC | PRN
  Administered 2021-07-15: 0.05 mL

## 2021-07-15 MED ORDER — LIDOCAINE HCL (PF) 1 % IJ SOLN
5.0000 mL | Freq: Once | INTRAMUSCULAR | Status: AC
  Administered 2021-07-15: 4 mL
  Filled 2021-07-15: qty 5

## 2021-07-23 ENCOUNTER — Other Ambulatory Visit: Payer: Self-pay

## 2021-07-23 ENCOUNTER — Encounter: Payer: 59 | Admitting: Nurse Practitioner

## 2021-07-23 MED ORDER — PROGESTERONE 200 MG PO CAPS
ORAL_CAPSULE | Freq: Every day | ORAL | 3 refills | Status: DC
  Filled 2021-07-23: qty 90, 90d supply, fill #0

## 2021-07-24 ENCOUNTER — Ambulatory Visit: Payer: 59 | Admitting: Orthopedic Surgery

## 2021-07-24 ENCOUNTER — Other Ambulatory Visit: Payer: Self-pay

## 2021-07-24 DIAGNOSIS — M75121 Complete rotator cuff tear or rupture of right shoulder, not specified as traumatic: Secondary | ICD-10-CM | POA: Diagnosis not present

## 2021-07-25 ENCOUNTER — Encounter: Payer: Self-pay | Admitting: Orthopedic Surgery

## 2021-07-25 NOTE — Progress Notes (Signed)
Office Visit Note   Patient: [name]           Date of Birth: 25-Dec-1960           MRN: [id number] Visit Date: 07/24/2021 Requested by: Venita Lick, NP [address] [place],  [place] [address] PCP: Venita Lick, NP  Subjective: Chief Complaint  Patient presents with   Other     Scan review    HPI: Patient presents for evaluation of right shoulder pain.  Since she was last seen she is had an MRI scan which is reviewed.  She fell on her right shoulder in March as part of rehab from her ankle injury.  She states that the shoulder is not any better.  She reports pain but no weakness.  Denies any loss of passive range of motion.  She still is using a scooter at work.  MRI scan is reviewed.  It shows 14 mm full-thickness tear of the anterior aspect of the supraspinatus with about 16 mm of retraction.  Subscap intact.  No edema in the muscles.              ROS: All systems reviewed are negative as they relate to the chief complaint within the history of present illness.  Patient denies  fevers or chills.   Assessment & Plan: Visit Diagnoses:  1. Complete tear of right rotator cuff, unspecified whether traumatic     Plan: Impression is right shoulder rotator cuff tear which is about a centimeter and a half retracted at this time.  Because of the amount of time she was out of work for her ankle this is not a great time for her to get her shoulder fixed.  I think as long as she maintains full passive range of motion of the shoulder through stretching that she could potentially wait.  Would not wait longer than 6 months to get this fixed as to diminish the amount of tension on the repair.  Did provide a note for her today to continue her current accommodation with the knee scooter at work for 6 more months and no lifting more than 25 pounds with that right arm and no overhead lifting for the next 6 months.  Debbie's card is provided she will call when she wants to get scheduled for  surgical intervention.  The risk and benefits of surgery are discussed with her including not limited to infection nerve vessel damage shoulder stiffness incomplete resolution of pain as well as incomplete resolution of weakness as well as potential retear of the rotator cuff.  Time out of work also discussed.  Patient understands and will proceed likely sometime in December or January.  Follow-Up Instructions: No follow-ups on file.   Orders:  No orders of the defined types were placed in this encounter.  No orders of the defined types were placed in this encounter.     Procedures: No procedures performed   Clinical Data: No additional findings.  Objective: Vital Signs: There were no vitals taken for this visit.  Physical Exam:   Constitutional: Patient appears well-developed HEENT:  Head: Normocephalic Eyes:EOM are normal Neck: Normal range of motion Cardiovascular: Normal rate Pulmonary/chest: Effort normal Neurologic: Patient is alert Skin: Skin is warm Psychiatric: Patient has normal mood and affect   Ortho Exam: Ortho exam demonstrates full active and passive range of motion of both shoulders.  Passive range of motion on the right is 50/100/180.  Similar findings on the left.  She  does have a little bit of coarseness with internal ex rotation at 90 degrees of abduction on the right-hand side.  Negative apprehension relocation testing.  Specialty Comments:  No specialty comments available.  Imaging: No results found.   PMFS History: Patient Active Problem List   Diagnosis Date Noted   History of uterine fibroid 01/05/2021   Herpes 07/12/2020   GERD (gastroesophageal reflux disease) 05/23/2020   Vaginal atrophy 05/23/2020   Cervical spine arthritis 05/23/2020   Osteopenia 05/23/2020   Insomnia 05/20/2020   Postmenopausal hormone therapy 05/20/2020   No past medical history on file.  Family History  Problem Relation Age of Onset   Lung cancer Mother     Thyroid disease Mother    Hypertension Mother    Hyperlipidemia Mother    CAD Mother        stent x 1   Osteoporosis Mother    Leukemia Father 53   Rheum arthritis Sister    Melanoma Sister    Osteopenia Sister    Autoimmune disease Daughter    Colon cancer Maternal Uncle    Heart disease Maternal Grandmother    Colon cancer Maternal Grandfather    Heart disease Maternal Grandfather    Stroke Maternal Grandfather    Heart disease Paternal Grandfather     Past Surgical History:  Procedure Laterality Date   AUGMENTATION MAMMAPLASTY Bilateral 2010   CERVICAL SPINE SURGERY  2003   C5-C7- double dissectomy   PAROTID GLAND TUMOR EXCISION     Social History   Occupational History   Not on file  Tobacco Use   Smoking status: Never   Smokeless tobacco: Never  Vaping Use   Vaping Use: Never used  Substance and Sexual Activity   Alcohol use: Yes    Comment: ocassional   Drug use: Never   Sexual activity: Yes

## 2021-07-28 ENCOUNTER — Encounter: Payer: Self-pay | Admitting: Nurse Practitioner

## 2021-07-31 ENCOUNTER — Encounter: Payer: Self-pay | Admitting: Nurse Practitioner

## 2021-07-31 ENCOUNTER — Other Ambulatory Visit: Payer: Self-pay

## 2021-07-31 ENCOUNTER — Ambulatory Visit (INDEPENDENT_AMBULATORY_CARE_PROVIDER_SITE_OTHER): Payer: 59 | Admitting: Nurse Practitioner

## 2021-07-31 VITALS — BP 104/64 | HR 83 | Temp 98.8°F | Ht 62.0 in | Wt 139.8 lb

## 2021-07-31 DIAGNOSIS — F5101 Primary insomnia: Secondary | ICD-10-CM | POA: Diagnosis not present

## 2021-07-31 DIAGNOSIS — Z0000 Encounter for general adult medical examination without abnormal findings: Secondary | ICD-10-CM

## 2021-07-31 DIAGNOSIS — B009 Herpesviral infection, unspecified: Secondary | ICD-10-CM

## 2021-07-31 DIAGNOSIS — E559 Vitamin D deficiency, unspecified: Secondary | ICD-10-CM

## 2021-07-31 DIAGNOSIS — K219 Gastro-esophageal reflux disease without esophagitis: Secondary | ICD-10-CM

## 2021-07-31 DIAGNOSIS — E7800 Pure hypercholesterolemia, unspecified: Secondary | ICD-10-CM

## 2021-07-31 DIAGNOSIS — Z79890 Hormone replacement therapy (postmenopausal): Secondary | ICD-10-CM | POA: Diagnosis not present

## 2021-07-31 DIAGNOSIS — N952 Postmenopausal atrophic vaginitis: Secondary | ICD-10-CM

## 2021-07-31 DIAGNOSIS — M8588 Other specified disorders of bone density and structure, other site: Secondary | ICD-10-CM | POA: Diagnosis not present

## 2021-07-31 MED ORDER — VALACYCLOVIR HCL 1 G PO TABS
ORAL_TABLET | ORAL | 1 refills | Status: DC
  Filled 2021-07-31: qty 90, 90d supply, fill #0

## 2021-07-31 MED ORDER — TRAZODONE HCL 50 MG PO TABS
ORAL_TABLET | Freq: Every day | ORAL | 4 refills | Status: DC
  Filled 2021-07-31 – 2021-12-30 (×2): qty 90, 90d supply, fill #0

## 2021-07-31 NOTE — Assessment & Plan Note (Signed)
History of with occasional flares, currently no flares.  Valtrex refill sent in. 

## 2021-07-31 NOTE — Assessment & Plan Note (Signed)
Ongoing, continue post coital abx prophylaxis as needed to avoid UTI.  Refills as needed. 

## 2021-07-31 NOTE — Assessment & Plan Note (Signed)
Chronic, stable.  Continue current medication regimen, Trazodone and Melatonin, adjust as needed.  Continue focus on good sleep hygiene techniques at home.  Return in 6 months.

## 2021-07-31 NOTE — Assessment & Plan Note (Signed)
Chronic, stable.  Continue daily Nexium and adjust regimen as needed.  Had colonoscopy and EGD in 2020.  Mag level today. 

## 2021-07-31 NOTE — Patient Instructions (Addendum)
Calcium needs osteopenia =  Adults 19-50 years: 1,000 mg.  Adult men 51-70 years: 1,000 mg.  Adult women 51-70 years: 1,200 mg Adults 71 years and older: 1,200 mg  Osteopenia  Osteopenia is a loss of thickness (density) inside the bones. Another name for osteopenia is low bone mass. Mild osteopenia is a normal part of aging. It is not a disease, and it does notcause symptoms. However, if you have osteopenia and continue to lose bone mass, you could develop a condition that causes the bones to become thin and break more easily (osteoporosis). Osteoporosis can cause you to lose some height, have back pain, and have a stooped posture. Although osteopenia is not a disease, making changes to your lifestyle and diet can help to prevent osteopenia from developing intoosteoporosis. What are the causes? Osteopenia is caused by loss of calcium in the bones. Bones are constantly changing. Old bone cells are continually being replaced with new bone cells.This process builds new bone. The mineral calcium is needed to build new bone and maintain bone density. Bone density is usually highest around age 68. After that, most people's bodiescannot replace all the bone they have lost with new bone. What increases the risk? You are more likely to develop this condition if: You are older than age 25. You are a woman who went through menopause early. You have a long illness that keeps you in bed. You do not get enough exercise. You lack certain nutrients (malnutrition). You have an overactive thyroid gland (hyperthyroidism). You use products that contain nicotine or tobacco, such as cigarettes, e-cigarettes and chewing tobacco, or you drink a lot of alcohol. You are taking medicines that weaken the bones, such as steroids. What are the signs or symptoms? This condition does not cause any symptoms. You may have a slightly higher risk for bone breaks (fractures), so getting fractures more easily than normal may be  an indication ofosteopenia. How is this diagnosed? This condition may be diagnosed based on an X-ray exam that measures bone density (dual-energy X-ray absorptiometry, or DEXA). This test can measure bone density in your hips, spine, and wrists. Osteopenia has no symptoms, so this condition is usually diagnosed after a routine bone density screening test is done for osteoporosis. This routine screening is usually done for: Women who are age 8 or older. Men who are age 47 or older. If you have risk factors for osteopenia, you may have the screening test at anearlier age. How is this treated? Making dietary and lifestyle changes can lower your risk for osteoporosis. If you have severe osteopenia that is close to becoming osteoporosis, this condition can be treated with medicines and dietary supplements such as calciumand vitamin D. These supplements help to rebuild bone density. Follow these instructions at home: Eating and drinking Eat a diet that is high in calcium and vitamin D. Calcium is found in dairy products, beans, salmon, and leafy green vegetables like spinach and broccoli. Look for foods that have vitamin D and calcium added to them (fortified foods), such as orange juice, cereal, and bread.  Lifestyle Do 30 minutes or more of a weight-bearing exercise every day, such as walking, jogging, or playing a sport. These types of exercises strengthen the bones. Do not use any products that contain nicotine or tobacco, such as cigarettes, e-cigarettes, and chewing tobacco. If you need help quitting, ask your health care provider. Do not drink alcohol if: Your health care provider tells you not to drink. You are pregnant, may  be pregnant, or are planning to become pregnant. If you drink alcohol: Limit how much you use to: 0-1 drink a day for women. 0-2 drinks a day for men. Be aware of how much alcohol is in your drink. In the [place] one drink equals one 12 oz bottle of beer (355 mL), one  5 oz glass of wine (148 mL), or one 1 oz glass of hard liquor (44 mL). General instructions Take over-the-counter and prescription medicines only as told by your health care provider. These include vitamins and supplements. Take precautions at home to lower your risk of falling, such as: Keeping rooms well-lit and free of clutter, such as cords. Installing safety rails on stairs. Using rubber mats in the bathroom or other areas that are often wet or slippery. Keep all follow-up visits. This is important. Contact a health care provider if: You have not had a bone density screening for osteoporosis and you are: A woman who is age 85 or older. A man who is age 76 or older. You are a postmenopausal woman who has not had a bone density screening for osteoporosis. You are older than age 48 and you want to know if you should have bone density screening for osteoporosis. Summary Osteopenia is a loss of thickness (density) inside the bones. Another name for osteopenia is low bone mass. Osteopenia is not a disease, but it may increase your risk for a condition that causes the bones to become thin and break more easily (osteoporosis). You may be at risk for osteopenia if you are older than age 80 or if you are a woman who went through early menopause. Osteopenia does not cause any symptoms, but it can be diagnosed with a bone density screening test. Dietary and lifestyle changes are the first treatment for osteopenia. These may lower your risk for osteoporosis. This information is not intended to replace advice given to you by your health care provider. Make sure you discuss any questions you have with your healthcare provider. Document Revised: 05/24/2020 Document Reviewed: 05/24/2020 Elsevier Patient Education  [employer].

## 2021-07-31 NOTE — Assessment & Plan Note (Signed)
Continue collaboration with GYN at [facility]. 

## 2021-07-31 NOTE — Progress Notes (Signed)
BP 104/64 (BP Location: Left Wrist, Cuff Size: Normal)   Pulse 83   Temp 98.8 F (37.1 C) (Oral)   Ht '5\' 2"'$  (1.575 m)   Wt 139 lb 12.8 oz (63.4 kg)   SpO2 97%   BMI 25.57 kg/m    Subjective:    Patient ID: [name], female    DOB: 1961-08-13, 60 y.o.   MRN: [id number]  HPI: [name] is a 60 y.o. female presenting on 07/31/2021 for comprehensive medical examination. Current medical complaints include:none  She currently lives with: husband Menopausal Symptoms: no   Followed by GYN for menopausal symptoms and last saw Dr. Leafy Ro 05/15/21.  She receives Testosterone injections at their office and continues on Progesterone and Yuvafem.  She started on this regimen in [place] due to major symptoms.  On current regimen she is able to function better.    She takes occasional Skelaxin due to neck pain occasionally with past surgery, C5-C7 diskectomy in 2003. Continues on Macrobid after intercourse as preventative.   INSOMNIA Started on Trazodone during menopause, takes this nightly. Duration: chronic Satisfied with sleep quality: yes Difficulty falling asleep: at times Difficulty staying asleep: no Waking a few hours after sleep onset: no Early morning awakenings: no Daytime hypersomnolence: no Wakes feeling refreshed: no Good sleep hygiene: no Apnea: no Snoring: no Depressed/anxious mood: no Recent stress: no Restless legs/nocturnal leg cramps: no Chronic pain/arthritis: no History of sleep study: no Treatments attempted: Trazodone and ambien    GERD Continues on Nexium 20 MG daily, has taken for several years.   GERD control status: stable  Satisfied with current treatment? yes Heartburn frequency: none Medication side effects: no  Medication compliance: stable Dysphagia: no Odynophagia:  no Hematemesis: no Blood in stool: no EGD: yes  Depression Screen done today and results listed below:  Depression screen [facility] 2/9 07/31/2021 01/22/2021 07/12/2020 05/23/2020   Decreased Interest 0 0 0 0  Down, Depressed, Hopeless 0 0 0 0  PHQ - 2 Score 0 0 0 0    The patient does not have a history of falls. I did not complete a risk assessment for falls. A plan of care for falls was not documented.   Past Medical History:  History reviewed. No pertinent past medical history.  Surgical History:  Past Surgical History:  Procedure Laterality Date   AUGMENTATION MAMMAPLASTY Bilateral 2010   CERVICAL SPINE SURGERY  2003   C5-C7- double dissectomy   PAROTID GLAND TUMOR EXCISION      Medications:  Current Outpatient Medications on File Prior to Visit  Medication Sig   Biotin 5 MG CAPS Take 1 capsule by mouth in the morning and at bedtime.   Cholecalciferol 50 MCG (2000 UT) CAPS Take 1 capsule by mouth daily.   esomeprazole (NEXIUM) 20 MG capsule Take 1 capsule by mouth daily.   Estradiol 10 MCG TABS vaginal tablet Place vaginally.   ibuprofen (ADVIL) 600 MG tablet TAKE 1 TABLET BY MOUTH EVERY 6 (SIX) HOURS AS NEEDED.   melatonin 3 MG TABS tablet Take 3 mg by mouth at bedtime.   metaxalone (SKELAXIN) 800 MG tablet Take 1 tablet by mouth at bedtime. prn   nitrofurantoin, macrocrystal-monohydrate, (MACROBID) 100 MG capsule Take 1 tablet (100 MG) by mouth only after intercourse.   Probiotic Product (ALIGN PO) Take 1 tablet by mouth daily.   progesterone (PROMETRIUM) 200 MG capsule Take 1 capsule by mouth at bedtime.   testosterone cypionate (DEPOTESTOSTERONE CYPIONATE) 200 MG/ML injection Inject 200 mg  into the muscle every 28 (twenty-eight) days.   No current facility-administered medications on file prior to visit.    Allergies:  No Known Allergies  Social History:  Social History   Socioeconomic History   Marital status: Married    Spouse name: Not on file   Number of children: Not on file   Years of education: Not on file   Highest education level: Not on file  Occupational History   Not on file  Tobacco Use   Smoking status: Never    Smokeless tobacco: Never  Vaping Use   Vaping Use: Never used  Substance and Sexual Activity   Alcohol use: Yes    Comment: ocassional   Drug use: Never   Sexual activity: Yes  Other Topics Concern   Not on file  Social History Narrative   Not on file   Social Determinants of Health   Financial Resource Strain: Not on file  Food Insecurity: Not on file  Transportation Needs: Not on file  Physical Activity: Not on file  Stress: Not on file  Social Connections: Not on file  Intimate Partner Violence: Not on file   Social History   Tobacco Use  Smoking Status Never  Smokeless Tobacco Never   Social History   Substance and Sexual Activity  Alcohol Use Yes   Comment: ocassional    Family History:  Family History  Problem Relation Age of Onset   Lung cancer Mother    Thyroid disease Mother    Hypertension Mother    Hyperlipidemia Mother    CAD Mother        stent x 1   Osteoporosis Mother    Leukemia Father 10   Rheum arthritis Sister    Melanoma Sister    Osteopenia Sister    Autoimmune disease Daughter    Colon cancer Maternal Uncle    Heart disease Maternal Grandmother    Colon cancer Maternal Grandfather    Heart disease Maternal Grandfather    Stroke Maternal Grandfather    Heart disease Paternal Grandfather     Past medical history, surgical history, medications, allergies, family history and social history reviewed with patient today and changes made to appropriate areas of the chart.   Review of Systems - negative All other ROS negative except what is listed above and in the HPI.      Objective:    BP 104/64 (BP Location: Left Wrist, Cuff Size: Normal)   Pulse 83   Temp 98.8 F (37.1 C) (Oral)   Ht '5\' 2"'$  (1.575 m)   Wt 139 lb 12.8 oz (63.4 kg)   SpO2 97%   BMI 25.57 kg/m   Wt Readings from Last 3 Encounters:  07/31/21 139 lb 12.8 oz (63.4 kg)  01/22/21 157 lb 3.2 oz (71.3 kg)  07/12/20 158 lb 9.6 oz (71.9 kg)    Physical Exam Vitals  and nursing note reviewed. Exam conducted with a chaperone present.  Constitutional:      General: She is awake. She is not in acute distress.    Appearance: She is well-developed. She is not ill-appearing.  HENT:     Head: Normocephalic and atraumatic.     Right Ear: Hearing, tympanic membrane, ear canal and external ear normal. No drainage.     Left Ear: Hearing, tympanic membrane, ear canal and external ear normal. No drainage.     Nose: Nose normal.     Right Sinus: No maxillary sinus tenderness or frontal sinus tenderness.  Left Sinus: No maxillary sinus tenderness or frontal sinus tenderness.     Mouth/Throat:     Mouth: Mucous membranes are moist.     Pharynx: Oropharynx is clear. Uvula midline. No pharyngeal swelling, oropharyngeal exudate or posterior oropharyngeal erythema.  Eyes:     General: Lids are normal.        Right eye: No discharge.        Left eye: No discharge.     Extraocular Movements: Extraocular movements intact.     Conjunctiva/sclera: Conjunctivae normal.     Pupils: Pupils are equal, round, and reactive to light.     Visual Fields: Right eye visual fields normal and left eye visual fields normal.  Neck:     Thyroid: No thyromegaly.     Vascular: No carotid bruit.     Trachea: Trachea normal.  Cardiovascular:     Rate and Rhythm: Normal rate and regular rhythm.     Heart sounds: Normal heart sounds. No murmur heard.   No gallop.  Pulmonary:     Effort: Pulmonary effort is normal. No accessory muscle usage or respiratory distress.     Breath sounds: Normal breath sounds.  Chest:  Breasts:    Right: Normal. No axillary adenopathy or supraclavicular adenopathy.     Left: Normal. No axillary adenopathy or supraclavicular adenopathy.  Abdominal:     General: Bowel sounds are normal.     Palpations: Abdomen is soft. There is no hepatomegaly or splenomegaly.     Tenderness: There is no abdominal tenderness.  Musculoskeletal:        General: Normal  range of motion.     Cervical back: Normal range of motion and neck supple.     Right lower leg: No edema.     Left lower leg: No edema.  Lymphadenopathy:     Head:     Right side of head: No submental, submandibular, tonsillar, preauricular or posterior auricular adenopathy.     Left side of head: No submental, submandibular, tonsillar, preauricular or posterior auricular adenopathy.     Cervical: No cervical adenopathy.     Upper Body:     Right upper body: No supraclavicular, axillary or pectoral adenopathy.     Left upper body: No supraclavicular, axillary or pectoral adenopathy.  Skin:    General: Skin is warm and dry.     Capillary Refill: Capillary refill takes less than 2 seconds.     Findings: No rash.  Neurological:     Mental Status: She is alert and oriented to person, place, and time.     Cranial Nerves: Cranial nerves are intact.     Gait: Gait is intact.     Deep Tendon Reflexes: Reflexes are normal and symmetric.     Reflex Scores:      Brachioradialis reflexes are 2+ on the right side and 2+ on the left side.      Patellar reflexes are 2+ on the right side and 2+ on the left side. Psychiatric:        Attention and Perception: Attention normal.        Mood and Affect: Mood normal.        Speech: Speech normal.        Behavior: Behavior normal. Behavior is cooperative.        Thought Content: Thought content normal.        Judgment: Judgment normal.   Results for orders placed or performed in visit on 07/13/20  HM MAMMOGRAPHY  Result Value Ref Range   HM Mammogram 0-4 Bi-Rad 0-4 Bi-Rad, Self Reported Normal  HM COLONOSCOPY  Result Value Ref Range   HM Colonoscopy See Report (in chart) See Report (in chart), Patient Reported      Assessment & Plan:   Problem List Items Addressed This Visit       Digestive   GERD (gastroesophageal reflux disease)    Chronic, stable.  Continue daily Nexium and adjust regimen as needed.  Had colonoscopy and EGD in 2020.  Mag  level today.       Relevant Orders   CBC with Differential/Platelet   Magnesium     Musculoskeletal and Integument   Osteopenia    Per patient noted on DEXA in 2019 and recent DEXA 01/02/21.  Continue daily Vitamin D3 and Calcium supplements.  Repeat DEXA in 2 years, family history and hormone treatment.  Vit D level today.       Relevant Orders   VITAMIN D 25 Hydroxy (Vit-D Deficiency, Fractures)     Genitourinary   Vaginal atrophy    Ongoing, continue post coital abx prophylaxis as needed to avoid UTI.  Refills as needed.         Other   Insomnia - Primary    Chronic, stable.  Continue current medication regimen, Trazodone and Melatonin, adjust as needed.  Continue focus on good sleep hygiene techniques at home.  Return in 6 months.       Relevant Orders   TSH   Postmenopausal hormone therapy    [facility] collaboration [facility] at [facility].       Herpes    History of with occasional flares, currently no flares.  Valtrex refill sent in.       Relevant Medications   valACYclovir (VALTREX) 1000 MG tablet   Healthcare maintenance    Reviewed with patient: - Pneumococcal will start in 2025 - Covid vaccines -- up to date, works in healthcare - Shingrix -- will obtain over upcoming months - Colonoscopy -- up to date - Mammogram -- due November 2022 - DEXA -- due January 2024 - Pap due November 2026 with GYN       Other Visit Diagnoses     Vitamin D deficiency       Check level today and continue supplement.   Relevant Orders   VITAMIN D 25 Hydroxy (Vit-D Deficiency, Fractures)   Elevated LDL cholesterol level       Check lipid panel today and start supplement as needed.   Relevant Orders   Comprehensive metabolic panel   Lipid Panel w/o Chol/HDL Ratio   Annual physical exam       Annual labs today and health maintenance reviewed with patient.        Follow up plan: Return in about 1 year (around 07/31/2022) for Annual physical.  LABORATORY  TESTING:  - Pap smear: up to date  IMMUNIZATIONS:   - Tdap: Tetanus vaccination status reviewed: last tetanus booster within 10 years. - Influenza: Up to date - Pneumovax: Not applicable - Prevnar: Not applicable - HPV: Not applicable - Zostavax vaccine: will obtain in office in upcoming months  SCREENING: -Mammogram: Up To Date - Colonoscopy: Up to date  - Bone Density: Up To Date -- on 01/02/21 -- osteopenia -Hearing Test: Not applicable  -Spirometry: Not applicable   PATIENT COUNSELING:   Advised to take 1 mg of folate supplement per day if capable of pregnancy.   Sexuality: Discussed sexually transmitted diseases, partner selection,  use of condoms, avoidance of unintended pregnancy  and contraceptive alternatives.   Advised to avoid cigarette smoking.  I discussed with the patient that most people either abstain from alcohol or drink within safe limits (<=14/week and <=4 drinks/occasion for males, <=7/weeks and <= 3 drinks/occasion for females) and that the risk for alcohol disorders and other health effects rises proportionally with the number of drinks per week and how often a drinker exceeds daily limits.  Discussed cessation/primary prevention of drug use and availability of treatment for abuse.   Diet: Encouraged to adjust caloric intake to maintain  or achieve ideal body weight, to reduce intake of dietary saturated fat and total fat, to limit sodium intake by avoiding high sodium foods and not adding table salt, and to maintain adequate dietary potassium and calcium preferably from fresh fruits, vegetables, and low-fat dairy products.    Stressed the importance of regular exercise  Injury prevention: Discussed safety belts, safety helmets, smoke detector, smoking near bedding or upholstery.   Dental health: Discussed importance of regular tooth brushing, flossing, and dental visits.    NEXT PREVENTATIVE PHYSICAL DUE IN 1 YEAR. Return in about 1 year (around 07/31/2022)  for Annual physical.

## 2021-07-31 NOTE — Assessment & Plan Note (Signed)
Reviewed with patient: - Pneumococcal will start in 2025 - Covid vaccines -- up to date, works in healthcare - Shingrix -- will obtain over upcoming months - Colonoscopy -- up to date - Mammogram -- due November 2022 - DEXA -- due January 2024 - Pap due November 2026 with GYN

## 2021-07-31 NOTE — Assessment & Plan Note (Signed)
Per patient noted on DEXA in 2019 and recent DEXA 01/02/21.  Continue daily Vitamin D3 and Calcium supplements.  Repeat DEXA in 2 years, family history and hormone treatment.  Vit D level today.

## 2021-08-01 LAB — LIPID PANEL W/O CHOL/HDL RATIO
Cholesterol, Total: 179 mg/dL (ref 100–199)
HDL: 62 mg/dL (ref >39)
LDL Chol Calc (NIH): 103 mg/dL — ABNORMAL HIGH (ref 0–99)
Triglycerides: 74 mg/dL (ref 0–149)
VLDL Cholesterol Cal: 14 mg/dL (ref 5–40)

## 2021-08-01 LAB — COMPREHENSIVE METABOLIC PANEL
ALT: 11 IU/L (ref 0–32)
AST: 20 IU/L (ref 0–40)
Albumin/Globulin Ratio: 2.4 — ABNORMAL HIGH (ref 1.2–2.2)
Albumin: 4.6 g/dL (ref 3.8–4.9)
Alkaline Phosphatase: 94 IU/L (ref 44–121)
BUN/Creatinine Ratio: 21 (ref 12–28)
BUN: 15 mg/dL (ref 8–27)
Bilirubin Total: 0.2 mg/dL (ref 0.0–1.2)
CO2: 25 mmol/L (ref 20–29)
Calcium: 9 mg/dL (ref 8.7–10.3)
Chloride: 102 mmol/L (ref 96–106)
Creatinine, Ser: 0.73 mg/dL (ref 0.57–1.00)
Globulin, Total: 1.9 g/dL (ref 1.5–4.5)
Glucose: 70 mg/dL (ref 65–99)
Potassium: 4.7 mmol/L (ref 3.5–5.2)
Sodium: 139 mmol/L (ref 134–144)
Total Protein: 6.5 g/dL (ref 6.0–8.5)
eGFR: 94 mL/min/{1.73_m2} (ref >59)

## 2021-08-01 LAB — CBC WITH DIFFERENTIAL/PLATELET
Basophils Absolute: 0 10*3/uL (ref 0.0–0.2)
Basos: 0 %
EOS (ABSOLUTE): 0 10*3/uL (ref 0.0–0.4)
Eos: 1 %
Hematocrit: 41.4 % (ref 34.0–46.6)
Hemoglobin: 13.8 g/dL (ref 11.1–15.9)
Immature Grans (Abs): 0 10*3/uL (ref 0.0–0.1)
Immature Granulocytes: 0 %
Lymphocytes Absolute: 1.8 10*3/uL (ref 0.7–3.1)
Lymphs: 37 %
MCH: 29.5 pg (ref 26.6–33.0)
MCHC: 33.3 g/dL (ref 31.5–35.7)
MCV: 89 fL (ref 79–97)
Monocytes Absolute: 0.4 10*3/uL (ref 0.1–0.9)
Monocytes: 9 %
Neutrophils Absolute: 2.6 10*3/uL (ref 1.4–7.0)
Neutrophils: 53 %
Platelets: 231 10*3/uL (ref 150–450)
RBC: 4.68 x10E6/uL (ref 3.77–5.28)
RDW: 12 % (ref 11.7–15.4)
WBC: 5 10*3/uL (ref 3.4–10.8)

## 2021-08-01 LAB — VITAMIN D 25 HYDROXY (VIT D DEFICIENCY, FRACTURES): Vit D, 25-Hydroxy: 44.2 ng/mL (ref 30.0–100.0)

## 2021-08-01 LAB — TSH: TSH: 2.95 u[IU]/mL (ref 0.450–4.500)

## 2021-08-01 LAB — MAGNESIUM: Magnesium: 2.1 mg/dL (ref 1.6–2.3)

## 2021-08-01 NOTE — Progress Notes (Signed)
Contacted via MyChart The 10-year ASCVD risk score Mikey Bussing DC Jr., et al., 2013) is: 1.9%   Values used to calculate the score:     Age: 60 years     Sex: Female     Is Non-Hispanic African American: No     Diabetic: No     Tobacco smoker: No     Systolic Blood Pressure: 123456 mmHg     Is BP treated: No     HDL Cholesterol: 62 mg/dL     Total Cholesterol: 179 mg/dL   Good morning Nani, it was wonderful seeing you.  Your labs have returned and overall look great with exception of cholesterol.  Your LDL is above normal. The LDL is the bad cholesterol. Over time and in combination with inflammation and other factors, this contributes to plaque which in turn may lead to stroke and/or heart attack down the road. Sometimes high LDL is primarily genetic, and people might be eating all the right foods but still have high numbers. Other times, there is room for improvement in one's diet and eating healthier can bring this number down and potentially reduce one's risk of heart attack and/or stroke.   To reduce your LDL, Remember - more fruits and vegetables, more fish, and limit red meat and dairy products. More soy, nuts, beans, barley, lentils, oats and plant sterol ester enriched margarine instead of butter. I also encourage eliminating sugar and processed food. Remember, shop on the outside of the grocery store and visit your [employer]. If you would like to talk with me about dietary changes for your cholesterol, please let me know. We should recheck your cholesterol in 12 months fasting.  Any questions? Keep being amazing!  Thank you for allowing me to participate in your care.  I appreciate you. Kindest regards, Leni Pankonin

## 2021-08-05 ENCOUNTER — Telehealth: Payer: Self-pay | Admitting: Orthopedic Surgery

## 2021-08-05 ENCOUNTER — Other Ambulatory Visit: Payer: Self-pay

## 2021-08-05 MED ORDER — VALACYCLOVIR HCL 500 MG PO TABS
ORAL_TABLET | ORAL | 4 refills | Status: AC
  Filled 2021-08-05: qty 60, 30d supply, fill #0

## 2021-08-05 MED ORDER — ESTRADIOL 10 MCG VA TABS
ORAL_TABLET | VAGINAL | 2 refills | Status: DC
  Filled 2021-08-05: qty 18, 63d supply, fill #0
  Filled 2021-10-15: qty 18, 63d supply, fill #1

## 2021-08-05 NOTE — Telephone Encounter (Signed)
Patient/Employee of [employer] would like to secure a surgery date for her right shoulder October 3rd or after.  Please provide surgery sheet.

## 2021-08-08 NOTE — Telephone Encounter (Signed)
Done thx

## 2021-08-14 ENCOUNTER — Telehealth: Payer: Self-pay | Admitting: Orthopedic Surgery

## 2021-08-14 ENCOUNTER — Encounter: Payer: Self-pay | Admitting: Orthopedic Surgery

## 2021-08-14 NOTE — Telephone Encounter (Signed)
Patient is scheduled for right shoulder arthroscopy, debridement, biceps tenodesis, and mini open rotator cuff tear repair on 09-24-21 at [facility]. Patient would like to know if she will be able to get prescription meds prior to the surgery on that Tuesday.    Cb  [phone]

## 2021-08-14 NOTE — Telephone Encounter (Signed)
[employer] forms received. To [employer]. 

## 2021-08-27 ENCOUNTER — Telehealth: Payer: Self-pay | Admitting: Orthopedic Surgery

## 2021-08-27 NOTE — Telephone Encounter (Signed)
[employer] forms received. To [employer]. 

## 2021-08-29 ENCOUNTER — Encounter: Payer: Self-pay | Admitting: Orthopedic Surgery

## 2021-08-29 ENCOUNTER — Other Ambulatory Visit: Payer: Self-pay

## 2021-09-10 ENCOUNTER — Other Ambulatory Visit: Payer: Self-pay

## 2021-09-10 MED ORDER — INFLUENZA VAC SPLIT QUAD 0.5 ML IM SUSY
PREFILLED_SYRINGE | INTRAMUSCULAR | 0 refills | Status: DC
  Filled 2021-09-10: qty 0.5, 1d supply, fill #0

## 2021-09-10 NOTE — Progress Notes (Signed)
Surgical Instructions    Your procedure is scheduled on 09/24/21.  Report to [facility] Main Entrance "A" at 08:00 A.M., then check in with the Admitting office.  Call this number if you have problems the morning of surgery:  [phone]   If you have any questions prior to your surgery date call [phone]: Open Monday-Friday 8am-4pm    Remember:  Do not eat after midnight the night before your surgery  You may drink clear liquids until 07:00am the morning of your surgery.   Clear liquids allowed are: Water, Non-Citrus Juices (without pulp), Carbonated Beverages, Clear Tea, Black Coffee ONLY (NO MILK, CREAM OR POWDERED CREAMER of any kind), and Gatorade  Patient Instructions  The night before surgery:  No food after midnight. ONLY clear liquids after midnight  The day of surgery (if you do NOT have diabetes):  Drink ONE (1) Pre-Surgery Clear Ensure by 7:00am the morning of surgery. Drink in one sitting. Do not sip.  This drink was given to you during your hospital  pre-op appointment visit. Nothing else to drink after completing the  Pre-Surgery Clear Ensure.          If you have questions, please contact your surgeon's office.     Take these medicines the morning of surgery with A SIP OF WATER  esomeprazole (NEXIUM)  IF NEEDED: valACYclovir (VALTREX) nitrofurantoin, macrocrystal-monohydrate, (MACROBID)   As of today, STOP taking any Aspirin (unless otherwise instructed by your surgeon) Aleve, Naproxen, Ibuprofen, Motrin, Advil, Goody's, BC's, all herbal medications, fish oil, and all vitamins.          Do not wear jewelry or makeup Do not wear lotions, powders, perfumes/colognes, or deodorant. Do not shave 48 hours prior to surgery.   Do not bring valuables to the hospital.  DO Not wear nail polish, gel polish, artificial nails, or any other type of covering on natural nails including finger and toenails. If patients have artificial nails, gel coating, etc. that  need to be removed by a nail salon please have this removed prior to surgery or surgery may need to be canceled/delayed if the surgeon/ anesthesia feels like the patient is unable to be adequately monitored.             [facility] is not responsible for any belongings or valuables.  Do NOT Smoke (Tobacco/Vaping)  24 hours prior to your procedure If you use a CPAP at night, you may bring your mask for your overnight stay.   Contacts, glasses, dentures or bridgework may not be worn into surgery, please bring cases for these belongings   For patients admitted to the hospital, discharge time will be determined by your treatment team.   Patients discharged the day of surgery will not be allowed to drive home, and someone needs to stay with them for 24 hours.  NO VISITORS WILL BE ALLOWED IN PRE-OP WHERE PATIENTS GET READY FOR SURGERY.  ONLY 1 SUPPORT PERSON MAY BE PRESENT WHILE YOU ARE IN SURGERY.  IF YOU ARE TO BE ADMITTED, ONCE YOU ARE IN YOUR ROOM YOU WILL BE ALLOWED TWO (2) VISITORS.  Minor children may have two parents present. Special consideration for safety and communication needs will be reviewed on a case by case basis.  Special instructions:    Oral Hygiene is also important to reduce your risk of infection.  Remember - BRUSH YOUR TEETH THE MORNING OF SURGERY WITH YOUR REGULAR TOOTHPASTE   [facility]- Preparing For Surgery  Before surgery, you can  play an important role. Because skin is not sterile, your skin needs to be as free of germs as possible. You can reduce the number of germs on your skin by washing with CHG (chlorahexidine gluconate) Soap before surgery.  CHG is an antiseptic cleaner which kills germs and bonds with the skin to continue killing germs even after washing.     Please do not use if you have an allergy to CHG or antibacterial soaps. If your skin becomes reddened/irritated stop using the CHG.  Do not shave (including legs and underarms) for at least 48 hours  prior to first CHG shower. It is OK to shave your face.  Please follow these instructions carefully.     Shower the NIGHT BEFORE SURGERY and the MORNING OF SURGERY with CHG Soap.   If you chose to wash your hair, wash your hair first as usual with your normal shampoo. After you shampoo, rinse your hair and body thoroughly to remove the shampoo.  Then [employer] and genitals (private parts) with your normal soap and rinse thoroughly to remove soap.  After that Use CHG Soap as you would any other liquid soap. You can apply CHG directly to the skin and wash gently with a scrungie or a clean washcloth.   Apply the CHG Soap to your body ONLY FROM THE NECK DOWN.  Do not use on open wounds or open sores. Avoid contact with your eyes, ears, mouth and genitals (private parts). Wash Face and genitals (private parts)  with your normal soap.   Wash thoroughly, paying special attention to the area where your surgery will be performed.  Thoroughly rinse your body with warm water from the neck down.  DO NOT shower/wash with your normal soap after using and rinsing off the CHG Soap.  Pat yourself dry with a CLEAN TOWEL.  Wear CLEAN PAJAMAS to bed the night before surgery  Place CLEAN SHEETS on your bed the night before your surgery  DO NOT SLEEP WITH PETS.   Day of Surgery: Take a shower with CHG soap. Wear Clean/Comfortable clothing the morning of surgery Do not apply any deodorants/lotions.   Remember to brush your teeth WITH YOUR REGULAR TOOTHPASTE.   Please read over the following fact sheets that you were given.

## 2021-09-11 ENCOUNTER — Encounter (HOSPITAL_COMMUNITY): Payer: Self-pay

## 2021-09-11 ENCOUNTER — Other Ambulatory Visit: Payer: Self-pay

## 2021-09-11 ENCOUNTER — Encounter (HOSPITAL_COMMUNITY)
Admission: RE | Admit: 2021-09-11 | Discharge: 2021-09-11 | Disposition: A | Discharge status: Home / Self Care | Payer: 59 | Source: Ambulatory Visit | Attending: Orthopedic Surgery | Admitting: Orthopedic Surgery

## 2021-09-11 DIAGNOSIS — Z01812 Encounter for preprocedural laboratory examination: Secondary | ICD-10-CM | POA: Insufficient documentation

## 2021-09-11 HISTORY — DX: Other specified postprocedural states: R11.2

## 2021-09-11 HISTORY — DX: Other complications of anesthesia, initial encounter: T88.59XA

## 2021-09-11 HISTORY — DX: Other specified postprocedural states: Z98.890

## 2021-09-11 LAB — BASIC METABOLIC PANEL
Anion gap: 8 (ref 5–15)
BUN: 17 mg/dL (ref 6–20)
CO2: 28 mmol/L (ref 22–32)
Calcium: 9.5 mg/dL (ref 8.9–10.3)
Chloride: 100 mmol/L (ref 98–111)
Creatinine, Ser: 0.74 mg/dL (ref 0.44–1.00)
GFR, Estimated: >60 mL/min (ref >60)
Glucose, Bld: 110 mg/dL — ABNORMAL HIGH (ref 70–99)
Potassium: 4 mmol/L (ref 3.5–5.1)
Sodium: 136 mmol/L (ref 135–145)

## 2021-09-11 LAB — CBC
HCT: 41.7 % (ref 36.0–46.0)
Hemoglobin: 13.4 g/dL (ref 12.0–15.0)
MCH: 28.7 pg (ref 26.0–34.0)
MCHC: 32.1 g/dL (ref 30.0–36.0)
MCV: 89.3 fL (ref 80.0–100.0)
Platelets: 196 10*3/uL (ref 150–400)
RBC: 4.67 MIL/uL (ref 3.87–5.11)
RDW: 12.1 % (ref 11.5–15.5)
WBC: 4.7 10*3/uL (ref 4.0–10.5)
nRBC: 0 % (ref 0.0–0.2)

## 2021-09-11 NOTE — Progress Notes (Addendum)
PCP - Marnee Guarneri NP Cardiologist - Denies  Chest x-ray - Not indicated EKG - Not indicated  ERAS Protcol - Yes  PRE-SURGERY Ensure    Anesthesia review: No  Patient denies shortness of breath, fever, cough and chest pain at PAT appointment   All instructions explained to the patient, with a verbal understanding of the material. Patient agrees to go over the instructions while at home for a better understanding. The opportunity to ask questions was provided.   Corrected patient instructions for patient to arrive 9am on 09/24/21

## 2021-09-12 ENCOUNTER — Other Ambulatory Visit: Payer: Self-pay | Admitting: Surgical

## 2021-09-12 ENCOUNTER — Other Ambulatory Visit: Payer: Self-pay

## 2021-09-12 MED ORDER — GABAPENTIN 300 MG PO CAPS
300.0000 mg | ORAL_CAPSULE | Freq: Three times a day (TID) | ORAL | 0 refills | Status: DC
  Filled 2021-09-12: qty 30, 10d supply, fill #0

## 2021-09-12 MED ORDER — METHOCARBAMOL 500 MG PO TABS
500.0000 mg | ORAL_TABLET | Freq: Three times a day (TID) | ORAL | 0 refills | Status: DC | PRN
  Filled 2021-09-12: qty 30, 10d supply, fill #0

## 2021-09-12 MED ORDER — CELECOXIB 100 MG PO CAPS
100.0000 mg | ORAL_CAPSULE | Freq: Two times a day (BID) | ORAL | 0 refills | Status: DC
  Filled 2021-09-12: qty 60, 30d supply, fill #0

## 2021-09-12 MED ORDER — OXYCODONE-ACETAMINOPHEN 5-325 MG PO TABS
1.0000 | ORAL_TABLET | ORAL | 0 refills | Status: DC | PRN
  Filled 2021-09-12: qty 30, 5d supply, fill #0

## 2021-09-13 ENCOUNTER — Telehealth: Payer: Self-pay | Admitting: Orthopedic Surgery

## 2021-09-13 ENCOUNTER — Encounter: Payer: Self-pay | Admitting: Orthopedic Surgery

## 2021-09-13 NOTE — Telephone Encounter (Signed)
IC patient, spoke about [employer] forms. She stated she had emailed auth to [place]. I advised her that [place] no longer works for [employer]. So, she is mailing auth and check to my attention. Advised oncer [employer] has received the check&auth, they can start completing forms.

## 2021-09-13 NOTE — Telephone Encounter (Signed)
[employer] forms received. To [employer]. ?

## 2021-09-13 NOTE — Telephone Encounter (Signed)
Called patient

## 2021-09-17 ENCOUNTER — Encounter: Payer: Self-pay | Admitting: Orthopedic Surgery

## 2021-09-19 ENCOUNTER — Ambulatory Visit: Payer: 59 | Admitting: Dermatology

## 2021-09-19 ENCOUNTER — Other Ambulatory Visit: Payer: Self-pay

## 2021-09-19 ENCOUNTER — Encounter: Payer: Self-pay | Admitting: Orthopedic Surgery

## 2021-09-19 DIAGNOSIS — D821 Di George's syndrome: Secondary | ICD-10-CM

## 2021-09-19 DIAGNOSIS — Z808 Family history of malignant neoplasm of other organs or systems: Secondary | ICD-10-CM | POA: Diagnosis not present

## 2021-09-19 DIAGNOSIS — D1800 Hemangioma unspecified site: Secondary | ICD-10-CM | POA: Diagnosis not present

## 2021-09-19 DIAGNOSIS — D229 Melanocytic nevi, unspecified: Secondary | ICD-10-CM

## 2021-09-19 DIAGNOSIS — L814 Other melanin hyperpigmentation: Secondary | ICD-10-CM

## 2021-09-19 DIAGNOSIS — L570 Actinic keratosis: Secondary | ICD-10-CM

## 2021-09-19 DIAGNOSIS — L578 Other skin changes due to chronic exposure to nonionizing radiation: Secondary | ICD-10-CM | POA: Diagnosis not present

## 2021-09-19 DIAGNOSIS — Z1283 Encounter for screening for malignant neoplasm of skin: Secondary | ICD-10-CM

## 2021-09-19 MED ORDER — FLUOROURACIL 5 % EX CREA: TOPICAL_CREAM | CUTANEOUS | 0 refills | Status: DC

## 2021-09-19 NOTE — Progress Notes (Signed)
New Patient Visit  Subjective  [name] is a 60 y.o. female who presents for the following: Annual Exam (Fhx of MM in sister. Possible hx of AK on the lip tx in the past with PDT.). The patient presents for Total-Body Skin Exam (TBSE) for skin cancer screening and mole check.   The following portions of the chart were reviewed this encounter and updated as appropriate:   Tobacco  Allergies  Meds  Problems  Med Hx  Surg Hx  Fam Hx      Review of Systems:  No other skin or systemic complaints except as noted in HPI or Assessment and Plan.  Objective  Well appearing patient in no apparent distress; mood and affect are within normal limits.  A full examination was performed including scalp, head, eyes, ears, nose, lips, neck, chest, axillae, abdomen, back, buttocks, bilateral upper extremities, bilateral lower extremities, hands, feet, fingers, toes, fingernails, and toenails. All findings within normal limits unless otherwise noted below.  R chest x 1 Erythematous thin papules/macules with gritty scale.   Assessment & Plan  AK (actinic keratosis) R chest x 1  Actinic keratoses are precancerous spots that appear secondary to cumulative UV radiation exposure/sun exposure over time. They are chronic with expected duration over 1 year. A portion of actinic keratoses will progress to squamous cell carcinoma of the skin. It is not possible to reliably predict which spots will progress to skin cancer and so treatment is recommended to prevent development of skin cancer.  Recommend daily broad spectrum sunscreen SPF 30+ to sun-exposed areas, reapply every 2 hours as needed.  Recommend staying in the shade or wearing long sleeves, sun glasses (UVA+UVB protection) and wide brim hats (4-inch brim around the entire circumference of the hat). Call for new or changing lesions.  Patient scheduled for rotator cuff surgery tomorrow. Defer LN2 today. Discussed 5FU/Calcipotriene cream to aa  BID x 7 days. Use after the holidays.   Prescription sent to [facility]. Patient provided with contact information for pharmacy and advised the pharmacy will mail the prescription to their home. Patient provided with handout reviewing treatment course and side effects and advised to call or message [place] on MyChart with any concerns.  fluorouracil (EFUDEX) 5 % cream - R chest x 1 Apply to actinic keratosis on the R chest BID x 7 days. Wait until after the holidays to start treatment.   Lentigines - Scattered tan macules - Due to sun exposure - Benign-appearing, observe - Recommend daily broad spectrum sunscreen SPF 30+ to sun-exposed areas, reapply every 2 hours as needed. - Call for any changes  Seborrheic Keratoses - Stuck-on, waxy, tan-brown papules and/or plaques  - Benign-appearing - Discussed benign etiology and prognosis. - Observe - Call for any changes  Melanocytic Nevi - Tan-brown and/or pink-flesh-colored symmetric macules and papules - Benign appearing on exam today - Observation - Call clinic for new or changing moles - Recommend daily use of broad spectrum spf 30+ sunscreen to sun-exposed areas.   Hemangiomas - Red papules - Discussed benign nature - Observe - Call for any changes  Actinic Damage - Chronic condition, secondary to cumulative UV/sun exposure - diffuse scaly erythematous macules with underlying dyspigmentation - Recommend daily broad spectrum sunscreen SPF 30+ to sun-exposed areas, reapply every 2 hours as needed.  - Staying in the shade or wearing long sleeves, sun glasses (UVA+UVB protection) and wide brim hats (4-inch brim around the entire circumference of the hat) are also recommended for sun  protection.  - Call for new or changing lesions.  Skin cancer screening performed today.  Return in about 1 year (around 09/19/2022) for TBSE; 5-6 mths AK follow up.  Luther Redo, CMA, am acting as scribe for Forest Gleason, MD  .   Documentation: I have reviewed the above documentation for accuracy and completeness, and I agree with the above.  Forest Gleason, MD

## 2021-09-19 NOTE — Patient Instructions (Addendum)
Recommend taking Heliocare sun protection supplement daily in sunny weather for additional sun protection. For maximum protection on the sunniest days, you can take up to 2 capsules of regular Heliocare OR take 1 capsule of Heliocare Ultra. For prolonged exposure (such as a full day in the sun), you can repeat your dose of the supplement 4 hours after your first dose. Heliocare can be purchased at [facility] or at [email or web address].   - Start 5-fluorouracil/calcipotriene cream twice a day for 7 days to affected areas including the right chest. Prescription sent to [facility]. Patient provided with contact information for pharmacy and advised the pharmacy will mail the prescription to their home. Patient provided with handout reviewing treatment course and side effects and advised to call or message [place] on MyChart with any concerns.   If you have any questions or concerns for your doctor, please call our main line at [phone] and press option 4 to reach your doctor's medical assistant. If no one answers, please leave a voicemail as directed and we will return your call as soon as possible. Messages left after 4 pm will be answered the following business day.   You may also send [place] a message via [place]. We typically respond to MyChart messages within 1-2 business days.  For prescription refills, please ask your pharmacy to contact our office. Our fax number is [phone].  If you have an urgent issue when the clinic is closed that cannot wait until the next business day, you can page your doctor at the number below.    Please note that while we do our best to be available for urgent issues outside of office hours, we are not available 24/7.   If you have an urgent issue and are unable to reach [place], you may choose to seek medical care at your doctor's office, retail clinic, urgent care center, or emergency room.  If you have a medical emergency, please immediately call 911 or go to  the emergency department.  Pager Numbers  - Dr. Nehemiah Massed: [phone]  - Dr. Laurence Ferrari: [phone]  - Dr. Nicole Kindred: [phone]  In the event of inclement weather, please call our main line at [phone] for an update on the status of any delays or closures.  Dermatology Medication Tips: Please keep the boxes that topical medications come in in order to help keep track of the instructions about where and how to use these. Pharmacies typically print the medication instructions only on the boxes and not directly on the medication tubes.   If your medication is too expensive, please contact our office at [phone] option 4 or send [place] a message through [place].   We are unable to tell what your co-pay for medications will be in advance as this is different depending on your insurance coverage. However, we may be able to find a substitute medication at lower cost or fill out paperwork to get insurance to cover a needed medication.   If a prior authorization is required to get your medication covered by your insurance company, please allow [place] 1-2 business days to complete this process.  Drug prices often vary depending on where the prescription is filled and some pharmacies may offer cheaper prices.  The website www.goodrx.com contains coupons for medications through different pharmacies. The prices here do not account for what the cost may be with help from insurance (it may be cheaper with your insurance), but the website can give you the price if you did not use any insurance.  -  You can print the associated coupon and take it with your prescription to the pharmacy.  - You may also stop by our office during regular business hours and pick up a GoodRx coupon card.  - If you need your prescription sent electronically to a different pharmacy, notify our office through [facility] or by phone at [phone] option 4.

## 2021-09-23 ENCOUNTER — Telehealth: Payer: Self-pay | Admitting: Orthopedic Surgery

## 2021-09-23 NOTE — Telephone Encounter (Signed)
Received [employer] $ and auth via mail. To [employer] to complete form.

## 2021-09-24 ENCOUNTER — Ambulatory Visit (HOSPITAL_COMMUNITY)
Admission: RE | Admit: 2021-09-24 | Discharge: 2021-09-24 | Disposition: A | Discharge status: Home / Self Care | Payer: 59 | Attending: Orthopedic Surgery | Admitting: Orthopedic Surgery

## 2021-09-24 ENCOUNTER — Ambulatory Visit (HOSPITAL_COMMUNITY): Payer: 59 | Admitting: Certified Registered Nurse Anesthetist

## 2021-09-24 ENCOUNTER — Other Ambulatory Visit: Payer: Self-pay

## 2021-09-24 ENCOUNTER — Encounter (HOSPITAL_COMMUNITY): Payer: Self-pay | Admitting: Orthopedic Surgery

## 2021-09-24 ENCOUNTER — Encounter (HOSPITAL_COMMUNITY)
Admission: RE | Disposition: A | Discharge status: Home / Self Care | Payer: Self-pay | Source: Home / Self Care | Attending: Orthopedic Surgery

## 2021-09-24 DIAGNOSIS — M1990 Unspecified osteoarthritis, unspecified site: Secondary | ICD-10-CM | POA: Insufficient documentation

## 2021-09-24 DIAGNOSIS — S43431A Superior glenoid labrum lesion of right shoulder, initial encounter: Secondary | ICD-10-CM

## 2021-09-24 DIAGNOSIS — M75121 Complete rotator cuff tear or rupture of right shoulder, not specified as traumatic: Secondary | ICD-10-CM

## 2021-09-24 DIAGNOSIS — M75101 Unspecified rotator cuff tear or rupture of right shoulder, not specified as traumatic: Secondary | ICD-10-CM | POA: Insufficient documentation

## 2021-09-24 DIAGNOSIS — M7521 Bicipital tendinitis, right shoulder: Secondary | ICD-10-CM | POA: Insufficient documentation

## 2021-09-24 DIAGNOSIS — Z791 Long term (current) use of non-steroidal anti-inflammatories (NSAID): Secondary | ICD-10-CM | POA: Insufficient documentation

## 2021-09-24 DIAGNOSIS — M7501 Adhesive capsulitis of right shoulder: Secondary | ICD-10-CM

## 2021-09-24 DIAGNOSIS — K219 Gastro-esophageal reflux disease without esophagitis: Secondary | ICD-10-CM | POA: Diagnosis not present

## 2021-09-24 DIAGNOSIS — G8918 Other acute postprocedural pain: Secondary | ICD-10-CM | POA: Diagnosis not present

## 2021-09-24 DIAGNOSIS — G4700 Insomnia, unspecified: Secondary | ICD-10-CM | POA: Diagnosis not present

## 2021-09-24 HISTORY — PX: SHOULDER ARTHROSCOPY: SHX128

## 2021-09-24 HISTORY — PX: BICEPT TENODESIS: SHX5116

## 2021-09-24 SURGERY — ARTHROSCOPY, SHOULDER
Anesthesia: Regional | Site: Shoulder | Laterality: Right

## 2021-09-24 MED ORDER — POVIDONE-IODINE 10 % EX SWAB
2.0000 "application " | Freq: Once | CUTANEOUS | Status: AC
  Administered 2021-09-24: 2 via TOPICAL

## 2021-09-24 MED ORDER — FENTANYL CITRATE (PF) 250 MCG/5ML IJ SOLN
INTRAMUSCULAR | Status: AC
  Filled 2021-09-24: qty 5

## 2021-09-24 MED ORDER — ONDANSETRON HCL 4 MG/2ML IJ SOLN
INTRAMUSCULAR | Status: DC | PRN
  Administered 2021-09-24: 4 mg via INTRAVENOUS

## 2021-09-24 MED ORDER — CHLORHEXIDINE GLUCONATE 0.12 % MT SOLN
15.0000 mL | Freq: Once | OROMUCOSAL | Status: AC
  Administered 2021-09-24: 15 mL via OROMUCOSAL
  Filled 2021-09-24: qty 15

## 2021-09-24 MED ORDER — FENTANYL CITRATE (PF) 250 MCG/5ML IJ SOLN
INTRAMUSCULAR | Status: DC | PRN
  Administered 2021-09-24 (×2): 50 ug via INTRAVENOUS

## 2021-09-24 MED ORDER — SUGAMMADEX SODIUM 200 MG/2ML IV SOLN
INTRAVENOUS | Status: DC | PRN
  Administered 2021-09-24: 200 mg via INTRAVENOUS

## 2021-09-24 MED ORDER — CEFAZOLIN SODIUM-DEXTROSE 2-4 GM/100ML-% IV SOLN
2.0000 g | INTRAVENOUS | Status: AC
  Administered 2021-09-24: 2 g via INTRAVENOUS
  Filled 2021-09-24: qty 100

## 2021-09-24 MED ORDER — MIDAZOLAM HCL 2 MG/2ML IJ SOLN
INTRAMUSCULAR | Status: AC
  Filled 2021-09-24: qty 2

## 2021-09-24 MED ORDER — ORAL CARE MOUTH RINSE: 15.0000 mL | Freq: Once | OROMUCOSAL | Status: AC

## 2021-09-24 MED ORDER — EPINEPHRINE PF 1 MG/ML IJ SOLN
INTRAMUSCULAR | Status: DC | PRN
  Administered 2021-09-24: 4 mL

## 2021-09-24 MED ORDER — PHENYLEPHRINE HCL-NACL 20-0.9 MG/250ML-% IV SOLN
INTRAVENOUS | Status: DC | PRN
  Administered 2021-09-24: 100 ug/min via INTRAVENOUS

## 2021-09-24 MED ORDER — SUGAMMADEX SODIUM 500 MG/5ML IV SOLN
INTRAVENOUS | Status: AC
  Filled 2021-09-24: qty 5

## 2021-09-24 MED ORDER — MIDAZOLAM HCL 2 MG/2ML IJ SOLN: 2.0000 mg | Freq: Once | INTRAMUSCULAR | Status: AC

## 2021-09-24 MED ORDER — BUPIVACAINE LIPOSOME 1.3 % IJ SUSP
INTRAMUSCULAR | Status: DC | PRN
  Administered 2021-09-24: 10 mL via PERINEURAL

## 2021-09-24 MED ORDER — FENTANYL CITRATE (PF) 100 MCG/2ML IJ SOLN
INTRAMUSCULAR | Status: AC
  Administered 2021-09-24: 50 ug via INTRAVENOUS
  Filled 2021-09-24: qty 2

## 2021-09-24 MED ORDER — OXYCODONE HCL 5 MG PO TABS
ORAL_TABLET | ORAL | Status: AC
  Filled 2021-09-24: qty 1

## 2021-09-24 MED ORDER — SODIUM CHLORIDE 0.9 % IR SOLN
Status: DC | PRN
  Administered 2021-09-24 (×4): 3000 mL

## 2021-09-24 MED ORDER — ONDANSETRON HCL 4 MG/2ML IJ SOLN
INTRAMUSCULAR | Status: AC
  Filled 2021-09-24: qty 2

## 2021-09-24 MED ORDER — DEXAMETHASONE SODIUM PHOSPHATE 10 MG/ML IJ SOLN
INTRAMUSCULAR | Status: AC
  Filled 2021-09-24: qty 1

## 2021-09-24 MED ORDER — BUPIVACAINE HCL (PF) 0.5 % IJ SOLN
INTRAMUSCULAR | Status: DC | PRN
  Administered 2021-09-24: 15 mL via PERINEURAL

## 2021-09-24 MED ORDER — ACETAMINOPHEN 500 MG PO TABS
1000.0000 mg | ORAL_TABLET | Freq: Once | ORAL | Status: AC
  Administered 2021-09-24: 1000 mg via ORAL
  Filled 2021-09-24: qty 2

## 2021-09-24 MED ORDER — MIDAZOLAM HCL 2 MG/2ML IJ SOLN
INTRAMUSCULAR | Status: AC
  Administered 2021-09-24: 2 mg via INTRAVENOUS
  Filled 2021-09-24: qty 2

## 2021-09-24 MED ORDER — POVIDONE-IODINE 7.5 % EX SOLN
Freq: Once | CUTANEOUS | Status: DC
  Filled 2021-09-24: qty 118

## 2021-09-24 MED ORDER — ROCURONIUM BROMIDE 10 MG/ML (PF) SYRINGE
PREFILLED_SYRINGE | INTRAVENOUS | Status: AC
  Filled 2021-09-24: qty 10

## 2021-09-24 MED ORDER — FENTANYL CITRATE (PF) 100 MCG/2ML IJ SOLN: 25.0000 ug | INTRAMUSCULAR | Status: DC | PRN

## 2021-09-24 MED ORDER — FENTANYL CITRATE (PF) 100 MCG/2ML IJ SOLN: 50.0000 ug | Freq: Once | INTRAMUSCULAR | Status: AC

## 2021-09-24 MED ORDER — EPINEPHRINE PF 1 MG/ML IJ SOLN
INTRAMUSCULAR | Status: AC
  Filled 2021-09-24: qty 4

## 2021-09-24 MED ORDER — ROCURONIUM BROMIDE 10 MG/ML (PF) SYRINGE
PREFILLED_SYRINGE | INTRAVENOUS | Status: DC | PRN
  Administered 2021-09-24 (×2): 10 mg via INTRAVENOUS
  Administered 2021-09-24: 60 mg via INTRAVENOUS

## 2021-09-24 MED ORDER — MIDAZOLAM HCL 5 MG/5ML IJ SOLN
INTRAMUSCULAR | Status: DC | PRN
  Administered 2021-09-24: 1 mg via INTRAVENOUS

## 2021-09-24 MED ORDER — OXYCODONE HCL 5 MG/5ML PO SOLN: 5.0000 mg | Freq: Once | ORAL | Status: AC | PRN

## 2021-09-24 MED ORDER — PHENYLEPHRINE HCL (PRESSORS) 10 MG/ML IV SOLN
INTRAVENOUS | Status: DC | PRN
  Administered 2021-09-24 (×2): 80 ug via INTRAVENOUS
  Administered 2021-09-24: 160 ug via INTRAVENOUS
  Administered 2021-09-24: 240 ug via INTRAVENOUS

## 2021-09-24 MED ORDER — BUPIVACAINE HCL (PF) 0.25 % IJ SOLN
INTRAMUSCULAR | Status: AC
  Filled 2021-09-24: qty 10

## 2021-09-24 MED ORDER — DEXAMETHASONE SODIUM PHOSPHATE 10 MG/ML IJ SOLN
INTRAMUSCULAR | Status: DC | PRN
  Administered 2021-09-24: 10 mg via INTRAVENOUS

## 2021-09-24 MED ORDER — LIDOCAINE 2% (20 MG/ML) 5 ML SYRINGE
INTRAMUSCULAR | Status: AC
  Filled 2021-09-24: qty 5

## 2021-09-24 MED ORDER — LACTATED RINGERS IV SOLN: INTRAVENOUS | Status: DC

## 2021-09-24 MED ORDER — 0.9 % SODIUM CHLORIDE (POUR BTL) OPTIME
TOPICAL | Status: DC | PRN
  Administered 2021-09-24: 1000 mL

## 2021-09-24 MED ORDER — SCOPOLAMINE 1 MG/3DAYS TD PT72
1.0000 | MEDICATED_PATCH | TRANSDERMAL | Status: DC
  Administered 2021-09-24: 1.5 mg via TRANSDERMAL
  Filled 2021-09-24: qty 1

## 2021-09-24 MED ORDER — PROPOFOL 10 MG/ML IV BOLUS
INTRAVENOUS | Status: DC | PRN
  Administered 2021-09-24: 20 mg via INTRAVENOUS
  Administered 2021-09-24: 150 mg via INTRAVENOUS

## 2021-09-24 MED ORDER — OXYCODONE HCL 5 MG PO TABS
5.0000 mg | ORAL_TABLET | Freq: Once | ORAL | Status: AC | PRN
  Administered 2021-09-24: 5 mg via ORAL

## 2021-09-24 SURGICAL SUPPLY — 56 items
ALCOHOL 70% 16 OZ (MISCELLANEOUS) ×2 IMPLANT
ANCHOR FBRTK 2.6 SUTURETAP 1.3 (Anchor) ×4 IMPLANT
ANCHOR SUT 1.8 FBRTK KNTLS 2SU (Anchor) ×2 IMPLANT
ANCHOR SWIVELOCK BIO 4.75X19.1 (Anchor) ×4 IMPLANT
BAG COUNTER SPONGE SURGICOUNT (BAG) ×2 IMPLANT
BLADE EXCALIBUR 4.0X13 (MISCELLANEOUS) ×2 IMPLANT
BLADE SURG 11 STRL SS (BLADE) ×2 IMPLANT
COOLER ICEMAN CLASSIC (MISCELLANEOUS) ×2 IMPLANT
DRAPE IMP U-DRAPE 54X76 (DRAPES) ×2 IMPLANT
DRAPE INCISE IOBAN 66X45 STRL (DRAPES) ×2 IMPLANT
DRAPE STERI 35X30 U-POUCH (DRAPES) ×2 IMPLANT
DRAPE U-SHAPE 47X51 STRL (DRAPES) ×4 IMPLANT
DRSG AQUACEL AG ADV 3.5X 4 (GAUZE/BANDAGES/DRESSINGS) ×2 IMPLANT
DRSG TEGADERM 4X4.75 (GAUZE/BANDAGES/DRESSINGS) ×6 IMPLANT
DRSG XEROFORM 1X8 (GAUZE/BANDAGES/DRESSINGS) ×2 IMPLANT
DURAPREP 26ML APPLICATOR (WOUND CARE) ×2 IMPLANT
DW OUTFLOW CASSETTE/TUBE SET (MISCELLANEOUS) ×2 IMPLANT
ELECT REM PT RETURN 9FT ADLT (ELECTROSURGICAL) ×2
ELECTRODE REM PT RTRN 9FT ADLT (ELECTROSURGICAL) ×1 IMPLANT
GAUZE SPONGE 4X4 12PLY STRL LF (GAUZE/BANDAGES/DRESSINGS) ×2 IMPLANT
GAUZE XEROFORM 1X8 LF (GAUZE/BANDAGES/DRESSINGS) ×2 IMPLANT
GLOVE SRG 8 PF TXTR STRL LF DI (GLOVE) ×1 IMPLANT
GLOVE SURG ENC TEXT LTX SZ6.5 (GLOVE) ×2 IMPLANT
GLOVE SURG LTX SZ8 (GLOVE) ×2 IMPLANT
GLOVE SURG UNDER POLY LF SZ6.5 (GLOVE) ×2 IMPLANT
GLOVE SURG UNDER POLY LF SZ8 (GLOVE) ×1
GOWN STRL REUS W/ TWL LRG LVL3 (GOWN DISPOSABLE) ×3 IMPLANT
GOWN STRL REUS W/TWL LRG LVL3 (GOWN DISPOSABLE) ×3
KIT BASIN OR (CUSTOM PROCEDURE TRAY) ×2 IMPLANT
KIT STR SPEAR 1.8 FBRTK DISP (KITS) ×2 IMPLANT
KIT TURNOVER KIT B (KITS) ×2 IMPLANT
MANIFOLD NEPTUNE II (INSTRUMENTS) ×2 IMPLANT
NEEDLE HYPO 25X1 1.5 SAFETY (NEEDLE) ×2 IMPLANT
NEEDLE SCORPION MULTI FIRE (NEEDLE) ×2 IMPLANT
NEEDLE SPNL 18GX3.5 QUINCKE PK (NEEDLE) ×2 IMPLANT
NS IRRIG 1000ML POUR BTL (IV SOLUTION) ×2 IMPLANT
PACK SHOULDER (CUSTOM PROCEDURE TRAY) ×2 IMPLANT
PAD ARMBOARD 7.5X6 YLW CONV (MISCELLANEOUS) ×4 IMPLANT
PAD COLD SHLDR WRAP-ON (PAD) ×2 IMPLANT
PORT APPOLLO RF 90DEGREE MULTI (SURGICAL WAND) ×2 IMPLANT
RESTRAINT HEAD UNIVERSAL NS (MISCELLANEOUS) ×2 IMPLANT
SLING ARM FOAM STRAP LRG (SOFTGOODS) IMPLANT
SLING ARM IMMOBILIZER MED (SOFTGOODS) ×2 IMPLANT
SPONGE T-LAP 4X18 ~~LOC~~+RFID (SPONGE) ×2 IMPLANT
SUCTION FRAZIER HANDLE 10FR (MISCELLANEOUS)
SUCTION TUBE FRAZIER 10FR DISP (MISCELLANEOUS) IMPLANT
SUT ETHILON 3 0 PS 1 (SUTURE) ×4 IMPLANT
SUT VIC AB 1 CT1 27 (SUTURE) ×1
SUT VIC AB 1 CT1 27XBRD ANBCTR (SUTURE) ×1 IMPLANT
SUT VICRYL 0 UR6 27IN ABS (SUTURE) ×8 IMPLANT
SYS FBRTK BUTTON 2.6 (Anchor) ×2 IMPLANT
SYSTEM FBRTK BUTTON 2.6 (Anchor) ×1 IMPLANT
TOWEL GREEN STERILE (TOWEL DISPOSABLE) ×2 IMPLANT
TOWEL GREEN STERILE FF (TOWEL DISPOSABLE) ×2 IMPLANT
TUBING ARTHROSCOPY IRRIG 16FT (MISCELLANEOUS) ×2 IMPLANT
WATER STERILE IRR 1000ML POUR (IV SOLUTION) ×2 IMPLANT

## 2021-09-24 NOTE — Anesthesia Preprocedure Evaluation (Signed)
Anesthesia Evaluation  Patient identified by MRN, date of birth, ID band Patient awake    Reviewed: Allergy & Precautions, NPO status , Patient's Chart, lab work & pertinent test results  History of Anesthesia Complications (+) PONV and history of anesthetic complications  Airway Mallampati: II  TM Distance: >3 FB Neck ROM: Limited   Comment: Slightly decreased neck extension due to h/o cervical spine surgery Dental no notable dental hx.    Pulmonary neg pulmonary ROS,    Pulmonary exam normal breath sounds clear to auscultation       Cardiovascular Exercise Tolerance: Good negative cardio ROS Normal cardiovascular exam Rhythm:Regular Rate:Normal     Neuro/Psych negative neurological ROS  negative psych ROS   GI/Hepatic GERD  ,  Endo/Other    Renal/GU   negative genitourinary   Musculoskeletal  (+) Arthritis , Osteoarthritis,    Abdominal   Peds negative pediatric ROS (+)  Hematology negative hematology ROS (+)   Anesthesia Other Findings   Reproductive/Obstetrics                             Anesthesia Physical Anesthesia Plan  ASA: 2  Anesthesia Plan: General and Regional   Post-op Pain Management: GA combined w/ Regional for post-op pain   Induction: Intravenous  PONV Risk Score and Plan: Treatment may vary due to age or medical condition, Ondansetron, Dexamethasone, Midazolam and Scopolamine patch - Pre-op  Airway Management Planned: Oral ETT  Additional Equipment: None  Intra-op Plan:   Post-operative Plan: Extubation in OR  Informed Consent: I have reviewed the patients History and Physical, chart, labs and discussed the procedure including the risks, benefits and alternatives for the proposed anesthesia with the patient or authorized representative who has indicated his/her understanding and acceptance.     Dental advisory given  Plan Discussed with: CRNA,  Anesthesiologist and Surgeon  Anesthesia Plan Comments:        Anesthesia Quick Evaluation

## 2021-09-24 NOTE — Anesthesia Postprocedure Evaluation (Signed)
Anesthesia Post Note  Patient: [name]  Procedure(s) Performed: right shoulder arthroscopy, debridement,open rotator cuff tear repair (Right: Shoulder) BICEPS TENODESIS (Right: Shoulder)     Patient location during evaluation: PACU Anesthesia Type: Regional and General Level of consciousness: awake and alert Pain management: pain level controlled Vital Signs Assessment: post-procedure vital signs reviewed and stable Respiratory status: spontaneous breathing, nonlabored ventilation, respiratory function stable and patient connected to nasal cannula oxygen Cardiovascular status: blood pressure returned to baseline and stable Postop Assessment: no apparent nausea or vomiting Anesthetic complications: no   No notable events documented.  Last Vitals:  Vitals:   09/24/21 1526 09/24/21 1541  BP: (!) 102/56 97/60  Pulse: 65 62  Resp: 19 16  Temp:    SpO2: 100% 100%    Last Pain:  Vitals:   09/24/21 1512  TempSrc:   PainSc: 0-No pain                 Flynn Gwyn

## 2021-09-24 NOTE — Anesthesia Procedure Notes (Signed)
Procedure Name: Intubation Date/Time: 09/24/2021 12:30 PM Performed by: Maude Leriche, CRNA Pre-anesthesia Checklist: Patient identified, Emergency Drugs available, Suction available and Patient being monitored Patient Re-evaluated:Patient Re-evaluated prior to induction Oxygen Delivery Method: Circle system utilized Preoxygenation: Pre-oxygenation with 100% oxygen Induction Type: IV induction Ventilation: Mask ventilation without difficulty Laryngoscope Size: Glidescope and 3 Grade View: Grade I Tube type: Oral Number of attempts: 1 Airway Equipment and Method: Stylet and Oral airway Placement Confirmation: ETT inserted through vocal cords under direct vision, positive ETCO2 and breath sounds checked- equal and bilateral Secured at: 21 cm Tube secured with: Tape Dental Injury: Teeth and Oropharynx as per pre-operative assessment  Comments: Elective glidescope limited c-spine ROM

## 2021-09-24 NOTE — Anesthesia Procedure Notes (Signed)
Anesthesia Regional Block: Interscalene brachial plexus block   Pre-Anesthetic Checklist: , timeout performed,  Correct Patient, Correct Site, Correct Laterality,  Correct Procedure, Correct Position, site marked,  Risks and benefits discussed,  Surgical consent,  Pre-op evaluation,  At surgeon's request and post-op pain management  Laterality: Right  Prep: chloraprep       Needles:  Injection technique: Single-shot  Needle Type: Echogenic Stimulator Needle     Needle Length: 10cm  Needle Gauge: 20     Additional Needles:   Procedures:,,,, ultrasound used (permanent image in chart),,    Narrative:  Start time: 09/24/2021 10:30 AM End time: 09/24/2021 10:35 AM Injection made incrementally with aspirations every 5 mL.  Performed by: Personally  Anesthesiologist: Merlinda Frederick, MD  Additional Notes: Standard monitors applied. Skin prepped. Good needle visualization with ultrasound. Injection made in 5cc increments with no resistance to injection. Patient tolerated the procedure well.

## 2021-09-24 NOTE — H&P (Signed)
[name] is an 60 y.o. female.   Chief Complaint: Right shoulder pain HPI: Patient presents for evaluation of right shoulder pain.  Since she was last seen in clinic in August she is had an MRI scan which is reviewed.  She fell on her right shoulder in March as part of rehab from her ankle injury.  She states that the shoulder is not any better.  She reports pain but no weakness.  Denies any loss of passive range of motion.  She still is using a scooter at work.  MRI scan is reviewed.  It shows 14 mm full-thickness tear of the anterior aspect of the supraspinatus with about 16 mm of retraction.  Subscap intact.  No edema in the muscles  Past Medical History:  Diagnosis Date   Complication of anesthesia    PONV (postoperative nausea and vomiting)     Past Surgical History:  Procedure Laterality Date   AUGMENTATION MAMMAPLASTY Bilateral 2010   CERVICAL SPINE SURGERY  2003   C5-C7- double dissectomy   PAROTID GLAND TUMOR EXCISION     WISDOM TOOTH EXTRACTION Bilateral     Family History  Problem Relation Age of Onset   Lung cancer Mother    Thyroid disease Mother    Hypertension Mother    Hyperlipidemia Mother    CAD Mother        stent x 1   Osteoporosis Mother    Leukemia Father 48   Rheum arthritis Sister    Melanoma Sister    Osteopenia Sister    Autoimmune disease Daughter    Colon cancer Maternal Uncle    Heart disease Maternal Grandmother    Colon cancer Maternal Grandfather    Heart disease Maternal Grandfather    Stroke Maternal Grandfather    Heart disease Paternal Grandfather    Social History:  reports that she has never smoked. She has never used smokeless tobacco. She reports current alcohol use. She reports that she does not use drugs.  Allergies:  Allergies  Allergen Reactions   Cyclobenzaprine     Altered mental state    Pertussis Vaccines Swelling    Medications Prior to Admission  Medication Sig Dispense Refill   Biotin 5 MG CAPS Take 5 mg by  mouth daily.     CALCIUM PO Take 800 mg by mouth daily.     celecoxib (CELEBREX) 100 MG capsule Take 1 capsule (100 mg total) by mouth 2 (two) times daily. 60 capsule 0   Cholecalciferol 50 MCG (2000 UT) CAPS Take 2,000 Units by mouth daily.     esomeprazole (NEXIUM) 20 MG capsule Take 20 mg by mouth daily.     Estradiol 10 MCG TABS vaginal tablet Place 1 tablet (10 mcg total) vaginally twice a week 16 tablet 2   ibuprofen (ADVIL) 200 MG tablet Take 600 mg by mouth every 6 (six) hours as needed for moderate pain.     Melatonin 5 MG CAPS Take 5 mg by mouth at bedtime.     Probiotic Product (ALIGN PO) Take 1 tablet by mouth daily.     progesterone (PROMETRIUM) 200 MG capsule Take 1 capsule by mouth at bedtime.     testosterone cypionate (DEPOTESTOSTERONE CYPIONATE) 200 MG/ML injection Inject 18 mg into the muscle every 28 (twenty-eight) days.     traZODone (DESYREL) 50 MG tablet TAKE 1 TABLET BY MOUTH AT BEDTIME. 90 tablet 4   fluorouracil (EFUDEX) 5 % cream Apply to actinic keratosis on the R chest BID  x 7 days. Wait until after the holidays to start treatment. 15 g 0   gabapentin (NEURONTIN) 300 MG capsule Take 1 capsule (300 mg total) by mouth 3 (three) times daily. 30 capsule 0   influenza vac split quadrivalent PF (FLUARIX) 0.5 ML injection Inject into the muscle. 0.5 mL 0   methocarbamol (ROBAXIN) 500 MG tablet Take 1 tablet (500 mg total) by mouth every 8 (eight) hours as needed. 30 tablet 0   nitrofurantoin, macrocrystal-monohydrate, (MACROBID) 100 MG capsule Take 1 tablet (100 MG) by mouth only after intercourse. 90 capsule 4   oxyCODONE-acetaminophen (PERCOCET) 5-325 MG tablet Take 1 tablet by mouth every 4 (four) hours as needed for severe pain. 30 tablet 0   valACYclovir (VALTREX) 500 MG tablet Take 2 (two) tablets by mouth once daily for total of 5 days as needed for outbreaks. 60 tablet 4    No results found for this or any previous visit (from the past 48 hour(s)). No results  found.  Review of Systems  Musculoskeletal:  Positive for arthralgias.  All other systems reviewed and are negative.  Blood pressure (!) 95/54, pulse 66, temperature 98.1 F (36.7 C), temperature source Oral, resp. rate 14, height 5\' 3"  (1.6 m), weight 61.7 kg, SpO2 100 %. Physical Exam Vitals reviewed.  HENT:     Head: Normocephalic.     Mouth/Throat:     Mouth: Mucous membranes are moist.  Eyes:     Pupils: Pupils are equal, round, and reactive to light.  Cardiovascular:     Rate and Rhythm: Normal rate.     Pulses: Normal pulses.  Pulmonary:     Effort: Pulmonary effort is normal.  Abdominal:     General: Abdomen is flat.  Musculoskeletal:     Cervical back: Normal range of motion.  Skin:    General: Skin is warm.     Capillary Refill: Capillary refill takes less than 2 seconds.  Neurological:     General: No focal deficit present.     Mental Status: She is alert.  Psychiatric:        Mood and Affect: Mood normal.    Ortho exam demonstrates full active and passive range of motion of both shoulders.  Passive range of motion on the right is 50/100/180.  Similar findings on the left.  She does have a little bit of coarseness with internal ex rotation at 90 degrees of abduction on the right-hand side.  Negative apprehension relocation testing. Assessment/Plan Impressions right shoulder rotator cuff tear possible biceps tendinitis.  Plan is right shoulder arthroscopy with debridement biceps tendon release mini open rotator cuff tear and biceps tenodesis.  Risk and benefits are discussed with the patient.  They include but not limited to infection nerve vessel damage shoulder stiffness incomplete pain relief as well as retear of the rotator cuff.  The cuff tear is medium in size.  We should be able to achieve a strong repair which should allow early range of motion and rehabilitation.  Patient understands risk and benefits and wishes to proceed.  We will use a postop brace to  facilitate passive range of motion.  All questions answered.  Anderson Malta, MD 09/24/2021, 11:23 AM

## 2021-09-24 NOTE — Telephone Encounter (Signed)
Ok for black brace thx

## 2021-09-24 NOTE — Transfer of Care (Signed)
Immediate Anesthesia Transfer of Care Note  Patient: [name]  Procedure(s) Performed: right shoulder arthroscopy, debridement,open rotator cuff tear repair (Right: Shoulder) BICEPS TENODESIS (Right: Shoulder)  Patient Location: PACU  Anesthesia Type:GA combined with regional for post-op pain  Level of Consciousness: awake, alert , drowsy and patient cooperative  Airway & Oxygen Therapy: Patient Spontanous Breathing  Post-op Assessment: Report given to RN and Post -op Vital signs reviewed and stable  Post vital signs: Reviewed and stable  Last Vitals:  Vitals Value Taken Time  BP 107/64 09/24/21 1512  Temp    Pulse 81 09/24/21 1512  Resp 15 09/24/21 1512  SpO2 99 % 09/24/21 1512  Vitals shown include unvalidated device data.  Last Pain:  Vitals:   09/24/21 1050  TempSrc:   PainSc: 0-No pain         Complications: No notable events documented.

## 2021-09-25 ENCOUNTER — Telehealth: Payer: Self-pay | Admitting: Orthopedic Surgery

## 2021-09-25 ENCOUNTER — Encounter: Payer: Self-pay | Admitting: Orthopedic Surgery

## 2021-09-25 ENCOUNTER — Encounter (HOSPITAL_COMMUNITY): Payer: Self-pay | Admitting: Orthopedic Surgery

## 2021-09-25 NOTE — Telephone Encounter (Signed)
Pt called stating she just had surgery and hs some questions she would like to ask. Pt would like a CB.   [phone]

## 2021-09-25 NOTE — Telephone Encounter (Signed)
Pt called again regarding message below as well as a [employer]. She stated it was rude that she has yet to receive a call back since she called at 8:30 this am and then proceeded to tell me that she just had surgery she should have received a call back by now.

## 2021-09-25 NOTE — Addendum Note (Signed)
Addendum  created 09/25/21 1549 by Janeece Riggers, MD   Attestation recorded in [place], [place] filed

## 2021-09-25 NOTE — Telephone Encounter (Signed)
Answered questions to her satisfaction

## 2021-09-26 DIAGNOSIS — M75121 Complete rotator cuff tear or rupture of right shoulder, not specified as traumatic: Secondary | ICD-10-CM | POA: Diagnosis not present

## 2021-09-26 NOTE — Telephone Encounter (Signed)
Would not take trazadone with pain meds  Can you have brent or nathan take her ablack brace thx

## 2021-09-26 NOTE — Telephone Encounter (Signed)
Spoke with [employer] and they were going to give her a call

## 2021-09-27 ENCOUNTER — Telehealth: Payer: Self-pay | Admitting: Orthopedic Surgery

## 2021-09-27 NOTE — Telephone Encounter (Signed)
I called.

## 2021-09-27 NOTE — Telephone Encounter (Signed)
tyvm

## 2021-09-27 NOTE — Telephone Encounter (Signed)
Pt called ad has questions about a machine she suppose to use after surgery. She states it is giving her spasms. The machine is pro line according spring/brace. Please call pt at [phone].

## 2021-09-29 ENCOUNTER — Encounter: Payer: Self-pay | Admitting: Dermatology

## 2021-09-30 ENCOUNTER — Telehealth: Payer: Self-pay | Admitting: Orthopedic Surgery

## 2021-09-30 NOTE — Telephone Encounter (Signed)
Pt called and states her pro line according spring/brace is malfunctioning. Please call her back. She has not been able to use it since Saturday.  CB [phone]

## 2021-09-30 NOTE — Telephone Encounter (Signed)
I have [name] with [employer] asking him to advise.

## 2021-10-02 ENCOUNTER — Other Ambulatory Visit: Payer: Self-pay

## 2021-10-02 ENCOUNTER — Ambulatory Visit (INDEPENDENT_AMBULATORY_CARE_PROVIDER_SITE_OTHER): Payer: 59 | Admitting: Orthopedic Surgery

## 2021-10-02 ENCOUNTER — Encounter: Payer: Self-pay | Admitting: Orthopedic Surgery

## 2021-10-02 DIAGNOSIS — M75121 Complete rotator cuff tear or rupture of right shoulder, not specified as traumatic: Secondary | ICD-10-CM

## 2021-10-02 MED ORDER — TRAMADOL HCL 50 MG PO TABS
50.0000 mg | ORAL_TABLET | Freq: Four times a day (QID) | ORAL | 0 refills | Status: DC | PRN
  Filled 2021-10-02: qty 30, 8d supply, fill #0

## 2021-10-02 MED ORDER — GABAPENTIN 300 MG PO CAPS
300.0000 mg | ORAL_CAPSULE | Freq: Three times a day (TID) | ORAL | 0 refills | Status: DC
  Filled 2021-10-02: qty 30, 10d supply, fill #0

## 2021-10-02 MED ORDER — CELECOXIB 100 MG PO CAPS
100.0000 mg | ORAL_CAPSULE | Freq: Two times a day (BID) | ORAL | 0 refills | Status: DC
  Filled 2021-10-02: qty 60, 30d supply, fill #0

## 2021-10-02 NOTE — Progress Notes (Signed)
Post-Op Visit Note   Patient: [name]           Date of Birth: 05-15-1961           MRN: [id number] Visit Date: 10/02/2021 PCP: Venita Lick, NP   Assessment & Plan:  Chief Complaint:  Chief Complaint  Patient presents with   Right Shoulder - Routine Post Op     09/24/21 (8d)   right shoulder arthroscopy, debridement,open rotator cuff tear repair - Right   Biceps Tenodesis - Right      Visit Diagnoses:  1. Complete tear of right rotator cuff, unspecified whether traumatic     Plan: Patient is a 60 year old female who presents s/p right shoulder rotator cuff repair and biceps tenodesis on 10//22.  She states she is having difficulty with pain.  She is taking Neurontin, Celebrex, Skelaxin.  Only taking oxycodone as needed which is just for breakthrough pain maybe once or twice per day at most.  She received her CPM machine last Thursday and then it broke down and so she is only used it a few times since getting her new machine.  She is set to 45 degrees on the CPM machine.  She denies any fevers, chills, night sweats, drainage from the incisions.  She has had bruising and swelling down the arm but this has improved in the last several days.  She is not lifting the arm under its own power or lifting anything with the arm.  On exam she has really no external rotation, abduction to 60 degrees, forward flexion to 60 degrees.  No crepitus noted with passive motion of the shoulder.  She has severe pain with passive motion of the shoulder.  Incisions are healing well without any evidence of infection or dehiscence.  Sutures removed and replaced with Steri-Strips.  Actually nerve intact with deltoid firing.  She has intact motor function to the distal extremity and 2+ radial pulse of the operative extremity.  Plan is to continue with CPM machine and work on getting as much motion she can over the next 2 weeks with no lifting of the arm.  She will continue use of the sling.  Follow-up in 2  weeks for clinical recheck with Dr. [name] and initiation of physical therapy at that point.  Prescribed tramadol for use instead of oxycodone.  Follow-Up Instructions: No follow-ups on file.   Orders:  No orders of the defined types were placed in this encounter.  No orders of the defined types were placed in this encounter.   Imaging: No results found.  PMFS History: Patient Active Problem List   Diagnosis Date Noted   Healthcare maintenance 07/31/2021   Congenital pes cavus 02/25/2021   History of uterine fibroid 01/05/2021   Herpes 07/12/2020   GERD (gastroesophageal reflux disease) 05/23/2020   Vaginal atrophy 05/23/2020   Cervical spine arthritis 05/23/2020   Osteopenia 05/23/2020   Insomnia 05/20/2020   Postmenopausal hormone therapy 05/20/2020   Past Medical History:  Diagnosis Date   Complication of anesthesia    PONV (postoperative nausea and vomiting)     Family History  Problem Relation Age of Onset   Lung cancer Mother    Thyroid disease Mother    Hypertension Mother    Hyperlipidemia Mother    CAD Mother        stent x 1   Osteoporosis Mother    Leukemia Father 70   Rheum arthritis Sister    Melanoma Sister    Osteopenia  Sister    Autoimmune disease Daughter    Colon cancer Maternal Uncle    Heart disease Maternal Grandmother    Colon cancer Maternal Grandfather    Heart disease Maternal Grandfather    Stroke Maternal Grandfather    Heart disease Paternal Grandfather     Past Surgical History:  Procedure Laterality Date   AUGMENTATION MAMMAPLASTY Bilateral 2010   BICEPT TENODESIS Right 09/24/2021   Procedure: BICEPS TENODESIS;  Surgeon: [name], MD;  Location: [place];  Service: Orthopedics;  Laterality: Right;   CERVICAL SPINE SURGERY  2003   C5-C7- double dissectomy   PAROTID GLAND TUMOR EXCISION     SHOULDER ARTHROSCOPY Right 09/24/2021   Procedure: right shoulder arthroscopy, debridement,open rotator cuff tear repair;  Surgeon:  [name], MD;  Location: [place];  Service: Orthopedics;  Laterality: Right;   WISDOM TOOTH EXTRACTION Bilateral    Social History   Occupational History   Not on file  Tobacco Use   Smoking status: Never   Smokeless tobacco: Never  Vaping Use   Vaping Use: Never used  Substance and Sexual Activity   Alcohol use: Yes    Comment: ocassional   Drug use: Never   Sexual activity: Yes

## 2021-10-03 ENCOUNTER — Other Ambulatory Visit: Payer: Self-pay

## 2021-10-03 NOTE — Op Note (Signed)
[name], [name] MEDICAL RECORD NO: [id number] ACCOUNT NO: [id number] DATE OF BIRTH: 03/07/1961 FACILITY: [facility] LOCATION: [facility] PHYSICIAN: Yetta Barre. [name], MD  Operative Report   DATE OF PROCEDURE: 09/24/2021  PREOPERATIVE DIAGNOSES:  Right shoulder rotator cuff tear and biceps tendinitis.  POSTOPERATIVE DIAGNOSES:  Right shoulder rotator cuff tear and biceps tendinitis.  PROCEDURES PERFORMED:  Right shoulder arthroscopy with superior labral debridement, biceps tendon release, subacromial decompression, mini open rotator cuff tear repair and biceps tenodesis.  SURGEON:  Yetta Barre. [name], MD  ASSISTANT:  Annie Main, PA.  INDICATIONS:  The patient is a 60 year old patient with right shoulder pain of 7 months' duration following a fall.  MRI scan confirms biceps tendinitis and rotator cuff tear, full thickness involving the supraspinatus.  He presents now for operative  management after explanation of risks and benefits and failure of conservative treatment.  DESCRIPTION OF PROCEDURE:  The patient was brought to the operating room where general anesthetic was induced.  Preoperative antibiotics were administered and timeout was called.  Right shoulder examined under anesthesia and she was found to have some  restriction of passive forward flexion and external rotation.  This was consistent with early adhesive capsulitis.  After calling time-out manipulation was performed into full forward flexion and external rotation to about 70 degrees.  The patient was  placed in the beach chair position with the head in neutral position.  Right shoulder pre-scrubbed with alcohol, Betadine, allowed to air dry, prepped with DuraPrep solution and draped in a sterile manner.  Ioban used to seal the operative field.   Posterior portal was created.  Anterior portal created under direct visualization.  Diagnostic arthroscopy was performed.  The patient did have synovitis within the rotator interval, which  was debrided.  This was consistent with early frozen shoulder.   Biceps tendinitis was also present.  This was released.  Biceps tendon was released using the Arthrocare wand after creating an anterior portal.  Glenohumeral articular surfaces were intact.  U-shaped tear of the supraspinatus was present, which measured  about 2 x 2 cm.  After arthroscopy and debridement, biceps tendon release instruments were removed.  Portals were closed using 3-0 nylon.  Ioban then used to cover the entire operative field.  Small incision made off the anterolateral margin of the  acromion.  Skin and subcutaneous tissue were sharply divided.  Raphae between the anterior middle deltoid was developed and stay suture placed 4 cm from the anterolateral margin of the acromion.  At this time, bursectomy performed.  Subacromial  decompression with the rasp performed.  The tear was identified and it was more of a chronic-appearing tear.  The edges of the rotator cuff were debrided changing it more to an A-shaped tear.  Biceps tendon was then tenodesed under appropriate tension  into the bicipital groove using 2 Arthrex knotless SutureTak anchors and oversewing with 3-0 Vicryl sutures.  This gave secure repair of the biceps tendon within the groove at the appropriate tension level.  Next, the rotator cuff tear was identified.   The edges of the rotator cuff tear were mobilized.  Using three 2-0 FiberWire sutures, A-shaped margin convergence was performed and the limbs were maintained.  Two suture anchors were then placed at the articular surface greater tuberosity margin.   These 8 suture tapes were then brought through the rotator cuff tendon and then combined with the tails of the convergent sutures along with the Vicryl sutures, which were present to also assist in mobilization of the  tendon; 8 suture limbs per SwiveLock  were then placed into the metaphyseal region of the bone with good fixation achieved.  However, the bone  quality was marginal, which required minimal puncture with the punch.  This strategy did give very secure fixation, but bone quality was diminished  nonetheless.  Arm was taken through range of motion and found to have no impingement and excellent stability of the rotator cuff tear repair.  Thorough irrigation was performed.  Deltoid split was closed using #1 Vicryl suture followed by interrupted  inverted 0 Vicryl suture, 2-0 Vicryl suture, and 3-0 Monocryl, Steri-Strips, and Aquacel dressing applied.  The patient tolerated the procedure well without immediate complication.  We will start her on CPM machine immediately due to her early adhesive  capsulitis.  Luke's assistance was required at all times for retraction, opening, closing, mobilization of tissues.  His assistance was a medical necessity.     [email or web address] D: 10/03/2021 7:24:03 am T: 10/03/2021 7:55:00 am  JOB: [id number]/ [id number]

## 2021-10-03 NOTE — Brief Op Note (Signed)
   09/24/2021  7:18 AM  PATIENT:  [name]  60 y.o. female  PRE-OPERATIVE DIAGNOSIS:  right shoulder rotator cuff tear, biceps tendonitis  POST-OPERATIVE DIAGNOSIS:  right shoulder rotator cuff tear, biceps tendonitis  PROCEDURE:  Procedure(s): right shoulder arthroscopy, debridement,open rotator cuff tear repair BICEPS TENODESIS  SURGEON:  Surgeon(s): [name], Tonna Corner, MD  ASSISTANT: magnant pa  ANESTHESIA:   general  EBL: 15 ml    No intake/output data recorded.  BLOOD ADMINISTERED: none  DRAINS: none   LOCAL MEDICATIONS USED:  none  SPECIMEN:  No Specimen  COUNTS:  YES  TOURNIQUET:  * No tourniquets in log *  DICTATION: .Other Dictation: Dictation Number [id number]  PLAN OF CARE: Discharge to home after PACU  PATIENT DISPOSITION:  PACU - hemodynamically stable

## 2021-10-04 ENCOUNTER — Other Ambulatory Visit: Payer: Self-pay

## 2021-10-05 DIAGNOSIS — S43431A Superior glenoid labrum lesion of right shoulder, initial encounter: Secondary | ICD-10-CM

## 2021-10-05 DIAGNOSIS — M75121 Complete rotator cuff tear or rupture of right shoulder, not specified as traumatic: Secondary | ICD-10-CM

## 2021-10-05 DIAGNOSIS — M7501 Adhesive capsulitis of right shoulder: Secondary | ICD-10-CM

## 2021-10-07 ENCOUNTER — Other Ambulatory Visit: Payer: Self-pay

## 2021-10-14 ENCOUNTER — Other Ambulatory Visit: Payer: Self-pay

## 2021-10-14 ENCOUNTER — Other Ambulatory Visit: Payer: Self-pay | Admitting: Surgical

## 2021-10-14 DIAGNOSIS — M75121 Complete rotator cuff tear or rupture of right shoulder, not specified as traumatic: Secondary | ICD-10-CM

## 2021-10-15 ENCOUNTER — Other Ambulatory Visit: Payer: Self-pay

## 2021-10-15 ENCOUNTER — Telehealth: Payer: Self-pay | Admitting: Surgical

## 2021-10-15 ENCOUNTER — Encounter: Payer: Self-pay | Admitting: Orthopedic Surgery

## 2021-10-15 ENCOUNTER — Other Ambulatory Visit: Payer: Self-pay | Admitting: Surgical

## 2021-10-15 ENCOUNTER — Other Ambulatory Visit: Payer: Self-pay | Admitting: Orthopedic Surgery

## 2021-10-15 MED ORDER — METAXALONE 800 MG PO TABS
800.0000 mg | ORAL_TABLET | Freq: Three times a day (TID) | ORAL | 0 refills | Status: DC
  Filled 2021-10-15 – 2021-10-21 (×3): qty 90, 30d supply, fill #0

## 2021-10-15 MED FILL — Gabapentin Cap 300 MG: ORAL | 20 days supply | Qty: 60 | Fill #0 | Status: AC

## 2021-10-15 NOTE — Telephone Encounter (Signed)
Received call from [facility] with [place] she advised patient is suppose to take another Gabapentin tab at 3:00pm and is completely out of her medication. Pam said she is trying to help patient get her medication because she's a post op patient.  The number to contact the pharmacy is [phone]

## 2021-10-15 NOTE — Telephone Encounter (Signed)
Sent in refill and sent message to pt

## 2021-10-16 ENCOUNTER — Other Ambulatory Visit: Payer: Self-pay

## 2021-10-16 ENCOUNTER — Ambulatory Visit (INDEPENDENT_AMBULATORY_CARE_PROVIDER_SITE_OTHER): Payer: 59 | Admitting: Orthopedic Surgery

## 2021-10-16 DIAGNOSIS — M75121 Complete rotator cuff tear or rupture of right shoulder, not specified as traumatic: Secondary | ICD-10-CM

## 2021-10-16 MED ORDER — TRAMADOL HCL 50 MG PO TABS
50.0000 mg | ORAL_TABLET | Freq: Three times a day (TID) | ORAL | 0 refills | Status: DC | PRN
  Filled 2021-10-16: qty 40, 14d supply, fill #0

## 2021-10-17 ENCOUNTER — Other Ambulatory Visit: Payer: Self-pay

## 2021-10-18 ENCOUNTER — Encounter: Payer: Self-pay | Admitting: Physical Therapy

## 2021-10-18 ENCOUNTER — Other Ambulatory Visit: Payer: Self-pay

## 2021-10-18 ENCOUNTER — Ambulatory Visit: Payer: 59 | Attending: Nurse Practitioner | Admitting: Physical Therapy

## 2021-10-18 ENCOUNTER — Other Ambulatory Visit: Payer: Self-pay | Admitting: Nurse Practitioner

## 2021-10-18 ENCOUNTER — Encounter: Payer: Self-pay | Admitting: Orthopedic Surgery

## 2021-10-18 DIAGNOSIS — M75121 Complete rotator cuff tear or rupture of right shoulder, not specified as traumatic: Secondary | ICD-10-CM | POA: Insufficient documentation

## 2021-10-18 DIAGNOSIS — S46011D Strain of muscle(s) and tendon(s) of the rotator cuff of right shoulder, subsequent encounter: Secondary | ICD-10-CM | POA: Insufficient documentation

## 2021-10-18 DIAGNOSIS — M25511 Pain in right shoulder: Secondary | ICD-10-CM | POA: Insufficient documentation

## 2021-10-18 DIAGNOSIS — M25611 Stiffness of right shoulder, not elsewhere classified: Secondary | ICD-10-CM | POA: Diagnosis not present

## 2021-10-18 MED ORDER — NITROFURANTOIN MONOHYD MACRO 100 MG PO CAPS
ORAL_CAPSULE | ORAL | 4 refills | Status: DC
  Filled 2021-10-18: qty 90, 90d supply, fill #0

## 2021-10-18 NOTE — Therapy (Signed)
[place] MAIN [facility] SERVICES [address] [place], [place], [address] Phone: [phone]   Fax:  [phone]  Physical Therapy Evaluation  Patient Details  Name: [name] MRN: [id number] Date of Birth: 01-25-1961 No data recorded  Encounter Date: 10/18/2021   PT End of Session - 10/18/21 1202     Visit Number 1    Number of Visits 13    Date for PT Re-Evaluation 11/15/21    PT Start Time 1003    PT Stop Time 1100    PT Time Calculation (min) 57 min    Activity Tolerance Patient limited by pain    Behavior During Therapy [facility] for tasks assessed/performed             Past Medical History:  Diagnosis Date   Complication of anesthesia    PONV (postoperative nausea and vomiting)     Past Surgical History:  Procedure Laterality Date   AUGMENTATION MAMMAPLASTY Bilateral 2010   BICEPT TENODESIS Right 09/24/2021   Procedure: BICEPS TENODESIS;  Surgeon: [name], MD;  Location: [place];  Service: Orthopedics;  Laterality: Right;   CERVICAL SPINE SURGERY  2003   C5-C7- double dissectomy   PAROTID GLAND TUMOR EXCISION     SHOULDER ARTHROSCOPY Right 09/24/2021   Procedure: right shoulder arthroscopy, debridement,open rotator cuff tear repair;  Surgeon: [name], MD;  Location: [place];  Service: Orthopedics;  Laterality: Right;   WISDOM TOOTH EXTRACTION Bilateral     There were no vitals filed for this visit.    Subjective Assessment - 10/18/21 1004     Subjective s/p right shoulder arthroscopy with superior labral debridement, biceps tendon release, subacromial decompression, mini open rotator cuff tear repair and biceps tenodesis - surgery preformed on 09/24/21. MD protocol: "no strengthening for 6 weeks she had early adhesive capsulitis so she really needs to get moving no restrictions except pain repair is solid"    Pertinent History Pt is a 60 y/o female s/p right shoulder arthroscopy with superior labral  debridement, biceps tendon release, subacromial decompression, mini open rotator cuff tear repair and biceps tenodesis - surgery preformed on 09/24/21. She had a follow-up appt with Dr. [name] on 10/26 - sling was removed this date. MD encourages wearing sling at night, otherwise encourages leaving sling off during the day. Shoulder injury occured in March 2022 - she was using a scooter due to an ankle injury. She fell off of the scooter and onto her right shoulder. 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 Of note, pt also reports C5-C7 anterior fusion in 2003.    Limitations Writing;House hold activities;Lifting;Other (comment);Walking;Standing   driving   How long can you sit comfortably? WFL    How long can you stand comfortably? WFL w/ RUE supported    How long can you walk comfortably? 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 Yes    Pain Score 4     Pain Location Shoulder    Pain Orientation Right    Pain Descriptors / Indicators Aching;Grimacing;Guarding;Sharp;Tender;Tightness    Pain Type Acute pain    Pain Onset 1 to 4 weeks ago    Pain Frequency Constant    Aggravating Factors  no sling/support, ER, writing, lying supine    Pain Relieving Factors  pain meds (Celebrex, gabapentin, Tramadol prn), heat (better than ice), pillow/sling to decrease downward tension on [place] joint    Multiple Pain Sites No           SUBJECTIVE Chief complaint: Pt is a 60y/o female s/p right shoulder arthroscopy with superior labral debridement, biceps tendon release, subacromial decompression, mini open rotator cuff tear repair and biceps tenodesis - surgery preformed on 09/24/21. She had a follow-up appt with Dr. [name] on 10/26 - sling was removed this date. MD encourages wearing sling at night, otherwise encourages leaving sling off during the day. Of  note, pt also reports C5-C7 anterior fusion in 2003.  Onset: [place] in March 2022 - she was using a scooter due to an ankle injury. She fell off of the scooter and onto her right shoulder. 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  Shoulder trauma: Yes - fell off knee scooter Pain quality: sharp, tight, aching, tender Pain: 4/10 Present, 4/10 Best, 10/10 Worst: Aggravating factors: no sling/support, ER, writing, lying supine. Easing factors: pain meds (Celebrex, gabapentin, Tramadol prn), heat (better than ice), pillow/sling to decrease downward tension on GH joint 24 hour pain behavior: pain in early morning, best mid-morning, worst in the evening. Radiating pain: No - occasional deep ache into forearm/wrist  Numbness/Tingling: No Prior history of shoulder injury or pain: No Prior history of therapy for shoulder: No Follow-up appointment with MD: Yes - 11/06/21 w/ Dr. [name] Dominant hand: Right  OBJECTIVE  MUSCULOSKELETAL: Tremor: Normal Bulk: Normal Tone: Normal   Elbow Screen Elbow AROM: Within Normal Limits PROM: pain with elbow flexion OP  Palpation Pain/tenderness with light palpation to anterior, posterior, lateral, and superior shoulder.  Strength NT - MD protocol PROM and AAROM only   PROM (LUE screened and WFL for all planes of movement) (Performed in sitting - pt unable to tolerate prolonged supine) R/L  41*/180 Shoulder flexion 60*/180 Shoulder abduction 0*/90 Shoulder external rotation - unable to obtain testing position, shoulder in neutral  WFL/70 Shoulder internal rotation - unable to obtain testing position, shoulder in neutral  23*/60 Shoulder extension *Indicates pain, overpressure performed unless otherwise indicated.   NEUROLOGICAL:  Mental Status Patient is oriented to person, place and time.  Recent memory is intact.  Remote memory is intact.  Attention span and concentration are intact.   Expressive speech is intact.  Patient's fund of knowledge is within normal limits for educational level.  Sensation Grossly intact to light touch bilateral UE as determined by testing dermatomes C2-T2 Proprioception and hot/cold testing deferred on this date   Outcome Measures Quick DASH: 80%  FOTO: 20%   INTERVENTION: MHP applied to R shoulder, pt in supine with towel roll under RUE to rest in neutral. Pt reporting radiating pain into mid-humeral shaft. Pt transferring to sitting EOM. Heat for 6 minutes (unbilled)  Gentle PROM - R GH flexion, scaption, abduction, ER. 5-10 reps each, to pt pain tolerance, with rest break between each.   PT demo 3 HEP exercises followed by pt demo ability to perform 2-3 reps each. Reps limited due to pain.      ASSESSMENT Pt is a pleasant 60 year-old female referred for evaluation and treatment of right shoulder pain and ROM. PT examination reveals deficits in global ROM of R GH joint with significant pain in all planes throughout assessment. Assessment of strength deferred for the next 6 weeks 2/2 protocol. Pt will benefit from PT services to  address deficits in mobility and pain in order to return to full function at home and work with less shoulder pain.   PLAN HEP: RUE AAROM - flexion, abduction, ER (using PVC) Next Visit: PROM, AAROM, joint mobs - begin session with heat    Objective measurements completed on examination: See above findings.    Patient will benefit from skilled therapeutic intervention in order to improve the following deficits and impairments:  Decreased activity tolerance, Decreased range of motion, Hypomobility, Impaired perceived functional ability, Impaired UE functional use, Improper body mechanics, Pain, Decreased mobility  Visit Diagnosis: Traumatic complete tear of right rotator cuff, subsequent encounter  Acute pain of right shoulder  Stiffness of right shoulder, not elsewhere classified     Problem  List Patient Active Problem List   Diagnosis Date Noted   Adhesive capsulitis of right shoulder    Complete tear of right rotator cuff    Degenerative superior labral anterior-to-posterior (SLAP) tear of right shoulder    Healthcare maintenance 07/31/2021   Congenital pes cavus 02/25/2021   History of uterine fibroid 01/05/2021   Herpes 07/12/2020   GERD (gastroesophageal reflux disease) 05/23/2020   Vaginal atrophy 05/23/2020   Cervical spine arthritis 05/23/2020   Osteopenia 05/23/2020   Insomnia 05/20/2020   Postmenopausal hormone therapy 05/20/2020     Patrina Levering PT, DPT  [facility] MAIN [facility] SERVICES [address] [place], [place], [address] Phone: [phone]   Fax:  [phone]  Name: [name] MRN: [id number] Date of Birth: 12-May-1961

## 2021-10-18 NOTE — Progress Notes (Signed)
Post-Op Visit Note   Patient: [name]           Date of Birth: [birth year]           MRN: [id number] Visit Date: 10/16/2021 PCP: Venita Lick, NP   Assessment & Plan:  Chief Complaint:  Chief Complaint  Patient presents with   Post-op Follow-up     09/24/21 (3w 1d)  right shoulder arthroscopy, debridement,open rotator cuff tear repair - Right   Biceps Tenodesis    Visit Diagnoses:  1. Complete tear of right rotator cuff, unspecified whether traumatic     Plan: Patient presents now 3 weeks out right shoulder arthroscopy debridement rotator cuff tear repair.  She also had early adhesive capsulitis.  She is 3 weeks out from surgery.  Taking Neurontin nonsteroidal tramadol and muscle relaxer.  On exam she has passive range of motion of 10/60/90.  Plan at this time is to discontinue sling.  Refill tramadol.  Start physical therapy 3-4 times a week for the next 3 weeks to work on active assisted range of motion and passive range of motion.  Would like for her to start that sooner rather than later.  The rotator cuff strength and repair feels good but I think she is a little bit on the stiff side.  Okay for her to use the arm for activities in front of her body.  We do need to start making progress with motion to avoid manipulation in the future.  Follow-up in 3 weeks.  Follow-Up Instructions: No follow-ups on file.   Orders:  Orders Placed This Encounter  Procedures   Ambulatory referral to Physical Therapy   Meds ordered this encounter  Medications   traMADol (ULTRAM) 50 MG tablet    Sig: Take 1 tablet (50 mg total) by mouth every 8 (eight) hours as needed.    Dispense:  40 tablet    Refill:  0    Imaging: No results found.  PMFS History: Patient Active Problem List   Diagnosis Date Noted   Adhesive capsulitis of right shoulder    Complete tear of right rotator cuff    Degenerative superior labral anterior-to-posterior (SLAP) tear of right shoulder    Healthcare  maintenance 07/31/2021   Congenital pes cavus 02/25/2021   History of uterine fibroid 01/05/2021   Herpes 07/12/2020   GERD (gastroesophageal reflux disease) 05/23/2020   Vaginal atrophy 05/23/2020   Cervical spine arthritis 05/23/2020   Osteopenia 05/23/2020   Insomnia 05/20/2020   Postmenopausal hormone therapy 05/20/2020   Past Medical History:  Diagnosis Date   Complication of anesthesia    PONV (postoperative nausea and vomiting)     Family History  Problem Relation Age of Onset   Lung cancer Mother    Thyroid disease Mother    Hypertension Mother    Hyperlipidemia Mother    CAD Mother        stent x 1   Osteoporosis Mother    Leukemia Father 83   Rheum arthritis Sister    Melanoma Sister    Osteopenia Sister    Autoimmune disease Daughter    Colon cancer Maternal Uncle    Heart disease Maternal Grandmother    Colon cancer Maternal Grandfather    Heart disease Maternal Grandfather    Stroke Maternal Grandfather    Heart disease Paternal Grandfather     Past Surgical History:  Procedure Laterality Date   AUGMENTATION MAMMAPLASTY Bilateral 2010   BICEPT TENODESIS Right 09/24/2021   Procedure:  BICEPS TENODESIS;  Surgeon: [name], MD;  Location: [place];  Service: Orthopedics;  Laterality: Right;   CERVICAL SPINE SURGERY  2003   C5-C7- double dissectomy   PAROTID GLAND TUMOR EXCISION     SHOULDER ARTHROSCOPY Right 09/24/2021   Procedure: right shoulder arthroscopy, debridement,open rotator cuff tear repair;  Surgeon: [name], MD;  Location: [place];  Service: Orthopedics;  Laterality: Right;   WISDOM TOOTH EXTRACTION Bilateral    Social History   Occupational History   Not on file  Tobacco Use   Smoking status: Never   Smokeless tobacco: Never  Vaping Use   Vaping Use: Never used  Substance and Sexual Activity   Alcohol use: Yes    Comment: ocassional   Drug use: Never   Sexual activity: Yes

## 2021-10-21 ENCOUNTER — Other Ambulatory Visit: Payer: Self-pay

## 2021-10-21 ENCOUNTER — Ambulatory Visit: Payer: 59

## 2021-10-21 DIAGNOSIS — M25511 Pain in right shoulder: Secondary | ICD-10-CM

## 2021-10-21 DIAGNOSIS — M25611 Stiffness of right shoulder, not elsewhere classified: Secondary | ICD-10-CM | POA: Diagnosis not present

## 2021-10-21 DIAGNOSIS — S46011D Strain of muscle(s) and tendon(s) of the rotator cuff of right shoulder, subsequent encounter: Secondary | ICD-10-CM | POA: Diagnosis not present

## 2021-10-21 DIAGNOSIS — M75121 Complete rotator cuff tear or rupture of right shoulder, not specified as traumatic: Secondary | ICD-10-CM | POA: Diagnosis not present

## 2021-10-21 MED ORDER — PROGESTERONE 200 MG PO CAPS
ORAL_CAPSULE | ORAL | 3 refills | Status: DC
  Filled 2021-10-21: qty 90, 90d supply, fill #0
  Filled 2022-01-14: qty 90, 90d supply, fill #1

## 2021-10-21 MED ORDER — METHOCARBAMOL 500 MG PO TABS
500.0000 mg | ORAL_TABLET | Freq: Three times a day (TID) | ORAL | 0 refills | Status: DC | PRN
  Filled 2021-10-21: qty 30, 10d supply, fill #0

## 2021-10-21 NOTE — Therapy (Signed)
[place] MAIN [facility] SERVICES [address] [place], [place], [address] Phone: [phone]   Fax:  [phone]  Physical Therapy Treatment  Patient Details  Name: [name] MRN: [id number] Date of Birth: [birth year] No data recorded  Encounter Date: 10/21/2021   PT End of Session - 10/21/21 1225     Visit Number 2    Number of Visits 13    Date for PT Re-Evaluation 11/15/21    PT Start Time 8416    PT Stop Time 1100    PT Time Calculation (min) 45 min    Activity Tolerance Patient limited by pain    Behavior During Therapy [facility] for tasks assessed/performed             Past Medical History:  Diagnosis Date   Complication of anesthesia    PONV (postoperative nausea and vomiting)     Past Surgical History:  Procedure Laterality Date   AUGMENTATION MAMMAPLASTY Bilateral 2010   BICEPT TENODESIS Right 09/24/2021   Procedure: BICEPS TENODESIS;  Surgeon: [name], MD;  Location: [place];  Service: Orthopedics;  Laterality: Right;   CERVICAL SPINE SURGERY  2003   C5-C7- double dissectomy   PAROTID GLAND TUMOR EXCISION     SHOULDER ARTHROSCOPY Right 09/24/2021   Procedure: right shoulder arthroscopy, debridement,open rotator cuff tear repair;  Surgeon: [name], MD;  Location: [place];  Service: Orthopedics;  Laterality: Right;   WISDOM TOOTH EXTRACTION Bilateral     There were no vitals filed for this visit.   Subjective Assessment - 10/21/21 1019     Subjective Patient reports she took her pain medicine, did her HEP with a broom handle.    Pertinent History Pt is a 60 y/o female s/p right shoulder arthroscopy with superior labral debridement, biceps tendon release, subacromial decompression, mini open rotator cuff tear repair and biceps tenodesis - surgery preformed on 09/24/21. She had a follow-up appt with Dr. [name] on 10/26 - sling was removed this date. MD encourages wearing sling at night, otherwise encourages  leaving sling off during the day. Shoulder injury occured in March 2022 - she was using a scooter due to an ankle injury. She fell off of the scooter and onto her right shoulder. 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 Of note, pt also reports C5-C7 anterior fusion in 2003.    Limitations Writing;House hold activities;Lifting;Other (comment);Walking;Standing   driving   How long can you sit comfortably? WFL    How long can you stand comfortably? WFL w/ RUE supported    How long can you walk comfortably? 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 Yes    Pain Score 3     Pain Location Shoulder    Pain Orientation Right    Pain Descriptors / Indicators Aching    Pain Type Acute pain    Pain Onset 1 to 4 weeks ago                   Manual:  In modified supine position at 45 degrees with towel under R shoulder and heat pad: RUE:  -PROM flexion 10x 20 second holds -PROM abduction 10x 20 second holds -PROM ER 10x 20 second holds  -PROM bicep extension and flexion 15x 5 second  holds  -distraction 10x 20 second holds -grade I gentle AP, PA mobs for pain reduction 3 minutes -education and performance of scar tissue massage (added to HEP) x 4 minutes  -intermittent pertubation's for pain reduction throughout session   TherEx UE ranger: -flexion/forward reach and extension 10x 10 second holds -abduction cross body reach 10x 10 second holds  End of session ice applied in seated position with pillow under arm after session (no charge)  Pt educated throughout session about proper posture and technique with exercises. Improved exercise technique, movement at target joints, use of target muscles after min to mod verbal, visual, tactile cues.   Patient tolerated progressive ROM increase in passive range of motion with modified  supine position (table at 45 degree angle). Patient has intermittent pain and is cued for purse lipped breathing and given a squishy ball to squeeze. Scar tissue massage implemented and tolerated well. PROM and AAROM performed only due to protocol restricting AROM for 6 weeks. Pt will benefit from PT services to address deficits in mobility and pain in order to return to full function at home and work with less shoulder pain.                     PT Education - 10/21/21 1224     Education Details scar tissue massage, AAROM, PROM    Person(s) Educated Patient    Methods Explanation;Demonstration;Tactile cues;Verbal cues    Comprehension Verbalized understanding;Returned demonstration;Verbal cues required;Tactile cues required              PT Short Term Goals - 10/18/21 1218       PT SHORT TERM GOAL #1   Title Pt will be independent with HEP in order to improve strength and decrease pain in order to improve pain-free function at home and work.    Time 2    Period Weeks    Status New    Target Date 11/01/21               PT Long Term Goals - 10/18/21 1218       PT LONG TERM GOAL #1   Title Patient will improve right shoulder PROM to > 140 degrees of flexion, scaption, and abduction for improved ability to perform overhead activities.    Baseline 10/18/21: flexion 41*, abd 60*    Time 4    Period Weeks    Status New    Target Date 11/15/21      PT LONG TERM GOAL #2   Title Patient will improve right shoulder external rotation in order to perform hygiene/ADL's such as hair care pain-free.    Baseline 10/18/21: ER 0*    Time 4    Period Weeks    Status New    Target Date 11/15/21      PT LONG TERM GOAL #3   Title Pt will decrease quick DASH score by at least 8% in order to demonstrate clinically significant reduction in disability.    Baseline 10/18/21: 80%    Time 4    Period Weeks    Status New    Target Date 11/15/21      PT LONG TERM GOAL #4    Title Pt will decrease worst pain as reported on NPRS by at least 3 points in order to demonstrate clinically significant reduction in pain    Baseline 10/18/21: 10/10 pain    Time 4    Period Weeks    Status New  Target Date 11/15/21      PT LONG TERM GOAL #5   Title Patient will increase FOTO score to equal to or greater than 55 to demonstrate statistically significant improvement in mobility and quality of life.    Baseline 10/18/21: 20%    Time 4    Period Weeks    Status New    Target Date 11/15/21                   Plan - 10/21/21 1226     Clinical Impression Statement Patient tolerated progressive ROM increase in passive range of motion with modified supine position (table at 45 degree angle). Patient has intermittent pain and is cued for purse lipped breathing and given a squishy ball to squeeze. Scar tissue massage implemented and tolerated well. PROM and AAROM performed only due to protocol restricting AROM for 6 weeks. Pt will benefit from PT services to address deficits in mobility and pain in order to return to full function at home and work with less shoulder pain.    Personal Factors and Comorbidities Age;Time since onset of injury/illness/exacerbation    Examination-Activity Limitations Bathing;Reach Overhead;Dressing;Hygiene/Grooming;Toileting;Lift;Sleep;Carry    Examination-Participation Restrictions Driving;Cleaning;Meal Prep;Community Activity;Laundry;Occupation;Shop    Stability/Clinical Decision Making Stable/Uncomplicated    Rehab Potential Good    PT Frequency 3x / week    PT Duration 4 weeks    PT Treatment/Interventions ADLs/Self Care Home Management;Aquatic Therapy;Biofeedback;Electrical Stimulation;Cryotherapy;Iontophoresis 4mg /ml Dexamethasone;Moist Heat;Traction;Ultrasound;DME Instruction;Functional mobility training;Therapeutic activities;Therapeutic exercise;Neuromuscular re-education;Patient/family education;Manual techniques;Scar  mobilization;Passive range of motion;Dry needling;Other (comment);Joint Manipulations    PT Next Visit Plan PROM, AAROM, joint mobs - begin session with heat    PT Home Exercise Plan RUE AAROM - flexion, abduction, ER (using PVC) - no printout    Consulted and Agree with Plan of Care Patient             Patient will benefit from skilled therapeutic intervention in order to improve the following deficits and impairments:  Decreased activity tolerance, Decreased range of motion, Hypomobility, Impaired perceived functional ability, Impaired UE functional use, Improper body mechanics, Pain, Decreased mobility  Visit Diagnosis: Traumatic complete tear of right rotator cuff, subsequent encounter  Acute pain of right shoulder  Stiffness of right shoulder, not elsewhere classified     Problem List Patient Active Problem List   Diagnosis Date Noted   Adhesive capsulitis of right shoulder    Complete tear of right rotator cuff    Degenerative superior labral anterior-to-posterior (SLAP) tear of right shoulder    Healthcare maintenance 07/31/2021   Congenital pes cavus 02/25/2021   History of uterine fibroid 01/05/2021   Herpes 07/12/2020   GERD (gastroesophageal reflux disease) 05/23/2020   Vaginal atrophy 05/23/2020   Cervical spine arthritis 05/23/2020   Osteopenia 05/23/2020   Insomnia 05/20/2020   Postmenopausal hormone therapy 05/20/2020    Janna Arch, PT, DPT  10/21/2021, 12:28 PM  [place] MAIN [facility] SERVICES [address] [place], [place], [address] Phone: [phone]   Fax:  [phone]  Name: Sharese Manrique MRN: [id number] Date of Birth: 04/06/1961

## 2021-10-23 ENCOUNTER — Encounter: Payer: Self-pay | Admitting: Physical Therapy

## 2021-10-23 ENCOUNTER — Ambulatory Visit: Payer: 59 | Attending: Orthopedic Surgery | Admitting: Physical Therapy

## 2021-10-23 ENCOUNTER — Other Ambulatory Visit: Payer: Self-pay

## 2021-10-23 DIAGNOSIS — M6281 Muscle weakness (generalized): Secondary | ICD-10-CM | POA: Insufficient documentation

## 2021-10-23 DIAGNOSIS — M25611 Stiffness of right shoulder, not elsewhere classified: Secondary | ICD-10-CM | POA: Diagnosis not present

## 2021-10-23 DIAGNOSIS — M25511 Pain in right shoulder: Secondary | ICD-10-CM | POA: Diagnosis not present

## 2021-10-23 DIAGNOSIS — S46011D Strain of muscle(s) and tendon(s) of the rotator cuff of right shoulder, subsequent encounter: Secondary | ICD-10-CM

## 2021-10-23 NOTE — Therapy (Signed)
[place] MAIN [facility] SERVICES [address] [place], [place], [address] Phone: [phone]   Fax:  [phone]  Physical Therapy Treatment  Patient Details  Name: [name] MRN: [id number] Date of Birth: 11-04-1961 No data recorded  Encounter Date: 10/23/2021   PT End of Session - 10/24/21 1005     Visit Number 3    Number of Visits 13    Date for PT Re-Evaluation 11/15/21    PT Start Time 7867    PT Stop Time 1425    PT Time Calculation (min) 37 min    Activity Tolerance Patient limited by pain    Behavior During Therapy [facility] for tasks assessed/performed             Past Medical History:  Diagnosis Date   Complication of anesthesia    PONV (postoperative nausea and vomiting)     Past Surgical History:  Procedure Laterality Date   AUGMENTATION MAMMAPLASTY Bilateral 2010   BICEPT TENODESIS Right 09/24/2021   Procedure: BICEPS TENODESIS;  Surgeon: [name], MD;  Location: [place];  Service: Orthopedics;  Laterality: Right;   CERVICAL SPINE SURGERY  2003   C5-C7- double dissectomy   PAROTID GLAND TUMOR EXCISION     SHOULDER ARTHROSCOPY Right 09/24/2021   Procedure: right shoulder arthroscopy, debridement,open rotator cuff tear repair;  Surgeon: [name], MD;  Location: [place];  Service: Orthopedics;  Laterality: Right;   WISDOM TOOTH EXTRACTION Bilateral     There were no vitals filed for this visit.   Subjective Assessment - 10/23/21 1359     Subjective Patient did see orthopedic MD last week who took her out of the sling. She reports not doing AAROM exercise today due to increased shoulder pain.    Pertinent History Pt is a 60 y/o female s/p right shoulder arthroscopy with superior labral debridement, biceps tendon release, subacromial decompression, mini open rotator cuff tear repair and biceps tenodesis - surgery preformed on 09/24/21. She had a follow-up appt with Dr. [name] on 10/26 - sling was removed this  date. MD encourages wearing sling at night, otherwise encourages leaving sling off during the day. Shoulder injury occured in March 2022 - she was using a scooter due to an ankle injury. She fell off of the scooter and onto her right shoulder. 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 Of note, pt also reports C5-C7 anterior fusion in 2003.    Limitations Writing;House hold activities;Lifting;Other (comment);Walking;Standing   driving   How long can you sit comfortably? WFL    How long can you stand comfortably? WFL w/ RUE supported    How long can you walk comfortably? 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 Yes    Pain Score 6     Pain Location Shoulder    Pain Orientation Right    Pain Descriptors / Indicators Aching    Pain Type Acute pain    Pain Onset 1 to 4 weeks ago    Pain Frequency Constant    Aggravating Factors  no slight/support, slying supine    Pain Relieving Factors pain meds/heat    Effect of Pain on Daily Activities decreased activity tolerance;    Multiple Pain Sites No  Manual:  In modified supine position at 45 degrees with towels(#2) under R shoulder and heat pad: RUE:  Attempted PROM to RUE shoulder in abduction x5 reps, ER x5 reps with patient reporting increased pain in UE  PT repositioned RUE in position of comfort, attempted forearm supination with stretch felt in shoulder with increased discomfort Patient visibly uncomfortable with increased strain to UE  PT applied cryotherapy to help calm down UE discomfort; Patient reports she use the accordion sling this morning with discomfort but felt like it would help ease it up and it just never got better. Pt reports she did not do the AAROM exercise with broom stick today due to high levels of pain: PT confirmed that when she is  feeling increased pain she should take it easy and ice/elevate for comfort;   PT performed PROM to RUE Shoulder ER to tolerated ROM, unable to achieve neutral x10 reps Attempted shoulder flexion with elbow flexed for comfort x5 reps with sharp pain reported to right lateral shoulder, discontinued;  Patient transitioned to sitting:   UE ranger: -flexion/forward reach and extension x5  -abduction cross body reach x5 Patient reports moderate to severe pain;  Pt educated throughout session about proper posture and technique with exercises. Improved exercise technique, movement at target joints, use of target muscles after min to mod verbal, visual, tactile cues.    Patient reports increased pain this session, becoming tearful with PROM and with AAROM UE ranger. Exercise discontinued due to high levels of pain and patient's difficulty relaxing. Educated patient in using cryotherapy to help reduce inflammation and help with pain control;                     PT Education - 10/24/21 1005     Education Details PROM/positioning    Person(s) Educated Patient    Methods Explanation;Verbal cues    Comprehension Verbalized understanding;Returned demonstration;Verbal cues required;Need further instruction              PT Short Term Goals - 10/18/21 1218       PT SHORT TERM GOAL #1   Title Pt will be independent with HEP in order to improve strength and decrease pain in order to improve pain-free function at home and work.    Time 2    Period Weeks    Status New    Target Date 11/01/21               PT Long Term Goals - 10/18/21 1218       PT LONG TERM GOAL #1   Title Patient will improve right shoulder PROM to > 140 degrees of flexion, scaption, and abduction for improved ability to perform overhead activities.    Baseline 10/18/21: flexion 41*, abd 60*    Time 4    Period Weeks    Status New    Target Date 11/15/21      PT LONG TERM GOAL #2   Title  Patient will improve right shoulder external rotation in order to perform hygiene/ADL's such as hair care pain-free.    Baseline 10/18/21: ER 0*    Time 4    Period Weeks    Status New    Target Date 11/15/21      PT LONG TERM GOAL #3   Title Pt will decrease quick DASH score by at least 8% in order to demonstrate clinically significant reduction in disability.    Baseline 10/18/21: 80%    Time 4  Period Weeks    Status New    Target Date 11/15/21      PT LONG TERM GOAL #4   Title Pt will decrease worst pain as reported on NPRS by at least 3 points in order to demonstrate clinically significant reduction in pain    Baseline 10/18/21: 10/10 pain    Time 4    Period Weeks    Status New    Target Date 11/15/21      PT LONG TERM GOAL #5   Title Patient will increase FOTO score to equal to or greater than 55 to demonstrate statistically significant improvement in mobility and quality of life.    Baseline 10/18/21: 20%    Time 4    Period Weeks    Status New    Target Date 11/15/21                   Plan - 10/24/21 1005     Clinical Impression Statement Patient reports increased pain this session which limited tolerance with session. She reports taking tramadol but was surprised her pain was not diminishing. Patient reported pain with RUE shoulder PROM in all directions becoming tearful at times. PT applied moist heat for comfort, however during middle of session, PT educated patient in cryotherapy and switched over to ice. Patient seemed to tolerate ice better. Educated patient to use heat in the morning to help with stiffness but to use ice if feeling increased pain or after use to help reduce inflammation. Patient is unsure of cause of increased pain this session, could be time of day (1st session in the afternoon) or from overuse as she was active this morning. Session was limited due to severe pain    Personal Factors and Comorbidities Age;Time since onset of  injury/illness/exacerbation    Examination-Activity Limitations Bathing;Reach Overhead;Dressing;Hygiene/Grooming;Toileting;Lift;Sleep;Carry    Examination-Participation Restrictions Driving;Cleaning;Meal Prep;Community Activity;Laundry;Occupation;Shop    Stability/Clinical Decision Making Stable/Uncomplicated    Rehab Potential Good    PT Frequency 3x / week    PT Duration 4 weeks    PT Treatment/Interventions ADLs/Self Care Home Management;Aquatic Therapy;Biofeedback;Electrical Stimulation;Cryotherapy;Iontophoresis 4mg /ml Dexamethasone;Moist Heat;Traction;Ultrasound;DME Instruction;Functional mobility training;Therapeutic activities;Therapeutic exercise;Neuromuscular re-education;Patient/family education;Manual techniques;Scar mobilization;Passive range of motion;Dry needling;Other (comment);Joint Manipulations    PT Next Visit Plan PROM, AAROM, joint mobs - begin session with heat    PT Home Exercise Plan RUE AAROM - flexion, abduction, ER (using PVC) - no printout    Consulted and Agree with Plan of Care Patient             Patient will benefit from skilled therapeutic intervention in order to improve the following deficits and impairments:  Decreased activity tolerance, Decreased range of motion, Hypomobility, Impaired perceived functional ability, Impaired UE functional use, Improper body mechanics, Pain, Decreased mobility  Visit Diagnosis: Traumatic complete tear of right rotator cuff, subsequent encounter  Acute pain of right shoulder  Stiffness of right shoulder, not elsewhere classified     Problem List Patient Active Problem List   Diagnosis Date Noted   Adhesive capsulitis of right shoulder    Complete tear of right rotator cuff    Degenerative superior labral anterior-to-posterior (SLAP) tear of right shoulder    Healthcare maintenance 07/31/2021   Congenital pes cavus 02/25/2021   History of uterine fibroid 01/05/2021   Herpes 07/12/2020   GERD (gastroesophageal  reflux disease) 05/23/2020   Vaginal atrophy 05/23/2020   Cervical spine arthritis 05/23/2020   Osteopenia 05/23/2020   Insomnia 05/20/2020   Postmenopausal hormone  therapy 05/20/2020    Shyann Hefner, PT, DPT 10/24/2021, 10:08 AM  [place] MAIN [facility] SERVICES [address] [place], [place], [address] Phone: [phone]   Fax:  [phone]  Name: Stephen Turnbaugh MRN: [id number] Date of Birth: Jun 09, 1961

## 2021-10-25 ENCOUNTER — Ambulatory Visit: Payer: 59

## 2021-10-25 ENCOUNTER — Other Ambulatory Visit: Payer: Self-pay

## 2021-10-25 DIAGNOSIS — M6281 Muscle weakness (generalized): Secondary | ICD-10-CM

## 2021-10-25 DIAGNOSIS — M25611 Stiffness of right shoulder, not elsewhere classified: Secondary | ICD-10-CM | POA: Diagnosis not present

## 2021-10-25 DIAGNOSIS — S46011D Strain of muscle(s) and tendon(s) of the rotator cuff of right shoulder, subsequent encounter: Secondary | ICD-10-CM | POA: Diagnosis not present

## 2021-10-25 DIAGNOSIS — M25511 Pain in right shoulder: Secondary | ICD-10-CM

## 2021-10-25 NOTE — Therapy (Signed)
[place] MAIN [facility] SERVICES [address] [place], [place], [address] Phone: [phone]   Fax:  [phone]  Physical Therapy Treatment  Patient Details  Name: [name] MRN: [id number] Date of Birth: 27-Jul-1961 No data recorded  Encounter Date: 10/25/2021   PT End of Session - 10/25/21 1155     Visit Number 4    Number of Visits 13    Date for PT Re-Evaluation 11/15/21    PT Start Time 0802    PT Stop Time 9528    PT Time Calculation (min) 42 min    Activity Tolerance Patient limited by pain;Patient tolerated treatment well    Behavior During Therapy [facility] for tasks assessed/performed             Past Medical History:  Diagnosis Date   Complication of anesthesia    PONV (postoperative nausea and vomiting)     Past Surgical History:  Procedure Laterality Date   AUGMENTATION MAMMAPLASTY Bilateral 2010   BICEPT TENODESIS Right 09/24/2021   Procedure: BICEPS TENODESIS;  Surgeon: [name], MD;  Location: [place];  Service: Orthopedics;  Laterality: Right;   CERVICAL SPINE SURGERY  2003   C5-C7- double dissectomy   PAROTID GLAND TUMOR EXCISION     SHOULDER ARTHROSCOPY Right 09/24/2021   Procedure: right shoulder arthroscopy, debridement,open rotator cuff tear repair;  Surgeon: [name], MD;  Location: [place];  Service: Orthopedics;  Laterality: Right;   WISDOM TOOTH EXTRACTION Bilateral     There were no vitals filed for this visit.   Subjective Assessment - 10/25/21 0803     Subjective Pt reports the other day was not a good day for her regarding her pain. She thinks it might have been due to her pain medication wearing off when in came time for her appointment. Pt reports she has been performing HEP.    Pertinent History Pt is a 60 y/o female s/p right shoulder arthroscopy with superior labral debridement, biceps tendon release, subacromial decompression, mini open rotator cuff tear repair and biceps  tenodesis - surgery preformed on 09/24/21. She had a follow-up appt with Dr. [name] on 10/26 - sling was removed this date. MD encourages wearing sling at night, otherwise encourages leaving sling off during the day. Shoulder injury occured in March 2022 - she was using a scooter due to an ankle injury. She fell off of the scooter and onto her right shoulder. 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 Of note, pt also reports C5-C7 anterior fusion in 2003.    Limitations Writing;House hold activities;Lifting;Other (comment);Walking;Standing   driving   How long can you sit comfortably? WFL    How long can you stand comfortably? WFL w/ RUE supported    How long can you walk comfortably? 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 Yes    Pain Score 2     Pain Location Shoulder    Pain Orientation Right    Pain Onset 1 to 4 weeks ago              INTERVENTION   Manual:  Pt seated in modified position at 45 degrees reclined with 2 towels placed under R shoulder and heat pad over R shoulder. Pt reports heat feels good. No adverse reaction  to hot pack. PT performs STM to R shoulder musculature and R bicep and tricep within pt's pain-tolerance x 9 min for pain modulation prior to interventions.  PT performs several minutes and reps of the following PROM interventions:  -R shoulder abduction -R shoulder flexion -R shoulder ER; pt able to achieve 2-3 deg. greater than neutral -R forearm pronation/supination (to neutral/within pain-free/pain limited range, no greater than 3/10 pain).  Comments: PT supported pt's RUE at end range (as long as pain-free) for each movement's last rep for 30 sec.  AAROM- R shoulder flexion, abduction and ER with PT supporting pt's arm x several reps of each. Performed within pain free range or within pain  tolerance (no greater than 3/10).   PT applied cryotherapy x10 min to help calm down UE discomfort following PT AAROM for R shoulder. Pt performed the following AAROM in UE ranger, where PT supervised while SPT also supporting pt's RUE and to guide movement: -flexion/forward reach and extension  -abduction cross body reach  -ER with 30 sec holds at end range (as long as pain free)     Pt rates pain level as 4/10 at end of session. Pt came in with 2/10 pain. PT instructed pt in use of ice/heat at home and rest following session to improve pain after interventions. PT and pt also discussed RUE positioning for sleep. Pt verbalized understanding.     PT Short Term Goals - 10/18/21 1218       PT SHORT TERM GOAL #1   Title Pt will be independent with HEP in order to improve strength and decrease pain in order to improve pain-free function at home and work.    Time 2    Period Weeks    Status New    Target Date 11/01/21               PT Long Term Goals - 10/18/21 1218       PT LONG TERM GOAL #1   Title Patient will improve right shoulder PROM to > 140 degrees of flexion, scaption, and abduction for improved ability to perform overhead activities.    Baseline 10/18/21: flexion 41*, abd 60*    Time 4    Period Weeks    Status New    Target Date 11/15/21      PT LONG TERM GOAL #2   Title Patient will improve right shoulder external rotation in order to perform hygiene/ADL's such as hair care pain-free.    Baseline 10/18/21: ER 0*    Time 4    Period Weeks    Status New    Target Date 11/15/21      PT LONG TERM GOAL #3   Title Pt will decrease quick DASH score by at least 8% in order to demonstrate clinically significant reduction in disability.    Baseline 10/18/21: 80%    Time 4    Period Weeks    Status New    Target Date 11/15/21      PT LONG TERM GOAL #4   Title Pt will decrease worst pain as reported on NPRS by at least 3 points in order to demonstrate clinically  significant reduction in pain    Baseline 10/18/21: 10/10 pain    Time 4    Period Weeks    Status New    Target Date 11/15/21      PT LONG TERM GOAL #5   Title Patient will increase FOTO score to equal to or greater than  55 to demonstrate statistically significant improvement in mobility and quality of life.    Baseline 10/18/21: 20%    Time 4    Period Weeks    Status New    Target Date 11/15/21                   Plan - 10/25/21 1213     Clinical Impression Statement Pt highly motivated to participate in PT. Pt with improved activity tolerance with interventions today compared to previous session/overall less pain-limited. Pt able to achieve 2-3 deg. of ER. The pt will continue to benefit from further skilled PT to improve RUE pain and ROM in order to return pt to PLOF.    Personal Factors and Comorbidities Age;Time since onset of injury/illness/exacerbation    Examination-Activity Limitations Bathing;Reach Overhead;Dressing;Hygiene/Grooming;Toileting;Lift;Sleep;Carry    Examination-Participation Restrictions Driving;Cleaning;Meal Prep;Community Activity;Laundry;Occupation;Shop    Stability/Clinical Decision Making Stable/Uncomplicated    Rehab Potential Good    PT Frequency 3x / week    PT Duration 4 weeks    PT Treatment/Interventions ADLs/Self Care Home Management;Aquatic Therapy;Biofeedback;Electrical Stimulation;Cryotherapy;Iontophoresis 4mg /ml Dexamethasone;Moist Heat;Traction;Ultrasound;DME Instruction;Functional mobility training;Therapeutic activities;Therapeutic exercise;Neuromuscular re-education;Patient/family education;Manual techniques;Scar mobilization;Passive range of motion;Dry needling;Other (comment);Joint Manipulations    PT Next Visit Plan PROM, AAROM, joint mobs - begin session with heat    PT Home Exercise Plan RUE AAROM - flexion, abduction, ER (using PVC) - no printout    Consulted and Agree with Plan of Care Patient             Patient will  benefit from skilled therapeutic intervention in order to improve the following deficits and impairments:  Decreased activity tolerance, Decreased range of motion, Hypomobility, Impaired perceived functional ability, Impaired UE functional use, Improper body mechanics, Pain, Decreased mobility  Visit Diagnosis: Acute pain of right shoulder  Stiffness of right shoulder, not elsewhere classified  Muscle weakness (generalized)     Problem List Patient Active Problem List   Diagnosis Date Noted   Adhesive capsulitis of right shoulder    Complete tear of right rotator cuff    Degenerative superior labral anterior-to-posterior (SLAP) tear of right shoulder    Healthcare maintenance 07/31/2021   Congenital pes cavus 02/25/2021   History of uterine fibroid 01/05/2021   Herpes 07/12/2020   GERD (gastroesophageal reflux disease) 05/23/2020   Vaginal atrophy 05/23/2020   Cervical spine arthritis 05/23/2020   Osteopenia 05/23/2020   Insomnia 05/20/2020   Postmenopausal hormone therapy 05/20/2020    Zollie Pee, PT 10/25/2021, 12:16 PM  [place] MAIN [facility] SERVICES [address] [place], [place], [address] Phone: [phone]   Fax:  [phone]  Name: Annaly Skop MRN: [id number] Date of Birth: 1961-10-02

## 2021-10-28 ENCOUNTER — Other Ambulatory Visit: Payer: Self-pay

## 2021-10-28 ENCOUNTER — Ambulatory Visit: Payer: 59

## 2021-10-28 DIAGNOSIS — M6281 Muscle weakness (generalized): Secondary | ICD-10-CM | POA: Diagnosis not present

## 2021-10-28 DIAGNOSIS — M25511 Pain in right shoulder: Secondary | ICD-10-CM

## 2021-10-28 DIAGNOSIS — S46011D Strain of muscle(s) and tendon(s) of the rotator cuff of right shoulder, subsequent encounter: Secondary | ICD-10-CM | POA: Diagnosis not present

## 2021-10-28 DIAGNOSIS — M25611 Stiffness of right shoulder, not elsewhere classified: Secondary | ICD-10-CM

## 2021-10-28 NOTE — Therapy (Signed)
[place] MAIN [facility] SERVICES [address] [place], [place], [address] Phone: [phone]   Fax:  [phone]  Physical Therapy Treatment  Patient Details  Name: [name] MRN: [id number] Date of Birth: September 18, 1961 No data recorded  Encounter Date: 10/28/2021   PT End of Session - 10/28/21 1440     Visit Number 5    Number of Visits 13    Date for PT Re-Evaluation 11/15/21    PT Start Time 1346    PT Stop Time 1430    PT Time Calculation (min) 44 min    Activity Tolerance Patient limited by pain;Patient tolerated treatment well    Behavior During Therapy [facility] for tasks assessed/performed             Past Medical History:  Diagnosis Date   Complication of anesthesia    PONV (postoperative nausea and vomiting)     Past Surgical History:  Procedure Laterality Date   AUGMENTATION MAMMAPLASTY Bilateral 2010   BICEPT TENODESIS Right 09/24/2021   Procedure: BICEPS TENODESIS;  Surgeon: [name], MD;  Location: [place];  Service: Orthopedics;  Laterality: Right;   CERVICAL SPINE SURGERY  2003   C5-C7- double dissectomy   PAROTID GLAND TUMOR EXCISION     SHOULDER ARTHROSCOPY Right 09/24/2021   Procedure: right shoulder arthroscopy, debridement,open rotator cuff tear repair;  Surgeon: [name], MD;  Location: [place];  Service: Orthopedics;  Laterality: Right;   WISDOM TOOTH EXTRACTION Bilateral     There were no vitals filed for this visit.   Subjective Assessment - 10/28/21 1351     Subjective Patient reports compliance with HEP. Overdid it over the weekend when trying on tops.    Pertinent History Pt is a 60 y/o female s/p right shoulder arthroscopy with superior labral debridement, biceps tendon release, subacromial decompression, mini open rotator cuff tear repair and biceps tenodesis - surgery preformed on 09/24/21. She had a follow-up appt with Dr. [name] on 10/26 - sling was removed this date. MD encourages  wearing sling at night, otherwise encourages leaving sling off during the day. Shoulder injury occured in March 2022 - she was using a scooter due to an ankle injury. She fell off of the scooter and onto her right shoulder. 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 Of note, pt also reports C5-C7 anterior fusion in 2003.    Limitations Writing;House hold activities;Lifting;Other (comment);Walking;Standing   driving   How long can you sit comfortably? WFL    How long can you stand comfortably? WFL w/ RUE supported    How long can you walk comfortably? 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 Yes    Pain Score 3     Pain Location Shoulder    Pain Orientation Right    Pain Descriptors / Indicators Aching    Pain Type Acute pain    Pain Onset 1 to 4 weeks ago    Pain Frequency Intermittent                  Manual:  In modified supine position at 45 degrees with towel under R shoulder and heat pad: RUE:  -PROM flexion 10x 20 second holds -PROM abduction 10x 20 second holds -PROM ER 10x 20 second holds  -  PROM bicep extension and flexion 15x 5 second holds  -distraction 10x 20 second holds -grade I gentle AP, PA mobs for pain reduction 3 minutes -scar tissue massage x 4 minutes  -intermittent pertubation's for pain reduction throughout session   Flexion PROM 95 Abduction PROM 61    TherEx UE ranger: -flexion/forward reach and extension 10x 10 second holds -abduction cross body reach 10x 10 second holds -circle 10x    End of session ice applied in seated position with pillow under arm after session (no charge)   Access Code: [email or web address] URL: [email or web address] Date: 10/28/2021 Prepared by: Janna Arch  Exercises Shoulder Horizontal Abduction/Adduction on Table - 1 x daily - 7 x weekly - 2 sets -  10 reps - 5 hold   Pt educated throughout session about proper posture and technique with exercises. Improved exercise technique, movement at target joints, use of target muscles after min to mod verbal, visual, tactile cues.      Patient presents with improved shoulder PROM to 95 degrees in semi supine position. Her abduction continues to be limited at this time and will be an area for further progression. An additional HEP was added for abduction with patient demonstrating understanding. Patient tolerated all interventions well this session. The pt will continue to benefit from further skilled PT to improve RUE pain and ROM in order to return pt to PLOF.                    PT Education - 10/28/21 1440     Education Details exercise technique, body mechanics    Person(s) Educated Patient    Methods Explanation;Demonstration;Tactile cues;Verbal cues    Comprehension Verbalized understanding;Returned demonstration;Verbal cues required;Tactile cues required              PT Short Term Goals - 10/18/21 1218       PT SHORT TERM GOAL #1   Title Pt will be independent with HEP in order to improve strength and decrease pain in order to improve pain-free function at home and work.    Time 2    Period Weeks    Status New    Target Date 11/01/21               PT Long Term Goals - 10/18/21 1218       PT LONG TERM GOAL #1   Title Patient will improve right shoulder PROM to > 140 degrees of flexion, scaption, and abduction for improved ability to perform overhead activities.    Baseline 10/18/21: flexion 41*, abd 60*    Time 4    Period Weeks    Status New    Target Date 11/15/21      PT LONG TERM GOAL #2   Title Patient will improve right shoulder external rotation in order to perform hygiene/ADL's such as hair care pain-free.    Baseline 10/18/21: ER 0*    Time 4    Period Weeks    Status New    Target Date 11/15/21      PT LONG TERM GOAL #3   Title Pt  will decrease quick DASH score by at least 8% in order to demonstrate clinically significant reduction in disability.    Baseline 10/18/21: 80%    Time 4    Period Weeks    Status New    Target Date 11/15/21      PT LONG TERM GOAL #4   Title Pt will decrease worst pain as reported on  NPRS by at least 3 points in order to demonstrate clinically significant reduction in pain    Baseline 10/18/21: 10/10 pain    Time 4    Period Weeks    Status New    Target Date 11/15/21      PT LONG TERM GOAL #5   Title Patient will increase FOTO score to equal to or greater than 55 to demonstrate statistically significant improvement in mobility and quality of life.    Baseline 10/18/21: 20%    Time 4    Period Weeks    Status New    Target Date 11/15/21                   Plan - 10/28/21 1441     Clinical Impression Statement Patient presents with improved shoulder PROM to 95 degrees in semi supine position. Her abduction continues to be limited at this time and will be an area for further progression. An additional HEP was added for abduction with patient demonstrating understanding. Patient tolerated all interventions well this session. The pt will continue to benefit from further skilled PT to improve RUE pain and ROM in order to return pt to PLOF.    Personal Factors and Comorbidities Age;Time since onset of injury/illness/exacerbation    Examination-Activity Limitations Bathing;Reach Overhead;Dressing;Hygiene/Grooming;Toileting;Lift;Sleep;Carry    Examination-Participation Restrictions Driving;Cleaning;Meal Prep;Community Activity;Laundry;Occupation;Shop    Stability/Clinical Decision Making Stable/Uncomplicated    Rehab Potential Good    PT Frequency 3x / week    PT Duration 4 weeks    PT Treatment/Interventions ADLs/Self Care Home Management;Aquatic Therapy;Biofeedback;Electrical Stimulation;Cryotherapy;Iontophoresis 4mg /ml Dexamethasone;Moist Heat;Traction;Ultrasound;DME  Instruction;Functional mobility training;Therapeutic activities;Therapeutic exercise;Neuromuscular re-education;Patient/family education;Manual techniques;Scar mobilization;Passive range of motion;Dry needling;Other (comment);Joint Manipulations    PT Next Visit Plan PROM, AAROM, joint mobs - begin session with heat    PT Home Exercise Plan RUE AAROM - flexion, abduction, ER (using PVC) - no printout    Consulted and Agree with Plan of Care Patient             Patient will benefit from skilled therapeutic intervention in order to improve the following deficits and impairments:  Decreased activity tolerance, Decreased range of motion, Hypomobility, Impaired perceived functional ability, Impaired UE functional use, Improper body mechanics, Pain, Decreased mobility  Visit Diagnosis: Acute pain of right shoulder  Stiffness of right shoulder, not elsewhere classified  Muscle weakness (generalized)     Problem List Patient Active Problem List   Diagnosis Date Noted   Adhesive capsulitis of right shoulder    Complete tear of right rotator cuff    Degenerative superior labral anterior-to-posterior (SLAP) tear of right shoulder    Healthcare maintenance 07/31/2021   Congenital pes cavus 02/25/2021   History of uterine fibroid 01/05/2021   Herpes 07/12/2020   GERD (gastroesophageal reflux disease) 05/23/2020   Vaginal atrophy 05/23/2020   Cervical spine arthritis 05/23/2020   Osteopenia 05/23/2020   Insomnia 05/20/2020   Postmenopausal hormone therapy 05/20/2020    Janna Arch, PT, DPT  10/28/2021, 2:43 PM  [place] MAIN [facility] SERVICES [address] [place], [place], [address] Phone: [phone]   Fax:  [phone]  Name: Stephanee Barcomb MRN: [id number] Date of Birth: 11-17-1961

## 2021-10-29 ENCOUNTER — Encounter: Payer: Self-pay | Admitting: Physical Therapy

## 2021-10-29 ENCOUNTER — Other Ambulatory Visit: Payer: Self-pay

## 2021-10-29 ENCOUNTER — Other Ambulatory Visit: Payer: Self-pay | Admitting: Orthopedic Surgery

## 2021-10-29 ENCOUNTER — Ambulatory Visit: Payer: 59 | Admitting: Physical Therapy

## 2021-10-29 DIAGNOSIS — M6281 Muscle weakness (generalized): Secondary | ICD-10-CM

## 2021-10-29 DIAGNOSIS — M25611 Stiffness of right shoulder, not elsewhere classified: Secondary | ICD-10-CM | POA: Diagnosis not present

## 2021-10-29 DIAGNOSIS — S46011D Strain of muscle(s) and tendon(s) of the rotator cuff of right shoulder, subsequent encounter: Secondary | ICD-10-CM | POA: Diagnosis not present

## 2021-10-29 DIAGNOSIS — M25511 Pain in right shoulder: Secondary | ICD-10-CM

## 2021-10-29 MED ORDER — GABAPENTIN 300 MG PO CAPS
300.0000 mg | ORAL_CAPSULE | Freq: Three times a day (TID) | ORAL | 0 refills | Status: DC
  Filled 2021-10-29: qty 60, 20d supply, fill #0

## 2021-10-29 NOTE — Therapy (Signed)
[place] MAIN [facility] SERVICES [address] [place], [place], [address] Phone: [phone]   Fax:  [phone]  Physical Therapy Treatment  Patient Details  Name: [name] MRN: [id number] Date of Birth: Apr 15, 1961 No data recorded  Encounter Date: 10/29/2021   PT End of Session - 10/29/21 1228     Visit Number 6    Number of Visits 13    Date for PT Re-Evaluation 11/15/21    PT Start Time 0932    PT Stop Time 1015    PT Time Calculation (min) 43 min    Activity Tolerance Patient limited by pain;Patient tolerated treatment well    Behavior During Therapy [facility] for tasks assessed/performed             Past Medical History:  Diagnosis Date   Complication of anesthesia    PONV (postoperative nausea and vomiting)     Past Surgical History:  Procedure Laterality Date   AUGMENTATION MAMMAPLASTY Bilateral 2010   BICEPT TENODESIS Right 09/24/2021   Procedure: BICEPS TENODESIS;  Surgeon: [name], MD;  Location: [place];  Service: Orthopedics;  Laterality: Right;   CERVICAL SPINE SURGERY  2003   C5-C7- double dissectomy   PAROTID GLAND TUMOR EXCISION     SHOULDER ARTHROSCOPY Right 09/24/2021   Procedure: right shoulder arthroscopy, debridement,open rotator cuff tear repair;  Surgeon: [name], MD;  Location: [place];  Service: Orthopedics;  Laterality: Right;   WISDOM TOOTH EXTRACTION Bilateral     There were no vitals filed for this visit.   Subjective Assessment - 10/29/21 0938     Subjective Patient reports compliance with HEP. Reports her shoulder is feeling better this morning. She reports minimal discomfort;    Pertinent History Pt is a 60 y/o female s/p right shoulder arthroscopy with superior labral debridement, biceps tendon release, subacromial decompression, mini open rotator cuff tear repair and biceps tenodesis - surgery preformed on 09/24/21. She had a follow-up appt with Dr. [name] on 10/26 - sling was  removed this date. MD encourages wearing sling at night, otherwise encourages leaving sling off during the day. Shoulder injury occured in March 2022 - she was using a scooter due to an ankle injury. She fell off of the scooter and onto her right shoulder. 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 Of note, pt also reports C5-C7 anterior fusion in 2003.    Limitations Writing;House hold activities;Lifting;Other (comment);Walking;Standing   driving   How long can you sit comfortably? WFL    How long can you stand comfortably? WFL w/ RUE supported    How long can you walk comfortably? 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 Yes    Pain Score 3     Pain Location Shoulder    Pain Orientation Right    Pain Descriptors / Indicators Aching    Pain Type Acute pain    Pain Onset 1 to 4 weeks ago    Pain Frequency Constant    Aggravating Factors  no support/lying position,    Pain Relieving Factors pain meds/heat    Effect of Pain on Daily Activities decreased activity tolerance;    Multiple Pain Sites No  Manual:  In modified supine position at 45 degrees with towel under R shoulder and heat pad: RUE:  -PROM flexion 10x 20 second holds -PROM abduction 10x 20 second holds -PROM ER 10x 20 second holds  -PROM bicep extension and flexion 15x 5 second holds  -distraction 10x 20 second holds -grade I gentle AP, PA mobs for pain reduction 3 minutes -scar tissue massage x 4 minutes  -intermittent pertubation's for pain reduction throughout session   Abduction PROM 74    TherEx UE ranger: -flexion/forward reach and extension 15x 10 second holds with progressive raising of UE ranger after 5 reps to facilitate better shoulder flexion ROM;  -abduction cross body reach 10x 10 second holds -circle 10x clockwise and  counterclockwise Required cues to avoid shoulder elevation for better ROM and positioning;     End of session ice applied in seated position with pillow under arm after session (no charge)     Pt educated throughout session about proper posture and technique with exercises. Improved exercise technique, movement at target joints, use of target muscles after min to mod verbal, visual, tactile cues.    Tolerated fair. Continues to be limited by shoulder pain. Requires cues/distraction to help relax UE for better tolerance with PROM; Able to progress passive abduction this session;                                 PT Education - 10/29/21 1227     Education Details exercise technique/positioning    Person(s) Educated Patient    Methods Explanation;Verbal cues    Comprehension Verbalized understanding;Returned demonstration;Verbal cues required;Need further instruction              PT Short Term Goals - 10/18/21 1218       PT SHORT TERM GOAL #1   Title Pt will be independent with HEP in order to improve strength and decrease pain in order to improve pain-free function at home and work.    Time 2    Period Weeks    Status New    Target Date 11/01/21               PT Long Term Goals - 10/18/21 1218       PT LONG TERM GOAL #1   Title Patient will improve right shoulder PROM to > 140 degrees of flexion, scaption, and abduction for improved ability to perform overhead activities.    Baseline 10/18/21: flexion 41*, abd 60*    Time 4    Period Weeks    Status New    Target Date 11/15/21      PT LONG TERM GOAL #2   Title Patient will improve right shoulder external rotation in order to perform hygiene/ADL's such as hair care pain-free.    Baseline 10/18/21: ER 0*    Time 4    Period Weeks    Status New    Target Date 11/15/21      PT LONG TERM GOAL #3   Title Pt will decrease quick DASH score by at least 8% in order to demonstrate clinically  significant reduction in disability.    Baseline 10/18/21: 80%    Time 4    Period Weeks    Status New    Target Date 11/15/21      PT LONG TERM GOAL #4   Title Pt will decrease worst pain as reported on NPRS by at least 3  points in order to demonstrate clinically significant reduction in pain    Baseline 10/18/21: 10/10 pain    Time 4    Period Weeks    Status New    Target Date 11/15/21      PT LONG TERM GOAL #5   Title Patient will increase FOTO score to equal to or greater than 55 to demonstrate statistically significant improvement in mobility and quality of life.    Baseline 10/18/21: 20%    Time 4    Period Weeks    Status New    Target Date 11/15/21                   Plan - 10/29/21 1229     Clinical Impression Statement Patient motivated and participated fair within session. Patient does continue to be limited by pain in shoulder. She does seem to prefer PROM with shoulder in long axis position rather than in flexed position. She continues to be most limited in shoulder abduction/ER. Patient does seem to relax better with distraction. Instructed patient in AAROM with UE ranger. She does require cues for relaxing shoulder for better tolerance with exercise. Finished with ice for pain control. She would benefit from additional skilled PT Intervention to improve ROM/strength and mobility;    Personal Factors and Comorbidities Age;Time since onset of injury/illness/exacerbation    Examination-Activity Limitations Bathing;Reach Overhead;Dressing;Hygiene/Grooming;Toileting;Lift;Sleep;Carry    Examination-Participation Restrictions Driving;Cleaning;Meal Prep;Community Activity;Laundry;Occupation;Shop    Stability/Clinical Decision Making Stable/Uncomplicated    Rehab Potential Good    PT Frequency 3x / week    PT Duration 4 weeks    PT Treatment/Interventions ADLs/Self Care Home Management;Aquatic Therapy;Biofeedback;Electrical Stimulation;Cryotherapy;Iontophoresis  4mg /ml Dexamethasone;Moist Heat;Traction;Ultrasound;DME Instruction;Functional mobility training;Therapeutic activities;Therapeutic exercise;Neuromuscular re-education;Patient/family education;Manual techniques;Scar mobilization;Passive range of motion;Dry needling;Other (comment);Joint Manipulations    PT Next Visit Plan PROM, AAROM, joint mobs - begin session with heat    PT Home Exercise Plan RUE AAROM - flexion, abduction, ER (using PVC) - no printout    Consulted and Agree with Plan of Care Patient             Patient will benefit from skilled therapeutic intervention in order to improve the following deficits and impairments:  Decreased activity tolerance, Decreased range of motion, Hypomobility, Impaired perceived functional ability, Impaired UE functional use, Improper body mechanics, Pain, Decreased mobility  Visit Diagnosis: Acute pain of right shoulder  Stiffness of right shoulder, not elsewhere classified  Muscle weakness (generalized)     Problem List Patient Active Problem List   Diagnosis Date Noted   Adhesive capsulitis of right shoulder    Complete tear of right rotator cuff    Degenerative superior labral anterior-to-posterior (SLAP) tear of right shoulder    Healthcare maintenance 07/31/2021   Congenital pes cavus 02/25/2021   History of uterine fibroid 01/05/2021   Herpes 07/12/2020   GERD (gastroesophageal reflux disease) 05/23/2020   Vaginal atrophy 05/23/2020   Cervical spine arthritis 05/23/2020   Osteopenia 05/23/2020   Insomnia 05/20/2020   Postmenopausal hormone therapy 05/20/2020    Zephan Beauchaine, PT, DPT 10/29/2021, 12:33 PM  [place] MAIN [facility] SERVICES [address] [place], [place], [address] Phone: [phone]   Fax:  [phone]  Name: [name] MRN: [id number] Date of Birth: 1961-10-27

## 2021-10-29 NOTE — Telephone Encounter (Signed)
Please advise 

## 2021-10-31 ENCOUNTER — Ambulatory Visit: Payer: 59 | Admitting: Physical Therapy

## 2021-10-31 ENCOUNTER — Encounter: Payer: Self-pay | Admitting: Physical Therapy

## 2021-10-31 ENCOUNTER — Other Ambulatory Visit: Payer: Self-pay

## 2021-10-31 DIAGNOSIS — S46011D Strain of muscle(s) and tendon(s) of the rotator cuff of right shoulder, subsequent encounter: Secondary | ICD-10-CM | POA: Diagnosis not present

## 2021-10-31 DIAGNOSIS — M25611 Stiffness of right shoulder, not elsewhere classified: Secondary | ICD-10-CM | POA: Diagnosis not present

## 2021-10-31 DIAGNOSIS — M25511 Pain in right shoulder: Secondary | ICD-10-CM

## 2021-10-31 DIAGNOSIS — M6281 Muscle weakness (generalized): Secondary | ICD-10-CM

## 2021-10-31 NOTE — Patient Instructions (Signed)
Access Code: [email or web address] URL: [email or web address] Date: 10/31/2021 Prepared by: Blanche East  Exercises Seated Shoulder Flexion Towel Slide at Table Top - 1 x daily - 7 x weekly - 2 sets - 10 reps - 5 sec hold Seated Shoulder External Rotation PROM on Table - 1 x daily - 7 x weekly - 2 sets - 10 reps - 5 sec hold

## 2021-10-31 NOTE — Therapy (Addendum)
[place] MAIN [facility] SERVICES [address] [place], [place], [address] Phone: [phone]   Fax:  [phone]  Physical Therapy Treatment  Patient Details  Name: [name] MRN: [id number] Date of Birth: 1961/09/02 No data recorded  Encounter Date: 10/31/2021   PT End of Session - 10/31/21 0941     Visit Number 7   Number of Visits 13    Date for PT Re-Evaluation 11/15/21    PT Start Time 0933    PT Stop Time 1015    PT Time Calculation (min) 42 min    Activity Tolerance Patient limited by pain;Patient tolerated treatment well    Behavior During Therapy [facility] for tasks assessed/performed             Past Medical History:  Diagnosis Date   Complication of anesthesia    PONV (postoperative nausea and vomiting)     Past Surgical History:  Procedure Laterality Date   AUGMENTATION MAMMAPLASTY Bilateral 2010   BICEPT TENODESIS Right 09/24/2021   Procedure: BICEPS TENODESIS;  Surgeon: [name], MD;  Location: [place];  Service: Orthopedics;  Laterality: Right;   CERVICAL SPINE SURGERY  2003   C5-C7- double dissectomy   PAROTID GLAND TUMOR EXCISION     SHOULDER ARTHROSCOPY Right 09/24/2021   Procedure: right shoulder arthroscopy, debridement,open rotator cuff tear repair;  Surgeon: [name], MD;  Location: [place];  Service: Orthopedics;  Laterality: Right;   WISDOM TOOTH EXTRACTION Bilateral     There were no vitals filed for this visit.   Subjective Assessment - 10/31/21 0940     Subjective Patient reports compliance with HEP. Patient reports she has been using the CPM machine and has been able to increase to 90 degrees;    Pertinent History Pt is a 60 y/o female s/p right shoulder arthroscopy with superior labral debridement, biceps tendon release, subacromial decompression, mini open rotator cuff tear repair and biceps tenodesis - surgery preformed on 09/24/21. She had a follow-up appt with Dr. [name] on 10/26 -  sling was removed this date. MD encourages wearing sling at night, otherwise encourages leaving sling off during the day. Shoulder injury occured in March 2022 - she was using a scooter due to an ankle injury. She fell off of the scooter and onto her right shoulder. 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 Of note, pt also reports C5-C7 anterior fusion in 2003.    Limitations Writing;House hold activities;Lifting;Other (comment);Walking;Standing   driving   How long can you sit comfortably? WFL    How long can you stand comfortably? WFL w/ RUE supported    How long can you walk comfortably? 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 No/denies    Pain Onset 1 to 4 weeks ago                [facility] PT Assessment - 10/31/21 0001       ROM / Strength   AROM / PROM / Strength PROM      PROM   PROM Assessment Site Shoulder    Right/Left Shoulder Right    Right Shoulder Flexion 99 Degrees    Right Shoulder ABduction 76 Degrees    Right Shoulder External Rotation 20 Degrees   in sitting, shoulder in  0 degrees flexion             Manual:  In modified supine position at 45 degrees with towel under R shoulder and heat pad: RUE:  -PROM flexion 10x 20 second holds -PROM abduction 10x 20 second holds -PROM ER 10x 20 second holds, with pain reported and increased guarding;  -PROM bicep extension and flexion 15x 5 second holds  -distraction 10x 20 second holds -grade I gentle AP, PA mobs to right glenohumeral joint for pain reduction 3 minutes -scar tissue massage x 4 minutes      TherEx Standing with wand: -RUE AAROM shoulder flexion/abduction/ER x5 reps each with cues to keep shoulder depression and avoid shoulder elevation; Standing BUE elbow flexion/extension with cues to increase stretch into extension x10  reps;  Educated patient in table shoulder flexion AAROM RUE x5 reps Educated patient in passive shoulder ER 5 sec hold x5 reps with cues for positioning to avoid discomfort;  Provided written HEP for adherence. See patient instructions End of session ice applied in seated position with pillow under arm after session (no charge)     Pt educated throughout session about proper posture and technique with exercises. Improved exercise technique, movement at target joints, use of target muscles after min to mod verbal, visual, tactile cues.    Tolerated fair. Continues to be limited by shoulder pain. Requires cues/distraction to help relax UE for better tolerance with PROM; Able to progress passive ROM this session;                                   PT Education - 10/31/21 0941     Education Details shoulder ROM/positioning;    Person(s) Educated Patient    Methods Explanation;Verbal cues    Comprehension Verbalized understanding;Returned demonstration;Verbal cues required;Need further instruction              PT Short Term Goals - 10/18/21 1218       PT SHORT TERM GOAL #1   Title Pt will be independent with HEP in order to improve strength and decrease pain in order to improve pain-free function at home and work.    Time 2    Period Weeks    Status New    Target Date 11/01/21               PT Long Term Goals - 10/18/21 1218       PT LONG TERM GOAL #1   Title Patient will improve right shoulder PROM to > 140 degrees of flexion, scaption, and abduction for improved ability to perform overhead activities.    Baseline 10/18/21: flexion 41*, abd 60*    Time 4    Period Weeks    Status New    Target Date 11/15/21      PT LONG TERM GOAL #2   Title Patient will improve right shoulder external rotation in order to perform hygiene/ADL's such as hair care pain-free.    Baseline 10/18/21: ER 0*    Time 4    Period Weeks    Status New    Target  Date 11/15/21      PT LONG TERM GOAL #3   Title Pt will decrease quick DASH score by at least 8% in order to demonstrate clinically significant reduction in disability.    Baseline 10/18/21: 80%    Time 4    Period Weeks    Status  New    Target Date 11/15/21      PT LONG TERM GOAL #4   Title Pt will decrease worst pain as reported on NPRS by at least 3 points in order to demonstrate clinically significant reduction in pain    Baseline 10/18/21: 10/10 pain    Time 4    Period Weeks    Status New    Target Date 11/15/21      PT LONG TERM GOAL #5   Title Patient will increase FOTO score to equal to or greater than 55 to demonstrate statistically significant improvement in mobility and quality of life.    Baseline 10/18/21: 20%    Time 4    Period Weeks    Status New    Target Date 11/15/21                   Plan - 10/31/21 1026     Clinical Impression Statement Patient motivated and participated well within session. She continues to have increased pain right shoulder with increased shoulder elevation guarding during most PROM. However with slower movement and hold at end range is able to relax shoulder elevation for better glenohumeral joint mobility. Patient was able to achieve better PROM this session. She was educated in [place] with wand with cues to avoid shoulder elevation. Educated patient in table exercise to allow increase HEP adherence while traveling out of town. Patient would benefit from additional skilled PT Intervention to improve ROM, strength and reduce pain;    Personal Factors and Comorbidities Age;Time since onset of injury/illness/exacerbation    Examination-Activity Limitations Bathing;Reach Overhead;Dressing;Hygiene/Grooming;Toileting;Lift;Sleep;Carry    Examination-Participation Restrictions Driving;Cleaning;Meal Prep;Community Activity;Laundry;Occupation;Shop    Stability/Clinical Decision Making Stable/Uncomplicated    Rehab Potential Good    PT  Frequency 3x / week    PT Duration 4 weeks    PT Treatment/Interventions ADLs/Self Care Home Management;Aquatic Therapy;Biofeedback;Electrical Stimulation;Cryotherapy;Iontophoresis 4mg /ml Dexamethasone;Moist Heat;Traction;Ultrasound;DME Instruction;Functional mobility training;Therapeutic activities;Therapeutic exercise;Neuromuscular re-education;Patient/family education;Manual techniques;Scar mobilization;Passive range of motion;Dry needling;Other (comment);Joint Manipulations    PT Next Visit Plan PROM, AAROM, joint mobs - begin session with heat    PT Home Exercise Plan RUE AAROM - flexion, abduction, ER (using PVC) - no printout    Consulted and Agree with Plan of Care Patient             Patient will benefit from skilled therapeutic intervention in order to improve the following deficits and impairments:  Decreased activity tolerance, Decreased range of motion, Hypomobility, Impaired perceived functional ability, Impaired UE functional use, Improper body mechanics, Pain, Decreased mobility  Visit Diagnosis: Acute pain of right shoulder  Stiffness of right shoulder, not elsewhere classified  Muscle weakness (generalized)     Problem List Patient Active Problem List   Diagnosis Date Noted   Adhesive capsulitis of right shoulder    Complete tear of right rotator cuff    Degenerative superior labral anterior-to-posterior (SLAP) tear of right shoulder    Healthcare maintenance 07/31/2021   Congenital pes cavus 02/25/2021   History of uterine fibroid 01/05/2021   Herpes 07/12/2020   GERD (gastroesophageal reflux disease) 05/23/2020   Vaginal atrophy 05/23/2020   Cervical spine arthritis 05/23/2020   Osteopenia 05/23/2020   Insomnia 05/20/2020   Postmenopausal hormone therapy 05/20/2020    Stormee Duda, PT, DPT 10/31/2021, 10:33 AM  [place] [address] [place], [place], [address] Phone: [phone]   Fax:   [phone]  Name: [name] MRN: [id number] Date of Birth:  06/20/1961    

## 2021-11-01 ENCOUNTER — Other Ambulatory Visit: Payer: Self-pay | Admitting: Surgical

## 2021-11-01 ENCOUNTER — Other Ambulatory Visit: Payer: Self-pay

## 2021-11-01 MED ORDER — GABAPENTIN 300 MG PO CAPS
300.0000 mg | ORAL_CAPSULE | Freq: Three times a day (TID) | ORAL | 1 refills | Status: DC
  Filled 2021-11-01 – 2021-11-19 (×2): qty 60, 20d supply, fill #0
  Filled 2021-12-13: qty 60, 20d supply, fill #1

## 2021-11-01 MED ORDER — TRAMADOL HCL 50 MG PO TABS
50.0000 mg | ORAL_TABLET | Freq: Three times a day (TID) | ORAL | 0 refills | Status: DC | PRN
  Filled 2021-11-01: qty 40, 14d supply, fill #0

## 2021-11-01 MED ORDER — METHOCARBAMOL 500 MG PO TABS
500.0000 mg | ORAL_TABLET | Freq: Three times a day (TID) | ORAL | 1 refills | Status: DC | PRN
  Filled 2021-11-01: qty 30, 10d supply, fill #0
  Filled 2021-11-19: qty 30, 10d supply, fill #1

## 2021-11-05 ENCOUNTER — Other Ambulatory Visit: Payer: Self-pay

## 2021-11-05 ENCOUNTER — Ambulatory Visit: Payer: 59 | Admitting: Physical Therapy

## 2021-11-05 DIAGNOSIS — M25611 Stiffness of right shoulder, not elsewhere classified: Secondary | ICD-10-CM

## 2021-11-05 DIAGNOSIS — M25511 Pain in right shoulder: Secondary | ICD-10-CM

## 2021-11-05 DIAGNOSIS — S46011D Strain of muscle(s) and tendon(s) of the rotator cuff of right shoulder, subsequent encounter: Secondary | ICD-10-CM | POA: Diagnosis not present

## 2021-11-05 DIAGNOSIS — M6281 Muscle weakness (generalized): Secondary | ICD-10-CM | POA: Diagnosis not present

## 2021-11-05 NOTE — Therapy (Signed)
[place] MAIN [facility] SERVICES [address] [place], [place], [address] Phone: [phone]   Fax:  [phone]  Physical Therapy Treatment  Patient Details  Name: [name] MRN: [id number] Date of Birth: [birth year] No data recorded  Encounter Date: 11/05/2021   PT End of Session - 11/05/21 1605     Visit Number 8    Number of Visits 13    Date for PT Re-Evaluation 11/15/21    Activity Tolerance Patient limited by pain;Patient tolerated treatment well    Behavior During Therapy [facility] for tasks assessed/performed             Past Medical History:  Diagnosis Date   Complication of anesthesia    PONV (postoperative nausea and vomiting)     Past Surgical History:  Procedure Laterality Date   AUGMENTATION MAMMAPLASTY Bilateral 2010   BICEPT TENODESIS Right 09/24/2021   Procedure: BICEPS TENODESIS;  Surgeon: [name], MD;  Location: [place];  Service: Orthopedics;  Laterality: Right;   CERVICAL SPINE SURGERY  2003   C5-C7- double dissectomy   PAROTID GLAND TUMOR EXCISION     SHOULDER ARTHROSCOPY Right 09/24/2021   Procedure: right shoulder arthroscopy, debridement,open rotator cuff tear repair;  Surgeon: [name], MD;  Location: [place];  Service: Orthopedics;  Laterality: Right;   WISDOM TOOTH EXTRACTION Bilateral     There were no vitals filed for this visit.   Subjective Assessment - 11/05/21 1315     Subjective Patient reports compliance with HEP.Pt reports she had a LOB last night and reached to grab with her R UE. Pt reports some soreness in the back but no significant discomfort in the area of the surgery.    Pertinent History Pt is a 60 y/o female s/p right shoulder arthroscopy with superior labral debridement, biceps tendon release, subacromial decompression, mini open rotator cuff tear repair and biceps tenodesis - surgery preformed on 09/24/21. She had a follow-up appt with Dr. [name] on 10/26 - sling was  removed this date. MD encourages wearing sling at night, otherwise encourages leaving sling off during the day. Shoulder injury occured in March 2022 - she was using a scooter due to an ankle injury. She fell off of the scooter and onto her right shoulder. 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 Of note, pt also reports C5-C7 anterior fusion in 2003.    Limitations Writing;House hold activities;Lifting;Other (comment);Walking;Standing    How long can you sit comfortably? WFL    How long can you stand comfortably? WFL w/ RUE supported    How long can you walk comfortably? 30 minutes w/o sling    Patient Stated Goals full ROM, eventually return to work Investment banker, corporate at [employer]), be able to use arm w/o pain    Pain Score 3     Pain Location Shoulder    Pain Orientation Right    Pain Descriptors / Indicators Aching              Treatment provided this session      Manual Therapy:  In modified supine position at 45 degrees with towel under R shoulder and heat pad: RUE:  -PROM flexion 10x 20 second holds -PROM abduction 10x 20 second holds -PROM ER in 35 degrees shoulder abduction 10x 20 second holds, with pain reported and increased guarding;  -distraction 10x 20 second holds -grade I gentle AP, PA mobs to right glenohumeral joint  for pain reduction 3 minutes  Therex:  Flexion table slides 10 x with 5 second holds, cues for shoulder relaxation  Pt educated throughout session about proper posture and technique with exercises. Improved exercise technique, movement at target joints, use of target muscles after min to mod verbal, visual, tactile cues.   Ice applied to shoulder following treatment session x 6 min - NO charge                         PT Education - 11/05/21 1604     Education Details progress with therapy thus far    Person(s) Educated Patient    Methods Explanation    Comprehension Verbalized  understanding              PT Short Term Goals - 10/18/21 1218       PT SHORT TERM GOAL #1   Title Pt will be independent with HEP in order to improve strength and decrease pain in order to improve pain-free function at home and work.    Time 2    Period Weeks    Status New    Target Date 11/01/21               PT Long Term Goals - 10/18/21 1218       PT LONG TERM GOAL #1   Title Patient will improve right shoulder PROM to > 140 degrees of flexion, scaption, and abduction for improved ability to perform overhead activities.    Baseline 10/18/21: flexion 41*, abd 60*    Time 4    Period Weeks    Status New    Target Date 11/15/21      PT LONG TERM GOAL #2   Title Patient will improve right shoulder external rotation in order to perform hygiene/ADL's such as hair care pain-free.    Baseline 10/18/21: ER 0*    Time 4    Period Weeks    Status New    Target Date 11/15/21      PT LONG TERM GOAL #3   Title Pt will decrease quick DASH score by at least 8% in order to demonstrate clinically significant reduction in disability.    Baseline 10/18/21: 80%    Time 4    Period Weeks    Status New    Target Date 11/15/21      PT LONG TERM GOAL #4   Title Pt will decrease worst pain as reported on NPRS by at least 3 points in order to demonstrate clinically significant reduction in pain    Baseline 10/18/21: 10/10 pain    Time 4    Period Weeks    Status New    Target Date 11/15/21      PT LONG TERM GOAL #5   Title Patient will increase FOTO score to equal to or greater than 55 to demonstrate statistically significant improvement in mobility and quality of life.    Baseline 10/18/21: 20%    Time 4    Period Weeks    Status New    Target Date 11/15/21                   Plan - 11/06/21 0807     Clinical Impression Statement Pt continues to show good motivation for completion of therapy program as well as adherance to HEP. Pt continues to have pain and  muscle guarding with shoulder elevation and has most liimitations with shoulder external  rotation in comparison to other shoulder motions. Pt was able to further relax with shoulder elevation and demosntrate less muscle guarding following cues. Pt continued to be educated regarding HEP and AAROM including table slides and wand exercises. Pt will continue to benefit from continued skilled PT intervention to restore her shoulder ROM, strength and overall function.    Personal Factors and Comorbidities Age;Time since onset of injury/illness/exacerbation    Examination-Activity Limitations Bathing;Reach Overhead;Dressing;Hygiene/Grooming;Toileting;Lift;Sleep;Carry    Examination-Participation Restrictions Driving;Cleaning;Meal Prep;Community Activity;Laundry;Occupation;Shop    Stability/Clinical Decision Making Stable/Uncomplicated    Rehab Potential Good    PT Frequency 3x / week    PT Duration 4 weeks    PT Treatment/Interventions ADLs/Self Care Home Management;Aquatic Therapy;Biofeedback;Electrical Stimulation;Cryotherapy;Iontophoresis 4mg /ml Dexamethasone;Moist Heat;Traction;Ultrasound;DME Instruction;Functional mobility training;Therapeutic activities;Therapeutic exercise;Neuromuscular re-education;Patient/family education;Manual techniques;Scar mobilization;Passive range of motion;Dry needling;Other (comment);Joint Manipulations    PT Next Visit Plan PROM, AAROM, joint mobs - begin session with heat    PT Home Exercise Plan RUE AAROM - flexion, abduction, ER (using PVC) - no printout    Consulted and Agree with Plan of Care Patient             Patient will benefit from skilled therapeutic intervention in order to improve the following deficits and impairments:  Decreased activity tolerance, Decreased range of motion, Hypomobility, Impaired perceived functional ability, Impaired UE functional use, Improper body mechanics, Pain, Decreased mobility  Visit Diagnosis: Acute pain of right  shoulder  Stiffness of right shoulder, not elsewhere classified  Muscle weakness (generalized)     Problem List Patient Active Problem List   Diagnosis Date Noted   Adhesive capsulitis of right shoulder    Complete tear of right rotator cuff    Degenerative superior labral anterior-to-posterior (SLAP) tear of right shoulder    Healthcare maintenance 07/31/2021   Congenital pes cavus 02/25/2021   History of uterine fibroid 01/05/2021   Herpes 07/12/2020   GERD (gastroesophageal reflux disease) 05/23/2020   Vaginal atrophy 05/23/2020   Cervical spine arthritis 05/23/2020   Osteopenia 05/23/2020   Insomnia 05/20/2020   Postmenopausal hormone therapy 05/20/2020    Particia Lather, PT 11/06/2021, [place] [address] [place], [place], [address] Phone: [phone]   Fax:  [phone]  Name: [name] MRN: [id number] Date of Birth: 1961/04/27

## 2021-11-06 ENCOUNTER — Ambulatory Visit (INDEPENDENT_AMBULATORY_CARE_PROVIDER_SITE_OTHER): Payer: 59 | Admitting: Orthopedic Surgery

## 2021-11-06 ENCOUNTER — Encounter: Payer: Self-pay | Admitting: Orthopedic Surgery

## 2021-11-06 DIAGNOSIS — M75121 Complete rotator cuff tear or rupture of right shoulder, not specified as traumatic: Secondary | ICD-10-CM

## 2021-11-07 ENCOUNTER — Other Ambulatory Visit: Payer: Self-pay

## 2021-11-07 ENCOUNTER — Ambulatory Visit: Payer: 59

## 2021-11-07 DIAGNOSIS — M25511 Pain in right shoulder: Secondary | ICD-10-CM | POA: Diagnosis not present

## 2021-11-07 DIAGNOSIS — M25611 Stiffness of right shoulder, not elsewhere classified: Secondary | ICD-10-CM

## 2021-11-07 DIAGNOSIS — M6281 Muscle weakness (generalized): Secondary | ICD-10-CM

## 2021-11-07 DIAGNOSIS — S46011D Strain of muscle(s) and tendon(s) of the rotator cuff of right shoulder, subsequent encounter: Secondary | ICD-10-CM | POA: Diagnosis not present

## 2021-11-07 NOTE — Therapy (Signed)
[place] MAIN [facility] SERVICES [address] [place], [place], [address] Phone: [phone]   Fax:  [phone]  Physical Therapy Treatment  Patient Details  Name: [name] MRN: [id number] Date of Birth: 03-28-1961 No data recorded  Encounter Date: 11/07/2021   PT End of Session - 11/07/21 1120     Visit Number 9    Number of Visits 13    Date for PT Re-Evaluation 11/15/21    PT Start Time 1001    PT Stop Time 1043    PT Time Calculation (min) 42 min    Activity Tolerance Patient limited by pain;Patient tolerated treatment well    Behavior During Therapy [facility] for tasks assessed/performed             Past Medical History:  Diagnosis Date   Complication of anesthesia    PONV (postoperative nausea and vomiting)     Past Surgical History:  Procedure Laterality Date   AUGMENTATION MAMMAPLASTY Bilateral 2010   BICEPT TENODESIS Right 09/24/2021   Procedure: BICEPS TENODESIS;  Surgeon: [name], MD;  Location: [place];  Service: Orthopedics;  Laterality: Right;   CERVICAL SPINE SURGERY  2003   C5-C7- double dissectomy   PAROTID GLAND TUMOR EXCISION     SHOULDER ARTHROSCOPY Right 09/24/2021   Procedure: right shoulder arthroscopy, debridement,open rotator cuff tear repair;  Surgeon: [name], MD;  Location: [place];  Service: Orthopedics;  Laterality: Right;   WISDOM TOOTH EXTRACTION Bilateral     There were no vitals filed for this visit.   Subjective Assessment - 11/07/21 0959     Subjective Pt reports she had MD appointment yesterday to follow-up regarding progress with shoulder. Pt reports she has been instructed to not perform any strengthening until she has completed six weeks of PT. She does report she has been instructed to start AAROM pulley exercises and has ordered one for her home.  Pt reports no pain currently, but that she still is having trouble finding a comfortable position to sleep in. She reports  she has been experiencing N/T in her L UE and thinks this is possibly due to overuse as she cannot use her RUE.    Pertinent History Pt is a 60 y/o female s/p right shoulder arthroscopy with superior labral debridement, biceps tendon release, subacromial decompression, mini open rotator cuff tear repair and biceps tenodesis - surgery preformed on 09/24/21. She had a follow-up appt with Dr. [name] on 10/26 - sling was removed this date. MD encourages wearing sling at night, otherwise encourages leaving sling off during the day. Shoulder injury occured in March 2022 - she was using a scooter due to an ankle injury. She fell off of the scooter and onto her right shoulder. 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 Of note, pt also reports C5-C7 anterior fusion in 2003.    Limitations Writing;House hold activities;Lifting;Other (comment);Walking;Standing    How long can you sit comfortably? WFL    How long can you stand comfortably? WFL w/ RUE supported    How long can you walk comfortably? 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 No/denies    Pain Onset 1 to 4 weeks ago           INTERVENTIONS -  In modified supine position at 45 degrees with towel under R shoulder and heat pad placed over RUE:  -PROM and AAROM R shoulder flexion x multiple reps, with 10 reps held at end range for 20-30 sec bouts. -PROM and AAROM R shoulder abduction x multiple reps with 10 reps held at end range for 20-30 sec bouts -PROM ER in 35 degrees shoulder abduction x multiple reps, 10 reps held for 20-30 sec bouts (<20 deg. ER, pain limited) -PROM bicep extension and flexion 20x - STM applied to R bicep/trip and R shoulder musculature x 6 minutes. Pt reports TTP along R bicep.   UE ranger/AAROM R shoulder flexion/abduction, arc motion within pain free ROM x 5  minutes.   Seated scapular squeezes 10x  MMT of LUE: grossly 5/5 on L. Pt does report increased sensation of tightness across L pectorals when in L shoulder abduction position. AROM appears WFL.   AAROM shoulder pulley (PT still supporting at pt R elbow as pt learning technique) - 2x10 R shoulder flexion, pt achieves just below 90 deg.   Pt seated with elastogel placed to R shoulder musculature x 5 min (unbilled)   Pt educated throughout session about proper posture and technique with exercises. Improved exercise technique, movement at target joints, use of target muscles after min to mod verbal, visual, tactile cues.   PT Short Term Goals - 10/18/21 1218       PT SHORT TERM GOAL #1   Title Pt will be independent with HEP in order to improve strength and decrease pain in order to improve pain-free function at home and work.    Time 2    Period Weeks    Status New    Target Date 11/01/21               PT Long Term Goals - 10/18/21 1218       PT LONG TERM GOAL #1   Title Patient will improve right shoulder PROM to > 140 degrees of flexion, scaption, and abduction for improved ability to perform overhead activities.    Baseline 10/18/21: flexion 41*, abd 60*    Time 4    Period Weeks    Status New    Target Date 11/15/21      PT LONG TERM GOAL #2   Title Patient will improve right shoulder external rotation in order to perform hygiene/ADL's such as hair care pain-free.    Baseline 10/18/21: ER 0*    Time 4    Period Weeks    Status New    Target Date 11/15/21      PT LONG TERM GOAL #3   Title Pt will decrease quick DASH score by at least 8% in order to demonstrate clinically significant reduction in disability.    Baseline 10/18/21: 80%    Time 4    Period Weeks    Status New    Target Date 11/15/21      PT LONG TERM GOAL #4   Title Pt will decrease worst pain as reported on NPRS by at least 3 points in order to demonstrate clinically significant reduction in  pain    Baseline 10/18/21: 10/10 pain    Time 4    Period Weeks    Status New    Target Date 11/15/21      PT LONG TERM GOAL #5   Title Patient will increase FOTO score to equal to or greater than 55 to demonstrate statistically significant improvement in mobility and  quality of life.    Baseline 10/18/21: 20%    Time 4    Period Weeks    Status New    Target Date 11/15/21                   Plan - 11/07/21 1125     Clinical Impression Statement Pt highly motivated to participate in session. Pt still most limited by pain with R shoulder ER. However, pt overall demonstrates progress as she reports 0/10 pain at rest. With palpation assessment pt only TTP along R bicep. Pt was able to initiate AAROM pulley exercises on this date as she reports approval to do so. PT did assess L shoulder strength and motion as pt reports onset of N/T that bothers her on L side she believes is due to overuse. Pt did report sensation of increased tightness across pec musculature on L side when in shoulder abduction. The pt will benefit from further skilled PT to continue to improve shoulder ROM, strength and overall function.    Personal Factors and Comorbidities Age;Time since onset of injury/illness/exacerbation    Examination-Activity Limitations Bathing;Reach Overhead;Dressing;Hygiene/Grooming;Toileting;Lift;Sleep;Carry    Examination-Participation Restrictions Driving;Cleaning;Meal Prep;Community Activity;Laundry;Occupation;Shop    Stability/Clinical Decision Making Stable/Uncomplicated    Rehab Potential Good    PT Frequency 3x / week    PT Duration 4 weeks    PT Treatment/Interventions ADLs/Self Care Home Management;Aquatic Therapy;Biofeedback;Electrical Stimulation;Cryotherapy;Iontophoresis 4mg /ml Dexamethasone;Moist Heat;Traction;Ultrasound;DME Instruction;Functional mobility training;Therapeutic activities;Therapeutic exercise;Neuromuscular re-education;Patient/family education;Manual  techniques;Scar mobilization;Passive range of motion;Dry needling;Other (comment);Joint Manipulations    PT Next Visit Plan PROM, AAROM, joint mobs - begin session with heat, continue POC as previously indicated, pt has been okayed to use pulley for AAROM    PT Home Exercise Plan RUE AAROM - flexion, abduction, ER (using PVC) - no printout; no updates    Consulted and Agree with Plan of Care Patient             Patient will benefit from skilled therapeutic intervention in order to improve the following deficits and impairments:  Decreased activity tolerance, Decreased range of motion, Hypomobility, Impaired perceived functional ability, Impaired UE functional use, Improper body mechanics, Pain, Decreased mobility  Visit Diagnosis: Stiffness of right shoulder, not elsewhere classified  Muscle weakness (generalized)  Acute pain of right shoulder     Problem List Patient Active Problem List   Diagnosis Date Noted   Adhesive capsulitis of right shoulder    Complete tear of right rotator cuff    Degenerative superior labral anterior-to-posterior (SLAP) tear of right shoulder    Healthcare maintenance 07/31/2021   Congenital pes cavus 02/25/2021   History of uterine fibroid 01/05/2021   Herpes 07/12/2020   GERD (gastroesophageal reflux disease) 05/23/2020   Vaginal atrophy 05/23/2020   Cervical spine arthritis 05/23/2020   Osteopenia 05/23/2020   Insomnia 05/20/2020   Postmenopausal hormone therapy 05/20/2020    Zollie Pee, PT 11/07/2021, 11:35 AM  [place] MAIN [facility] SERVICES [address] [place], [place], [address] Phone: [phone]   Fax:  [phone]  Name: [name] MRN: [id number] Date of Birth: 02/01/1961

## 2021-11-08 ENCOUNTER — Encounter: Payer: Self-pay | Admitting: Orthopedic Surgery

## 2021-11-08 ENCOUNTER — Ambulatory Visit: Payer: 59

## 2021-11-08 DIAGNOSIS — S46011D Strain of muscle(s) and tendon(s) of the rotator cuff of right shoulder, subsequent encounter: Secondary | ICD-10-CM | POA: Diagnosis not present

## 2021-11-08 DIAGNOSIS — M25511 Pain in right shoulder: Secondary | ICD-10-CM | POA: Diagnosis not present

## 2021-11-08 DIAGNOSIS — M25611 Stiffness of right shoulder, not elsewhere classified: Secondary | ICD-10-CM

## 2021-11-08 DIAGNOSIS — M6281 Muscle weakness (generalized): Secondary | ICD-10-CM

## 2021-11-08 NOTE — Progress Notes (Signed)
Post-Op Visit Note   Patient: [name]           Date of Birth: 12/20/1961           MRN: [id number] Visit Date: 11/06/2021 PCP: Venita Lick, NP   Assessment & Plan:  Chief Complaint:  Chief Complaint  Patient presents with   Right Shoulder - Follow-up   Visit Diagnoses:  1. Complete tear of right rotator cuff, unspecified whether traumatic     Plan: Patient presents now 6 weeks out right shoulder rotator cuff tear repair.  Taking tramadol Robaxin gabapentin and Celebrex.  She is on CPM and reaches 90 degrees at times.  Hard for her to sleep and she is still in a recliner.  She did have an early frozen shoulder at the time of her surgery.  I think that is causing her to be somewhat delayed in terms of regaining her motion.  On exam she has passive range of motion of 15/85/95.  Plan at this time is for her to get a door pulley to work on her forward flexion.  Continue PT for active assisted range of motion really focusing on manual PT to work on motion.  Follow-up in 4 weeks.  Follow-Up Instructions: Return in about 4 weeks (around 12/04/2021).   Orders:  No orders of the defined types were placed in this encounter.  No orders of the defined types were placed in this encounter.   Imaging: No results found.  PMFS History: Patient Active Problem List   Diagnosis Date Noted   Adhesive capsulitis of right shoulder    Complete tear of right rotator cuff    Degenerative superior labral anterior-to-posterior (SLAP) tear of right shoulder    Healthcare maintenance 07/31/2021   Congenital pes cavus 02/25/2021   History of uterine fibroid 01/05/2021   Herpes 07/12/2020   GERD (gastroesophageal reflux disease) 05/23/2020   Vaginal atrophy 05/23/2020   Cervical spine arthritis 05/23/2020   Osteopenia 05/23/2020   Insomnia 05/20/2020   Postmenopausal hormone therapy 05/20/2020   Past Medical History:  Diagnosis Date   Complication of anesthesia    PONV  (postoperative nausea and vomiting)     Family History  Problem Relation Age of Onset   Lung cancer Mother    Thyroid disease Mother    Hypertension Mother    Hyperlipidemia Mother    CAD Mother        stent x 1   Osteoporosis Mother    Leukemia Father 32   Rheum arthritis Sister    Melanoma Sister    Osteopenia Sister    Autoimmune disease Daughter    Colon cancer Maternal Uncle    Heart disease Maternal Grandmother    Colon cancer Maternal Grandfather    Heart disease Maternal Grandfather    Stroke Maternal Grandfather    Heart disease Paternal Grandfather     Past Surgical History:  Procedure Laterality Date   AUGMENTATION MAMMAPLASTY Bilateral 2010   BICEPT TENODESIS Right 09/24/2021   Procedure: BICEPS TENODESIS;  Surgeon: [name], MD;  Location: [place];  Service: Orthopedics;  Laterality: Right;   CERVICAL SPINE SURGERY  2003   C5-C7- double dissectomy   PAROTID GLAND TUMOR EXCISION     SHOULDER ARTHROSCOPY Right 09/24/2021   Procedure: right shoulder arthroscopy, debridement,open rotator cuff tear repair;  Surgeon: [name], MD;  Location: [place];  Service: Orthopedics;  Laterality: Right;   WISDOM TOOTH EXTRACTION Bilateral    Social History  Occupational History   Not on file  Tobacco Use   Smoking status: Never   Smokeless tobacco: Never  Vaping Use   Vaping Use: Never used  Substance and Sexual Activity   Alcohol use: Yes    Comment: ocassional   Drug use: Never   Sexual activity: Yes

## 2021-11-08 NOTE — Therapy (Signed)
[place] MAIN [facility] SERVICES [address] [place], [place], [address] Phone: [phone]   Fax:  [phone]  Physical Therapy Treatment/Physical Therapy Progress Note   Dates of reporting period  10/18/2021   to   11/08/2021   Patient Details  Name: [name] MRN: [id number] Date of Birth: 19-Nov-1961 No data recorded  Encounter Date: 11/08/2021   PT End of Session - 11/08/21 1227     Visit Number 10    Number of Visits 13    Date for PT Re-Evaluation 11/15/21    PT Start Time 0846    PT Stop Time 0930    PT Time Calculation (min) 44 min    Activity Tolerance Patient limited by pain;Patient tolerated treatment well    Behavior During Therapy [facility] for tasks assessed/performed             Past Medical History:  Diagnosis Date   Complication of anesthesia    PONV (postoperative nausea and vomiting)     Past Surgical History:  Procedure Laterality Date   AUGMENTATION MAMMAPLASTY Bilateral 2010   BICEPT TENODESIS Right 09/24/2021   Procedure: BICEPS TENODESIS;  Surgeon: [name], MD;  Location: [place];  Service: Orthopedics;  Laterality: Right;   CERVICAL SPINE SURGERY  2003   C5-C7- double dissectomy   PAROTID GLAND TUMOR EXCISION     SHOULDER ARTHROSCOPY Right 09/24/2021   Procedure: right shoulder arthroscopy, debridement,open rotator cuff tear repair;  Surgeon: [name], MD;  Location: [place];  Service: Orthopedics;  Laterality: Right;   WISDOM TOOTH EXTRACTION Bilateral     There were no vitals filed for this visit.  INTERVENTIONS - Goals reassessed for progress note (see goal section)  FOTO: 32  ROM assessment (PROM) RUE: prior to interventions Flexion: 96 deg. Abduction: 81 deg. Scaption: 104 det.  ER: 1 deg. Comments: pt reports pain with end range motion  Pt seated, PT provides STM to R shoulder musculature and along R bicep and Tricep with addition of TrP release - x 9 minutes. Pt  continues to be TTP throughout, particularly in R ant. Delt.   PROM and AAROM of R shoulder flexion, abduction, scaption and ER- each performed for several minutes within pain-free or pain minimal ranges. Pt monitored for response throughout. Hot pack placed to R shoulder throughout. Pt reports heat feels good with no adverse reaction to treatment.    PROM of R shoulder ER following interventions:14 deg.    QUICK DASH: 70.45% (achieved)  Worst pain in RUE: 6-7/10 (achieved)  Seated AAROM pulley exercise 12x for R shoulder flexion, performed within pain-free range   LUE nerve floss 10x; instructed in as pt continues to report N/T on L side, and added to HEP.   Pt instructed to perform on LUE only. Pt verbalized understanding and demonstrated correct technique: Access Code: [email or web address] URL: [email or web address] Date: 11/08/2021 Prepared by: Ricard Dillon  Exercises Standing Median Nerve Glide - 1 x daily - 7 x weekly - 3 sets - 10 reps - 2 hold  Seated, AAROM/table support R shoulder ER within pain free range 10x  Elastogel placed to R shoulder x 5 min at end of session for pain modulation. No adverse reaction to treatment.     Pt educated throughout session about proper posture and technique with exercises. Improved exercise technique, movement at target joints, use of target muscles after min to mod verbal, visual, tactile cues.  Patient's condition has the potential to improve in  response to therapy. Maximum improvement is yet to be obtained. The anticipated improvement is attainable and reasonable in a generally predictable time.  Patient reports     PT Short Term Goals - 11/08/21 1229       PT SHORT TERM GOAL #1   Title Pt will be independent with HEP in order to improve strength and decrease pain in order to improve pain-free function at home and work.    Baseline 11/18: pt reports compliance with HEP, HEP to be further advanced as per protocol    Time 2     Period Weeks    Status On-going    Target Date 11/22/21               PT Long Term Goals - 11/08/21 1229       PT LONG TERM GOAL #1   Title Patient will improve right shoulder PROM to > 140 degrees of flexion, scaption, and abduction for improved ability to perform overhead activities.    Baseline 10/18/21: flexion 41*, abd 60*; 11/18: flexoin 96 deg, abduction 81 deg, scaption 104 deg, ER 14 deg (following interventions)    Time 4    Period Weeks    Status On-going    Target Date 11/15/21      PT LONG TERM GOAL #2   Title Patient will improve right shoulder external rotation in order to perform hygiene/ADL's such as hair care pain-free.    Baseline 10/18/21: ER 0*; 11/18: ER 14 deg, pain limited    Time 4    Period Weeks    Status On-going    Target Date 11/15/21      PT LONG TERM GOAL #3   Title Pt will decrease quick DASH score by at least 8% in order to demonstrate clinically significant reduction in disability.    Baseline 10/18/21: 80% 11/18: 70.45%    Time 4    Period Weeks    Status Achieved      PT LONG TERM GOAL #4   Title Pt will decrease worst pain as reported on NPRS by at least 3 points in order to demonstrate clinically significant reduction in pain    Baseline 10/18/21: 10/10 pain; 11/18: 6-7/10    Time 4    Period Weeks    Status Achieved      PT LONG TERM GOAL #5   Title Patient will increase FOTO score to equal to or greater than 55 to demonstrate statistically significant improvement in mobility and quality of life.    Baseline 10/18/21: 20%; 11/18: 32%    Time 4    Period Weeks    Status On-going    Target Date 11/15/21                   Plan - 11/08/21 1238     Clinical Impression Statement Goals reassessed for progress note. Pt making gains AEB achieving Quick Dash goal (70.45%) and NPRs goal (worst pain 6-7/10). Pt also exhibits continued improvements with RUE ROM. While pt shows progress, she is still most limited with R  shoulder ER, achieving 14 degrees on this date. Patient's condition has the potential to improve in response to therapy. Maximum improvement is yet to be obtained. The anticipated improvement is attainable and reasonable in a generally predictable time. The pt will benefit from further skilled PT to continue to improve R shoulder ROM, strength and overall function to return to PLOF.    Personal Factors and Comorbidities Age;Time since onset  of injury/illness/exacerbation    Examination-Activity Limitations Bathing;Reach Overhead;Dressing;Hygiene/Grooming;Toileting;Lift;Sleep;Carry    Examination-Participation Restrictions Driving;Cleaning;Meal Prep;Community Activity;Laundry;Occupation;Shop    Stability/Clinical Decision Making Stable/Uncomplicated    Rehab Potential Good    PT Frequency 3x / week    PT Duration 4 weeks    PT Treatment/Interventions ADLs/Self Care Home Management;Aquatic Therapy;Biofeedback;Electrical Stimulation;Cryotherapy;Iontophoresis 4mg /ml Dexamethasone;Moist Heat;Traction;Ultrasound;DME Instruction;Functional mobility training;Therapeutic activities;Therapeutic exercise;Neuromuscular re-education;Patient/family education;Manual techniques;Scar mobilization;Passive range of motion;Dry needling;Other (comment);Joint Manipulations    PT Next Visit Plan PROM, AAROM, joint mobs - begin session with heat, continue POC as previously indicated, pt has been okayed to use pulley for [facility]    PT Home Exercise Plan RUE AAROM - flexion, abduction, ER (using PVC) - no printout; no updates; 11/18 to be performed LUE only Access Code: [email or web address]    Consulted and Agree with Plan of Care Patient             Patient will benefit from skilled therapeutic intervention in order to improve the following deficits and impairments:  Decreased activity tolerance, Decreased range of motion, Hypomobility, Impaired perceived functional ability, Impaired UE functional use, Improper body mechanics, Pain,  Decreased mobility  Visit Diagnosis: Acute pain of right shoulder  Muscle weakness (generalized)  Stiffness of right shoulder, not elsewhere classified     Problem List Patient Active Problem List   Diagnosis Date Noted   Adhesive capsulitis of right shoulder    Complete tear of right rotator cuff    Degenerative superior labral anterior-to-posterior (SLAP) tear of right shoulder    Healthcare maintenance 07/31/2021   Congenital pes cavus 02/25/2021   History of uterine fibroid 01/05/2021   Herpes 07/12/2020   GERD (gastroesophageal reflux disease) 05/23/2020   Vaginal atrophy 05/23/2020   Cervical spine arthritis 05/23/2020   Osteopenia 05/23/2020   Insomnia 05/20/2020   Postmenopausal hormone therapy 05/20/2020    Zollie Pee, PT 11/08/2021, 12:42 PM  [place] MAIN [facility] SERVICES [address] [place], [place], [address] Phone: [phone]   Fax:  [phone]  Name: [name] MRN: [id number] Date of Birth: [birth year]

## 2021-11-12 ENCOUNTER — Other Ambulatory Visit: Payer: Self-pay

## 2021-11-12 ENCOUNTER — Ambulatory Visit: Payer: 59

## 2021-11-12 DIAGNOSIS — M6281 Muscle weakness (generalized): Secondary | ICD-10-CM

## 2021-11-12 DIAGNOSIS — M25511 Pain in right shoulder: Secondary | ICD-10-CM

## 2021-11-12 DIAGNOSIS — M25611 Stiffness of right shoulder, not elsewhere classified: Secondary | ICD-10-CM | POA: Diagnosis not present

## 2021-11-12 DIAGNOSIS — S46011D Strain of muscle(s) and tendon(s) of the rotator cuff of right shoulder, subsequent encounter: Secondary | ICD-10-CM | POA: Diagnosis not present

## 2021-11-12 NOTE — Therapy (Signed)
[place] MAIN [facility] SERVICES [address] [place], [place], [address] Phone: [phone]   Fax:  [phone]  Physical Therapy Treatment  Patient Details  Name: [name] MRN: [id number] Date of Birth: [birth year] No data recorded  Encounter Date: 11/12/2021   PT End of Session - 11/12/21 1234     Visit Number 11    Number of Visits 13    Date for PT Re-Evaluation 11/15/21    PT Start Time 1005    PT Stop Time 9675    PT Time Calculation (min) 39 min    Activity Tolerance Patient limited by pain;Patient tolerated treatment well    Behavior During Therapy [facility] for tasks assessed/performed             Past Medical History:  Diagnosis Date   Complication of anesthesia    PONV (postoperative nausea and vomiting)     Past Surgical History:  Procedure Laterality Date   AUGMENTATION MAMMAPLASTY Bilateral 2010   BICEPT TENODESIS Right 09/24/2021   Procedure: BICEPS TENODESIS;  Surgeon: [name], MD;  Location: [place];  Service: Orthopedics;  Laterality: Right;   CERVICAL SPINE SURGERY  2003   C5-C7- double dissectomy   PAROTID GLAND TUMOR EXCISION     SHOULDER ARTHROSCOPY Right 09/24/2021   Procedure: right shoulder arthroscopy, debridement,open rotator cuff tear repair;  Surgeon: [name], MD;  Location: [place];  Service: Orthopedics;  Laterality: Right;   WISDOM TOOTH EXTRACTION Bilateral     There were no vitals filed for this visit.    Subjective Assessment - 11/12/21 1235     Subjective Pt reports R shoulder pain is currently 3/10. Pt has ordered a wedge to use for sleep. She reports she has been performing HEP for L shoulder as well as R shoulder and has experienced an improvement in LUE symptoms.    Pertinent History Pt is a 60 y/o female s/p right shoulder arthroscopy with superior labral debridement, biceps tendon release, subacromial decompression, mini open rotator cuff tear repair and biceps  tenodesis - surgery preformed on 09/24/21. She had a follow-up appt with Dr. [name] on 10/26 - sling was removed this date. MD encourages wearing sling at night, otherwise encourages leaving sling off during the day. Shoulder injury occured in March 2022 - she was using a scooter due to an ankle injury. She fell off of the scooter and onto her right shoulder. 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 Of note, pt also reports C5-C7 anterior fusion in 2003.    Limitations Writing;House hold activities;Lifting;Other (comment);Walking;Standing    How long can you sit comfortably? WFL    How long can you stand comfortably? WFL w/ RUE supported    How long can you walk comfortably? 30 minutes w/o sling    Patient Stated Goals full ROM, eventually return to work Investment banker, corporate at [employer]), be able to use arm w/o pain    Pain Score 3     Pain Location Shoulder    Pain Orientation Right    Pain Onset 1 to 4 weeks ago             INTERVENTIONS -   In modified supine position at 45 degrees with towel under R shoulder and heat pad placed over RUE:  -PROM and AAROM R shoulder flexion x multiple reps, with 5 reps held at end range for 20-30 sec bouts within pain tolerance. -  PROM and AAROM R shoulder abduction x multiple reps with 8 reps held at end range for 20-30 sec bouts within pain tolerance -PROM and AAROM ER in 35 degrees shoulder abduction x multiple reps, 10 reps held for 20-30 sec bouts within pain tolerance (pt able to achieve approx 20+ deg today) - STM applied to R bicep/trip, R ant. delt and R  posterior shoulder musculature x 8 minutes. Pt continues to be TTP along R bicep.  Comments: Pt reports heat feels good over R shoulder musculature. No adverse reaction to treatment. Pt able to improve R shoulder ER ROM this session.  Theraputty (green) for R grip x 2 min  UE ranger/AAROM R shoulder flexion/abduction, arc motion (into horizontal adduction to  tolerance) within pain free ROM x multiple reps. Pt performs with elasto-gel placed to R shoulder (no adverse reaction to treatment).  Pt discontinues after several reps due to RUE noticeably tremulous, pt repots RUE fatigue.  UE notably tremulous, pt reports fatigue    Seated scapular squeezes 10x  Pt educated throughout session about proper posture and technique with exercises. Improved exercise technique, movement at target joints, use of target muscles after min to mod verbal, visual, tactile cues.       PT Education - 11/12/21 1233     Education Details exercise technique    Person(s) Educated Patient    Methods Explanation;Demonstration;Tactile cues;Verbal cues    Comprehension Verbalized understanding;Returned demonstration;Need further instruction              PT Short Term Goals - 11/08/21 1229       PT SHORT TERM GOAL #1   Title Pt will be independent with HEP in order to improve strength and decrease pain in order to improve pain-free function at home and work.    Baseline 11/18: pt reports compliance with HEP, HEP to be further advanced as per protocol    Time 2    Period Weeks    Status On-going    Target Date 11/22/21               PT Long Term Goals - 11/08/21 1229       PT LONG TERM GOAL #1   Title Patient will improve right shoulder PROM to > 140 degrees of flexion, scaption, and abduction for improved ability to perform overhead activities.    Baseline 10/18/21: flexion 41*, abd 60*; 11/18: flexoin 96 deg, abduction 81 deg, scaption 104 deg, ER 14 deg (following interventions)    Time 4    Period Weeks    Status On-going    Target Date 11/15/21      PT LONG TERM GOAL #2   Title Patient will improve right shoulder external rotation in order to perform hygiene/ADL's such as hair care pain-free.    Baseline 10/18/21: ER 0*; 11/18: ER 14 deg, pain limited    Time 4    Period Weeks    Status On-going    Target Date 11/15/21      PT LONG TERM  GOAL #3   Title Pt will decrease quick DASH score by at least 8% in order to demonstrate clinically significant reduction in disability.    Baseline 10/18/21: 80% 11/18: 70.45%    Time 4    Period Weeks    Status Achieved      PT LONG TERM GOAL #4   Title Pt will decrease worst pain as reported on NPRS by at least 3 points in order to demonstrate clinically  significant reduction in pain    Baseline 10/18/21: 10/10 pain; 11/18: 6-7/10    Time 4    Period Weeks    Status Achieved      PT LONG TERM GOAL #5   Title Patient will increase FOTO score to equal to or greater than 55 to demonstrate statistically significant improvement in mobility and quality of life.    Baseline 10/18/21: 20%; 11/18: 32%    Time 4    Period Weeks    Status On-going    Target Date 11/15/21                   Plan - 11/12/21 1236     Clinical Impression Statement Pt exhibits improved R shoulder ER this session compared to previous session. Pt initiated green theraputty intervention on this date for grip strength. The pt tolerates all interventions well with reports of minimal increases in pain. The pt will benefit from further skilled PT to continue to improve R shoulder ROM, strength and mobility.    Personal Factors and Comorbidities Age;Time since onset of injury/illness/exacerbation    Examination-Activity Limitations Bathing;Reach Overhead;Dressing;Hygiene/Grooming;Toileting;Lift;Sleep;Carry    Examination-Participation Restrictions Driving;Cleaning;Meal Prep;Community Activity;Laundry;Occupation;Shop    Stability/Clinical Decision Making Stable/Uncomplicated    Rehab Potential Good    PT Frequency 3x / week    PT Duration 4 weeks    PT Treatment/Interventions ADLs/Self Care Home Management;Aquatic Therapy;Biofeedback;Electrical Stimulation;Cryotherapy;Iontophoresis 4mg /ml Dexamethasone;Moist Heat;Traction;Ultrasound;DME Instruction;Functional mobility training;Therapeutic activities;Therapeutic  exercise;Neuromuscular re-education;Patient/family education;Manual techniques;Scar mobilization;Passive range of motion;Dry needling;Other (comment);Joint Manipulations    PT Next Visit Plan PROM, AAROM, joint mobs - begin session with heat, continue POC as previously indicated, pt has been okayed to use pulley for [facility]    PT Home Exercise Plan RUE AAROM - flexion, abduction, ER (using PVC) - no printout; no updates; 11/18 to be performed LUE only Access Code: [email or web address]; 11/22: green theraputty for R grip strength x2 min    Consulted and Agree with Plan of Care Patient             Patient will benefit from skilled therapeutic intervention in order to improve the following deficits and impairments:  Decreased activity tolerance, Decreased range of motion, Hypomobility, Impaired perceived functional ability, Impaired UE functional use, Improper body mechanics, Pain, Decreased mobility  Visit Diagnosis: Stiffness of right shoulder, not elsewhere classified  Muscle weakness (generalized)  Acute pain of right shoulder     Problem List Patient Active Problem List   Diagnosis Date Noted   Adhesive capsulitis of right shoulder    Complete tear of right rotator cuff    Degenerative superior labral anterior-to-posterior (SLAP) tear of right shoulder    Healthcare maintenance 07/31/2021   Congenital pes cavus 02/25/2021   History of uterine fibroid 01/05/2021   Herpes 07/12/2020   GERD (gastroesophageal reflux disease) 05/23/2020   Vaginal atrophy 05/23/2020   Cervical spine arthritis 05/23/2020   Osteopenia 05/23/2020   Insomnia 05/20/2020   Postmenopausal hormone therapy 05/20/2020    Zollie Pee, PT 11/12/2021, 12:47 PM  [place] MAIN [facility] SERVICES [address] [place], [place], [address] Phone: [phone]   Fax:  [phone]  Name: [name] MRN: [id number] Date of Birth: August 30, 1961

## 2021-11-13 ENCOUNTER — Ambulatory Visit: Payer: 59

## 2021-11-13 DIAGNOSIS — M25511 Pain in right shoulder: Secondary | ICD-10-CM | POA: Diagnosis not present

## 2021-11-13 DIAGNOSIS — M6281 Muscle weakness (generalized): Secondary | ICD-10-CM

## 2021-11-13 DIAGNOSIS — M25611 Stiffness of right shoulder, not elsewhere classified: Secondary | ICD-10-CM | POA: Diagnosis not present

## 2021-11-13 DIAGNOSIS — S46011D Strain of muscle(s) and tendon(s) of the rotator cuff of right shoulder, subsequent encounter: Secondary | ICD-10-CM | POA: Diagnosis not present

## 2021-11-13 NOTE — Therapy (Signed)
[place] MAIN [facility] SERVICES [address] [place], [place], [address] Phone: [phone]   Fax:  [phone]  Physical Therapy Treatment/RECERT  Patient Details  Name: [name] MRN: [id number] Date of Birth: [birth year] No data recorded  Encounter Date: 11/13/2021   PT End of Session - 11/13/21 1022     Visit Number 12    Number of Visits 20    Date for PT Re-Evaluation 12/11/21    PT Start Time 0930    PT Stop Time 7741    PT Time Calculation (min) 44 min    Activity Tolerance Patient limited by pain;Patient tolerated treatment well    Behavior During Therapy [facility] for tasks assessed/performed             Past Medical History:  Diagnosis Date   Complication of anesthesia    PONV (postoperative nausea and vomiting)     Past Surgical History:  Procedure Laterality Date   AUGMENTATION MAMMAPLASTY Bilateral 2010   BICEPT TENODESIS Right 09/24/2021   Procedure: BICEPS TENODESIS;  Surgeon: [name], MD;  Location: [place];  Service: Orthopedics;  Laterality: Right;   CERVICAL SPINE SURGERY  2003   C5-C7- double dissectomy   PAROTID GLAND TUMOR EXCISION     SHOULDER ARTHROSCOPY Right 09/24/2021   Procedure: right shoulder arthroscopy, debridement,open rotator cuff tear repair;  Surgeon: [name], MD;  Location: [place];  Service: Orthopedics;  Laterality: Right;   WISDOM TOOTH EXTRACTION Bilateral     There were no vitals filed for this visit.   Subjective Assessment - 11/13/21 1020     Subjective Patient reports she will be going to [place] in december for a week. Wants an exercise program for her trip.    Pertinent History Pt is a 60 y/o female s/p right shoulder arthroscopy with superior labral debridement, biceps tendon release, subacromial decompression, mini open rotator cuff tear repair and biceps tenodesis - surgery preformed on 09/24/21. She had a follow-up appt with Dr. [name] on 10/26 - sling was removed  this date. MD encourages wearing sling at night, otherwise encourages leaving sling off during the day. Shoulder injury occured in March 2022 - she was using a scooter due to an ankle injury. She fell off of the scooter and onto her right shoulder. 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 Of note, pt also reports C5-C7 anterior fusion in 2003.    Limitations Writing;House hold activities;Lifting;Other (comment);Walking;Standing    How long can you sit comfortably? WFL    How long can you stand comfortably? WFL w/ RUE supported    How long can you walk comfortably? 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 Yes    Pain Score 2     Pain Location Shoulder    Pain Orientation Right    Pain Descriptors / Indicators Aching    Pain Type Acute pain;Surgical pain                   RECERT Goals performed two sessions prior on 11/08/21 please refer to this note for further details.   INTERVENTIONS -      In modified supine position at 45 degrees with towel under R shoulder and heat pad placed over RUE:  -PROM and AAROM R  shoulder flexion x multiple reps, with 10 reps held at end range for 20-30 sec bouts. -PROM and AAROM R shoulder abduction x multiple reps with 10 reps held at end range for 20-30 sec bouts -PROM ER in 35 degrees shoulder abduction x multiple reps, 10 reps held for 20-30 sec bouts (<20 deg. ER, pain limited) -PROM bicep extension and flexion 20x with gentle trigger point mobilizations with movement - STM applied to R bicep/trip and R shoulder musculature x 4 minutes. Pt reports TTP along R bicep.    UE ranger/AAROM R shoulder flexion/abduction, arc motion within pain free ROM x 12x, clockwise circles 10x, counterclockwise 10x   Seated arms on swiss ball forward flexion AAROM , lateral adduction/abduction  12x  Finger wall walk flexion 4x with increasing ROM>90 degrees    Pt seated with elastogel placed to R shoulder musculature x 4 min (unbilled)     Pt educated throughout session about proper posture and technique with exercises. Improved exercise technique, movement at target joints, use of target muscles after min to mod verbal, visual, tactile cues.   Goals performed two sessions prior on 11/08/21 please refer to this note for further details. Patient tolerated progressive AAROM on wall and swiss ball without pain increase. Increased functional range of motion in passive and active assisted performed. Will need a pool exercise program created next week for her upcoming trip. The pt will benefit from further skilled PT to continue to improve R shoulder ROM, strength and overall function to return to PLOF.                  PT Education - 11/13/21 1022     Education Details progressive ROM, progressive AAROM    Person(s) Educated Patient    Methods Explanation;Demonstration;Tactile cues;Verbal cues    Comprehension Verbalized understanding;Returned demonstration;Verbal cues required;Tactile cues required              PT Short Term Goals - 11/13/21 1025       PT SHORT TERM GOAL #1   Title Pt will be independent with HEP in order to improve strength and decrease pain in order to improve pain-free function at home and work.    Baseline 11/18: pt reports compliance with HEP, HEP to be further advanced as per protocol    Time 2    Period Weeks    Status On-going    Target Date 11/27/21               PT Long Term Goals - 11/13/21 1025       PT LONG TERM GOAL #1   Title Patient will improve right shoulder PROM to > 140 degrees of flexion, scaption, and abduction for improved ability to perform overhead activities.    Baseline 10/18/21: flexion 41*, abd 60*; 11/18: flexoin 96 deg, abduction 81 deg, scaption 104 deg, ER 14 deg (following interventions)    Time 4     Period Weeks    Status Partially Met    Target Date 12/11/21      PT LONG TERM GOAL #2   Title Patient will improve right shoulder external rotation in order to perform hygiene/ADL's such as hair care pain-free.    Baseline 10/18/21: ER 0*; 11/18: ER 14 deg, pain limited    Time 4    Period Weeks    Status Partially Met    Target Date 12/11/21      PT LONG TERM GOAL #3   Title Pt will decrease  quick DASH score by at least 8% in order to demonstrate clinically significant reduction in disability.    Baseline 10/18/21: 80% 11/18: 70.45%    Time 4    Period Weeks    Status Achieved      PT LONG TERM GOAL #4   Title Pt will decrease worst pain as reported on NPRS by at least 3 points in order to demonstrate clinically significant reduction in pain    Baseline 10/18/21: 10/10 pain; 11/18: 6-7/10    Time 4    Period Weeks    Status Achieved      PT LONG TERM GOAL #5   Title Patient will increase FOTO score to equal to or greater than 55 to demonstrate statistically significant improvement in mobility and quality of life.    Baseline 10/18/21: 20%; 11/18: 32%    Time 4    Period Weeks    Status Partially Met    Target Date 12/11/21      Additional Long Term Goals   Additional Long Term Goals Yes      PT LONG TERM GOAL #6   Title Pt will decrease quick DASH score by at least 8% in order to demonstrate clinically significant reduction in disability.    Baseline 11/18: 70%    Time 4    Period Weeks    Status New    Target Date 12/11/21                   Plan - 11/13/21 1024     Clinical Impression Statement Goals performed two sessions prior on 11/08/21 please refer to this note for further details. Patient tolerated progressive AAROM on wall and swiss ball without pain increase. Increased functional range of motion in passive and active assisted performed. Will need a pool exercise program created next week for her upcoming trip. The pt will benefit from further  skilled PT to continue to improve R shoulder ROM, strength and overall function to return to PLOF.    Personal Factors and Comorbidities Age;Time since onset of injury/illness/exacerbation    Examination-Activity Limitations Bathing;Reach Overhead;Dressing;Hygiene/Grooming;Toileting;Lift;Sleep;Carry    Examination-Participation Restrictions Driving;Cleaning;Meal Prep;Community Activity;Laundry;Occupation;Shop    Stability/Clinical Decision Making Stable/Uncomplicated    Rehab Potential Good    PT Frequency 3x / week    PT Duration 4 weeks    PT Treatment/Interventions ADLs/Self Care Home Management;Aquatic Therapy;Biofeedback;Electrical Stimulation;Cryotherapy;Iontophoresis 4mg /ml Dexamethasone;Moist Heat;Traction;Ultrasound;DME Instruction;Functional mobility training;Therapeutic activities;Therapeutic exercise;Neuromuscular re-education;Patient/family education;Manual techniques;Scar mobilization;Passive range of motion;Dry needling;Other (comment);Joint Manipulations    PT Next Visit Plan home program for water aerobics on trip    PT [place] - flexion, abduction, ER (using PVC) - no printout; no updates; 11/18 to be performed LUE only Access Code: [email or web address]; 11/22: green theraputty for R grip strength x2 min    Consulted and Agree with Plan of Care Patient             Patient will benefit from skilled therapeutic intervention in order to improve the following deficits and impairments:  Decreased activity tolerance, Decreased range of motion, Hypomobility, Impaired perceived functional ability, Impaired UE functional use, Improper body mechanics, Pain, Decreased mobility  Visit Diagnosis: Stiffness of right shoulder, not elsewhere classified  Muscle weakness (generalized)  Acute pain of right shoulder     Problem List Patient Active Problem List   Diagnosis Date Noted   Adhesive capsulitis of right shoulder    Complete tear of right rotator cuff     Degenerative superior labral  anterior-to-posterior (SLAP) tear of right shoulder    Healthcare maintenance 07/31/2021   Congenital pes cavus 02/25/2021   History of uterine fibroid 01/05/2021   Herpes 07/12/2020   GERD (gastroesophageal reflux disease) 05/23/2020   Vaginal atrophy 05/23/2020   Cervical spine arthritis 05/23/2020   Osteopenia 05/23/2020   Insomnia 05/20/2020   Postmenopausal hormone therapy 05/20/2020   Janna Arch, PT, DPT  11/13/2021, 10:28 AM  [place] MAIN [facility] SERVICES [address] [place], [place], [address] Phone: [phone]   Fax:  [phone]  Name: [name] MRN: [id number] Date of Birth: [birth year]

## 2021-11-18 ENCOUNTER — Ambulatory Visit: Payer: 59

## 2021-11-18 ENCOUNTER — Other Ambulatory Visit: Payer: Self-pay

## 2021-11-18 ENCOUNTER — Telehealth: Payer: Self-pay | Admitting: Orthopedic Surgery

## 2021-11-18 DIAGNOSIS — M6281 Muscle weakness (generalized): Secondary | ICD-10-CM

## 2021-11-18 DIAGNOSIS — M25611 Stiffness of right shoulder, not elsewhere classified: Secondary | ICD-10-CM | POA: Diagnosis not present

## 2021-11-18 DIAGNOSIS — M25511 Pain in right shoulder: Secondary | ICD-10-CM | POA: Diagnosis not present

## 2021-11-18 DIAGNOSIS — S46011D Strain of muscle(s) and tendon(s) of the rotator cuff of right shoulder, subsequent encounter: Secondary | ICD-10-CM | POA: Diagnosis not present

## 2021-11-18 NOTE — Therapy (Signed)
[place] MAIN [facility] SERVICES [address] [place], [place], [address] Phone: [phone]   Fax:  [phone]  Physical Therapy Treatment  Patient Details  Name: [name] MRN: [id number] Date of Birth: 1961-04-14 No data recorded  Encounter Date: 11/18/2021   PT End of Session - 11/18/21 0905     Visit Number 13    Number of Visits 20    Date for PT Re-Evaluation 12/11/21    PT Start Time 0800    PT Stop Time 2951    PT Time Calculation (min) 44 min    Activity Tolerance Patient limited by pain;Patient tolerated treatment well    Behavior During Therapy [facility] for tasks assessed/performed             Past Medical History:  Diagnosis Date   Complication of anesthesia    PONV (postoperative nausea and vomiting)     Past Surgical History:  Procedure Laterality Date   AUGMENTATION MAMMAPLASTY Bilateral 2010   BICEPT TENODESIS Right 09/24/2021   Procedure: BICEPS TENODESIS;  Surgeon: [name], MD;  Location: [place];  Service: Orthopedics;  Laterality: Right;   CERVICAL SPINE SURGERY  2003   C5-C7- double dissectomy   PAROTID GLAND TUMOR EXCISION     SHOULDER ARTHROSCOPY Right 09/24/2021   Procedure: right shoulder arthroscopy, debridement,open rotator cuff tear repair;  Surgeon: [name], MD;  Location: [place];  Service: Orthopedics;  Laterality: Right;   WISDOM TOOTH EXTRACTION Bilateral     There were no vitals filed for this visit.   Subjective Assessment - 11/18/21 0902     Subjective Patient leaves next week for [place] for a trip. Has her surgeon follow up in mid december. Has been having difficulty sleeping due to numbness and pain in non surgical arm.    Pertinent History Pt is a 60 y/o female s/p right shoulder arthroscopy with superior labral debridement, biceps tendon release, subacromial decompression, mini open rotator cuff tear repair and biceps tenodesis - surgery preformed on 09/24/21. She had a  follow-up appt with Dr. [name] on 10/26 - sling was removed this date. MD encourages wearing sling at night, otherwise encourages leaving sling off during the day. Shoulder injury occured in March 2022 - she was using a scooter due to an ankle injury. She fell off of the scooter and onto her right shoulder. 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 Of note, pt also reports C5-C7 anterior fusion in 2003.    Limitations Writing;House hold activities;Lifting;Other (comment);Walking;Standing    How long can you sit comfortably? WFL    How long can you stand comfortably? WFL w/ RUE supported    How long can you walk comfortably? 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 Yes    Pain Score 2     Pain Location Shoulder    Pain Orientation Right;Left    Pain Descriptors / Indicators Aching    Pain Type Acute pain;Surgical pain    Pain Onset More than a month ago    Pain Frequency Constant                  Treatment: In modified supine position at 45 degrees with towel under R shoulder and heat pad placed over RUE:  -PROM and  AAROM R shoulder flexion x multiple reps, with 10 reps held at end range for 20-30 sec bouts. -PROM and AAROM R shoulder abduction x multiple reps with 10 reps held at end range for 20-30 sec bouts -PROM ER in 35 degrees shoulder abduction x multiple reps, 10 reps held for 20-30 sec bouts (<20 deg. ER, pain limited) -PROM bicep extension and flexion 20x with gentle trigger point mobilizations with movement  Finger wall walk flexion 5x with increasing ROM>90 degrees  Finger wall walk abduction 5x with increasing ROM >90 degrees  Shoulder/trunk flexion with arms on ballet bar. Terminated due to pain in nonsurgical arm. Noticeable loss of color in finger tips.   Pt seated with elastogel placed to R shoulder  musculature x 4 min (unbilled)   HEP review for [place] in pool:    Access Code: [email or web address] URL: [email or web address] Date: 11/13/2021 Prepared by: Precious Bard  Exercises  Cat Cow in [place] with Pool Noodle - 1 x daily - 7 x weekly - 2 sets - 10 reps - 5 hold Warrior II in [employer] with [employer] - 1 x daily - 7 x weekly - 2 sets - 10 reps - 5 hold Seated Elbow Flexion Shoulder Internal Rotation AAROM at Table with Towel - 1 x daily - 7 x weekly - 2 sets - 10 reps - 5 hold Bilateral Shoulder Horizontal Abduction Adduction AROM at Pool Wall - 1 x daily - 7 x weekly - 2 sets - 10 reps - 5 hold Bilateral Shoulder Flexion Extension AROM at [employer] - 1 x daily - 7 x weekly - 2 sets - 10 reps - 5 hold       Pt educated throughout session about proper posture and technique with exercises. Improved exercise technique, movement at target joints, use of target muscles after min to mod verbal, visual, tactile cues.   Patient educated on and performed HEP for trip in [place] in addition to use of her pulleys. Patient is progressing with AAROM with increased ROM and decreased pain. External rotation is most limited at this time and will continue to be an area of progression.  Patient c/o of pain in non surgical arm and has noticeable change of color in hand with change of position indicating potential blood flow limitations and/or nerve impingement combination. Patient educated on need to contact physician about further information due to it affecting quality of life. The pt will benefit from further skilled PT to continue to improve R shoulder ROM, strength and overall function to return to PLOF.               PT Education - 11/18/21 0904     Education Details exercise technique, body mechanics, follow up with physician about pain in nonsurgical arm.    Person(s) Educated Patient    Methods Explanation;Demonstration;Tactile cues;Verbal cues     Comprehension Verbalized understanding;Returned demonstration;Verbal cues required;Tactile cues required              PT Short Term Goals - 11/13/21 1025       PT SHORT TERM GOAL #1   Title Pt will be independent with HEP in order to improve strength and decrease pain in order to improve pain-free function at home and work.    Baseline 11/18: pt reports compliance with HEP, HEP to be further advanced as per protocol    Time 2    Period Weeks    Status On-going    Target Date 11/27/21  PT Long Term Goals - 11/13/21 1025       PT LONG TERM GOAL #1   Title Patient will improve right shoulder PROM to > 140 degrees of flexion, scaption, and abduction for improved ability to perform overhead activities.    Baseline 10/18/21: flexion 41*, abd 60*; 11/18: flexoin 96 deg, abduction 81 deg, scaption 104 deg, ER 14 deg (following interventions)    Time 4    Period Weeks    Status Partially Met    Target Date 12/11/21      PT LONG TERM GOAL #2   Title Patient will improve right shoulder external rotation in order to perform hygiene/ADL's such as hair care pain-free.    Baseline 10/18/21: ER 0*; 11/18: ER 14 deg, pain limited    Time 4    Period Weeks    Status Partially Met    Target Date 12/11/21      PT LONG TERM GOAL #3   Title Pt will decrease quick DASH score by at least 8% in order to demonstrate clinically significant reduction in disability.    Baseline 10/18/21: 80% 11/18: 70.45%    Time 4    Period Weeks    Status Achieved      PT LONG TERM GOAL #4   Title Pt will decrease worst pain as reported on NPRS by at least 3 points in order to demonstrate clinically significant reduction in pain    Baseline 10/18/21: 10/10 pain; 11/18: 6-7/10    Time 4    Period Weeks    Status Achieved      PT LONG TERM GOAL #5   Title Patient will increase FOTO score to equal to or greater than 55 to demonstrate statistically significant improvement in mobility and  quality of life.    Baseline 10/18/21: 20%; 11/18: 32%    Time 4    Period Weeks    Status Partially Met    Target Date 12/11/21      Additional Long Term Goals   Additional Long Term Goals Yes      PT LONG TERM GOAL #6   Title Pt will decrease quick DASH score by at least 8% in order to demonstrate clinically significant reduction in disability.    Baseline 11/18: 70%    Time 4    Period Weeks    Status New    Target Date 12/11/21                   Plan - 11/18/21 0910     Clinical Impression Statement Patient educated on and performed HEP for trip in [place] in addition to use of her pulleys. Patient is progressing with AAROM with increased ROM and decreased pain. External rotation is most limited at this time and will continue to be an area of progression.  Patient c/o of pain in non surgical arm and has noticeable change of color in hand with change of position indicating potential blood flow limitations and/or nerve impingement combination. Patient educated on need to contact physician about further information due to it affecting quality of life. The pt will benefit from further skilled PT to continue to improve R shoulder ROM, strength and overall function to return to PLOF.    Personal Factors and Comorbidities Age;Time since onset of injury/illness/exacerbation    Examination-Activity Limitations Bathing;Reach Overhead;Dressing;Hygiene/Grooming;Toileting;Lift;Sleep;Carry    Examination-Participation Restrictions Driving;Cleaning;Meal Prep;Community Activity;Laundry;Occupation;Shop    Stability/Clinical Decision Making Stable/Uncomplicated    Rehab Potential Good    PT Frequency  3x / week    PT Duration 4 weeks    PT Treatment/Interventions ADLs/Self Care Home Management;Aquatic Therapy;Biofeedback;Electrical Stimulation;Cryotherapy;Iontophoresis 4mg /ml Dexamethasone;Moist Heat;Traction;Ultrasound;DME Instruction;Functional mobility training;Therapeutic  activities;Therapeutic exercise;Neuromuscular re-education;Patient/family education;Manual techniques;Scar mobilization;Passive range of motion;Dry needling;Other (comment);Joint Manipulations    PT Next Visit Plan home program for water aerobics on trip    PT [place] - flexion, abduction, ER (using PVC) - no printout; no updates; 11/18 to be performed LUE only Access Code: [email or web address]; 11/22: green theraputty for R grip strength x2 min    Consulted and Agree with Plan of Care Patient             Patient will benefit from skilled therapeutic intervention in order to improve the following deficits and impairments:  Decreased activity tolerance, Decreased range of motion, Hypomobility, Impaired perceived functional ability, Impaired UE functional use, Improper body mechanics, Pain, Decreased mobility  Visit Diagnosis: Stiffness of right shoulder, not elsewhere classified  Muscle weakness (generalized)  Acute pain of right shoulder     Problem List Patient Active Problem List   Diagnosis Date Noted   Adhesive capsulitis of right shoulder    Complete tear of right rotator cuff    Degenerative superior labral anterior-to-posterior (SLAP) tear of right shoulder    Healthcare maintenance 07/31/2021   Congenital pes cavus 02/25/2021   History of uterine fibroid 01/05/2021   Herpes 07/12/2020   GERD (gastroesophageal reflux disease) 05/23/2020   Vaginal atrophy 05/23/2020   Cervical spine arthritis 05/23/2020   Osteopenia 05/23/2020   Insomnia 05/20/2020   Postmenopausal hormone therapy 05/20/2020    Janna Arch, PT, DPT  11/18/2021, 9:11 AM  [place] MAIN [facility] SERVICES [address] [place], [place], [address] Phone: [phone]   Fax:  [phone]  Name: Amreen Raczkowski MRN: [id number] Date of Birth: 04/01/1961

## 2021-11-18 NOTE — Telephone Encounter (Signed)
[employer] forms received. To [employer]. ?

## 2021-11-19 ENCOUNTER — Ambulatory Visit: Payer: 59 | Admitting: Physical Therapy

## 2021-11-19 ENCOUNTER — Encounter: Payer: Self-pay | Admitting: Orthopedic Surgery

## 2021-11-19 ENCOUNTER — Other Ambulatory Visit: Payer: Self-pay

## 2021-11-19 ENCOUNTER — Other Ambulatory Visit: Payer: Self-pay | Admitting: Surgical

## 2021-11-19 DIAGNOSIS — M25511 Pain in right shoulder: Secondary | ICD-10-CM

## 2021-11-19 DIAGNOSIS — M25611 Stiffness of right shoulder, not elsewhere classified: Secondary | ICD-10-CM

## 2021-11-19 DIAGNOSIS — S46011D Strain of muscle(s) and tendon(s) of the rotator cuff of right shoulder, subsequent encounter: Secondary | ICD-10-CM

## 2021-11-19 DIAGNOSIS — M6281 Muscle weakness (generalized): Secondary | ICD-10-CM | POA: Diagnosis not present

## 2021-11-19 MED FILL — Celecoxib Cap 100 MG: ORAL | 30 days supply | Qty: 60 | Fill #0 | Status: AC

## 2021-11-19 MED FILL — Tramadol HCl Tab 50 MG: ORAL | 20 days supply | Qty: 40 | Fill #0 | Status: AC

## 2021-11-19 NOTE — Therapy (Signed)
[place] MAIN [facility] SERVICES [address] [place], [place], [address] Phone: [phone]   Fax:  [phone]  Physical Therapy Treatment  Patient Details  Name: [name] MRN: [id number] Date of Birth: 07-07-1961 No data recorded  Encounter Date: 11/19/2021   PT End of Session - 11/19/21 1229     Visit Number 14    Number of Visits 20    Date for PT Re-Evaluation 12/11/21    PT Start Time 1150    PT Stop Time 2841    PT Time Calculation (min) 44 min    Activity Tolerance Patient limited by pain;Patient tolerated treatment well    Behavior During Therapy [facility] for tasks assessed/performed             Past Medical History:  Diagnosis Date   Complication of anesthesia    PONV (postoperative nausea and vomiting)     Past Surgical History:  Procedure Laterality Date   AUGMENTATION MAMMAPLASTY Bilateral 2010   BICEPT TENODESIS Right 09/24/2021   Procedure: BICEPS TENODESIS;  Surgeon: [name], MD;  Location: [place];  Service: Orthopedics;  Laterality: Right;   CERVICAL SPINE SURGERY  2003   C5-C7- double dissectomy   PAROTID GLAND TUMOR EXCISION     SHOULDER ARTHROSCOPY Right 09/24/2021   Procedure: right shoulder arthroscopy, debridement,open rotator cuff tear repair;  Surgeon: [name], MD;  Location: [place];  Service: Orthopedics;  Laterality: Right;   WISDOM TOOTH EXTRACTION Bilateral     There were no vitals filed for this visit.   Subjective Assessment - 11/19/21 1219     Subjective Patient leaves next week for [place] for a trip. Has her surgeon follow up in mid december. No pain in the involved side at start of therapy today, min pain in the left side. Reports cont difficulty with lying supine due ot right shoulder pain.    Pertinent History Pt is a 60 y/o female s/p right shoulder arthroscopy with superior labral debridement, biceps tendon release, subacromial decompression, mini open rotator cuff tear  repair and biceps tenodesis - surgery preformed on 09/24/21. She had a follow-up appt with Dr. [name] on 10/26 - sling was removed this date. MD encourages wearing sling at night, otherwise encourages leaving sling off during the day. Shoulder injury occured in March 2022 - she was using a scooter due to an ankle injury. She fell off of the scooter and onto her right shoulder. 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 Of note, pt also reports C5-C7 anterior fusion in 2003.    Limitations Writing;House hold activities;Lifting;Other (comment);Walking;Standing    How long can you sit comfortably? WFL    How long can you stand comfortably? WFL w/ RUE supported    How long can you walk comfortably? 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 Yes    Pain Score 2     Pain Location Shoulder    Pain Orientation Right;Left    Pain Descriptors / Indicators Aching    Pain Type Acute pain;Surgical pain    Pain Onset More than a month ago    Aggravating Factors  lying supine    Pain Relieving Factors heat, meds  Treatment: In modified supine position at 45 degrees with towel under R shoulder and heat pad placed over RUE:  -PROM shoulder flexion x multiple reps, with 10 reps held at end range for 20-30 sec bouts.  -90 degrees flexion  -PROM  R shoulder abduction x multiple reps with 10 reps held at end range for 20-30 sec bouts  -80 degrees abduction  -PROM ER in 35 degrees shoulder abduction x multiple reps, 10 reps held for 20-30 sec bouts (<20 deg. ER, pain limited) -PROM bicep extension and flexion 20x with gentle trigger point mobilizations with movement  Upper extremity Ranger for active assisted range of motion x10 with 5 seconds hold at end range for flexion as well as abduction.  Patient notes fatigue at end of abduction  exercise but no reports of pain with any of the movement to this activity  Seated external rotation stretch with dowel 10 times with 5-second holds -Patient instructed with verbal cues and tactile cues in order to isolate shoulder external rotation.     Pt seated with elastogel (cold therapy) placed to R shoulder musculature x 4 min (unbilled)                             PT Short Term Goals - 11/13/21 1025       PT SHORT TERM GOAL #1   Title Pt will be independent with HEP in order to improve strength and decrease pain in order to improve pain-free function at home and work.    Baseline 11/18: pt reports compliance with HEP, HEP to be further advanced as per protocol    Time 2    Period Weeks    Status On-going    Target Date 11/27/21               PT Long Term Goals - 11/13/21 1025       PT LONG TERM GOAL #1   Title Patient will improve right shoulder PROM to > 140 degrees of flexion, scaption, and abduction for improved ability to perform overhead activities.    Baseline 10/18/21: flexion 41*, abd 60*; 11/18: flexoin 96 deg, abduction 81 deg, scaption 104 deg, ER 14 deg (following interventions)    Time 4    Period Weeks    Status Partially Met    Target Date 12/11/21      PT LONG TERM GOAL #2   Title Patient will improve right shoulder external rotation in order to perform hygiene/ADL's such as hair care pain-free.    Baseline 10/18/21: ER 0*; 11/18: ER 14 deg, pain limited    Time 4    Period Weeks    Status Partially Met    Target Date 12/11/21      PT LONG TERM GOAL #3   Title Pt will decrease quick DASH score by at least 8% in order to demonstrate clinically significant reduction in disability.    Baseline 10/18/21: 80% 11/18: 70.45%    Time 4    Period Weeks    Status Achieved      PT LONG TERM GOAL #4   Title Pt will decrease worst pain as reported on NPRS by at least 3 points in order to demonstrate clinically significant  reduction in pain    Baseline 10/18/21: 10/10 pain; 11/18: 6-7/10    Time 4    Period Weeks    Status Achieved      PT LONG TERM GOAL #5  Title Patient will increase FOTO score to equal to or greater than 55 to demonstrate statistically significant improvement in mobility and quality of life.    Baseline 10/18/21: 20%; 11/18: 32%    Time 4    Period Weeks    Status Partially Met    Target Date 12/11/21      Additional Long Term Goals   Additional Long Term Goals Yes      PT LONG TERM GOAL #6   Title Pt will decrease quick DASH score by at least 8% in order to demonstrate clinically significant reduction in disability.    Baseline 11/18: 70%    Time 4    Period Weeks    Status New    Target Date 12/11/21                   Plan - 11/19/21 1321     Clinical Impression Statement Patient continues to progress with active assisted range of motion with increased range of motion and decreased pain.  Utilized upper extremity Ranger for active assisted range of motion patient tolerated well.  Patient continues to have most limitations with external rotation range of motion is provided with new exercises utilizing dowel for further stretching of this area was instructed not to push into extremes of pain.  Patient continue to benefit from skilled physical therapy intervention in order to improve her right shoulder range of motion, strength, function and quality of life.    Personal Factors and Comorbidities Age;Time since onset of injury/illness/exacerbation    Examination-Activity Limitations Bathing;Reach Overhead;Dressing;Hygiene/Grooming;Toileting;Lift;Sleep;Carry    Examination-Participation Restrictions Driving;Cleaning;Meal Prep;Community Activity;Laundry;Occupation;Shop    Stability/Clinical Decision Making Stable/Uncomplicated    Rehab Potential Good    PT Frequency 3x / week    PT Duration 4 weeks    PT Treatment/Interventions ADLs/Self Care Home Management;Aquatic  Therapy;Biofeedback;Electrical Stimulation;Cryotherapy;Iontophoresis 4mg /ml Dexamethasone;Moist Heat;Traction;Ultrasound;DME Instruction;Functional mobility training;Therapeutic activities;Therapeutic exercise;Neuromuscular re-education;Patient/family education;Manual techniques;Scar mobilization;Passive range of motion;Dry needling;Other (comment);Joint Manipulations    PT Next Visit Plan home program for water aerobics on trip    PT [place] - flexion, abduction, ER (using PVC) - no printout; no updates; 11/18 to be performed LUE only Access Code: [email or web address]; 11/22: green theraputty for R grip strength x2 min    Consulted and Agree with Plan of Care Patient             Patient will benefit from skilled therapeutic intervention in order to improve the following deficits and impairments:  Decreased activity tolerance, Decreased range of motion, Hypomobility, Impaired perceived functional ability, Impaired UE functional use, Improper body mechanics, Pain, Decreased mobility  Visit Diagnosis: Stiffness of right shoulder, not elsewhere classified  Muscle weakness (generalized)  Acute pain of right shoulder  Traumatic complete tear of right rotator cuff, subsequent encounter     Problem List Patient Active Problem List   Diagnosis Date Noted   Adhesive capsulitis of right shoulder    Complete tear of right rotator cuff    Degenerative superior labral anterior-to-posterior (SLAP) tear of right shoulder    Healthcare maintenance 07/31/2021   Congenital pes cavus 02/25/2021   History of uterine fibroid 01/05/2021   Herpes 07/12/2020   GERD (gastroesophageal reflux disease) 05/23/2020   Vaginal atrophy 05/23/2020   Cervical spine arthritis 05/23/2020   Osteopenia 05/23/2020   Insomnia 05/20/2020   Postmenopausal hormone therapy 05/20/2020    Particia Lather, PT 11/19/2021, 1:30 PM  [place] MAIN RE[facility] SERVICES [address]  [place], [place], [address] Phone: [phone]   Fax:  [phone]  Name: Allure Greaser MRN: [id number] Date of Birth: Nov 26, 1961

## 2021-11-21 ENCOUNTER — Ambulatory Visit: Payer: 59 | Attending: Orthopedic Surgery | Admitting: Physical Therapy

## 2021-11-21 ENCOUNTER — Other Ambulatory Visit: Payer: Self-pay

## 2021-11-21 DIAGNOSIS — S46011D Strain of muscle(s) and tendon(s) of the rotator cuff of right shoulder, subsequent encounter: Secondary | ICD-10-CM | POA: Insufficient documentation

## 2021-11-21 DIAGNOSIS — M25511 Pain in right shoulder: Secondary | ICD-10-CM | POA: Insufficient documentation

## 2021-11-21 DIAGNOSIS — M25611 Stiffness of right shoulder, not elsewhere classified: Secondary | ICD-10-CM | POA: Diagnosis not present

## 2021-11-21 DIAGNOSIS — M6281 Muscle weakness (generalized): Secondary | ICD-10-CM | POA: Insufficient documentation

## 2021-11-21 DIAGNOSIS — M25512 Pain in left shoulder: Secondary | ICD-10-CM | POA: Insufficient documentation

## 2021-11-21 NOTE — Therapy (Signed)
[place] MAIN [facility] SERVICES [address] [place], [place], [address] Phone: [phone]   Fax:  [phone]  Physical Therapy Treatment  Patient Details  Name: [name] MRN: [id number] Date of Birth: 1961/07/21 No data recorded  Encounter Date: 11/21/2021   PT End of Session - 11/21/21 1046     Visit Number 15    Number of Visits 20    Date for PT Re-Evaluation 12/11/21    Progress Note Due on Visit 20    PT Start Time 1020    PT Stop Time 1101    PT Time Calculation (min) 41 min    Activity Tolerance Patient limited by pain;Patient tolerated treatment well    Behavior During Therapy [name] for tasks assessed/performed             Past Medical History:  Diagnosis Date   Complication of anesthesia    PONV (postoperative nausea and vomiting)     Past Surgical History:  Procedure Laterality Date   AUGMENTATION MAMMAPLASTY Bilateral 2010   BICEPT TENODESIS Right 09/24/2021   Procedure: BICEPS TENODESIS;  Surgeon: [name], MD;  Location: [place];  Service: Orthopedics;  Laterality: Right;   CERVICAL SPINE SURGERY  2003   C5-C7- double dissectomy   PAROTID GLAND TUMOR EXCISION     SHOULDER ARTHROSCOPY Right 09/24/2021   Procedure: right shoulder arthroscopy, debridement,open rotator cuff tear repair;  Surgeon: [name], MD;  Location: [place];  Service: Orthopedics;  Laterality: Right;   WISDOM TOOTH EXTRACTION Bilateral     There were no vitals filed for this visit.   Subjective Assessment - 11/21/21 1044     Subjective Patient leaves next week for [place] for a trip. Has her surgeon follow up in mid december. Reports improvement in ability to lie supine but states L UE tends to "go numb" with this activity. R UE able to tolerate supine better.    Pertinent History Pt is a 60 y/o female s/p right shoulder arthroscopy with superior labral debridement, biceps tendon release, subacromial decompression, mini open  rotator cuff tear repair and biceps tenodesis - surgery preformed on 09/24/21. She had a follow-up appt with Dr. [name] on 10/26 - sling was removed this date. MD encourages wearing sling at night, otherwise encourages leaving sling off during the day. Shoulder injury occured in March 2022 - she was using a scooter due to an ankle injury. She fell off of the scooter and onto her right shoulder. 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 Of note, pt also reports C5-C7 anterior fusion in 2003.    Limitations Writing;House hold activities;Lifting;Other (comment);Walking;Standing    How long can you sit comfortably? WFL    How long can you stand comfortably? WFL w/ RUE supported    How long can you walk comfortably? 30 minutes w/o sling    Patient Stated Goals full ROM, eventually return to work Investment banker, corporate at [employer]), be able to use arm w/o pain    Pain Score 3     Pain Location Shoulder    Pain Orientation Right    Pain Descriptors / Indicators Aching    Pain Type Surgical pain    Pain Onset More than a month ago    Pain Frequency Constant    Aggravating Factors  lying supine    Pain Relieving Factors heat, meds    Effect of Pain on Daily Activities decreased use  if UE             Treatment: In modified supine position at 30 degrees heat pad placed over and post to RUE:  -PROM shoulder flexion x multiple reps, with 10 reps held at end range for 20-30 sec bouts. -PROM  R shoulder abduction x multiple reps with 10 reps held at end range for 20-30 sec bouts -PROM ER in 35 degrees shoulder abduction x multiple reps, 10 reps held for 20-30 sec bouts (<20 deg. ER, pain limited) - gentle trigger point mobilization to muscle body of subscapularis in order to improve shoulder rotation ROM tolerance, slight improvement in ER tolerance following  Wall ladder 2 x 5 reps with 5 sec holds at end range for flexion  1 x 5 with 5 sec holds for abduction and  scaption range of motion.  107 degrees shoulder flexion with wall ladder.  -between each set pt instructed in shoulder rolls to improve blood flow for recovery  Seated external rotation stretch with dowel 10 times with 5-second holds -Patient instructed with verbal cues and tactile cues in order to isolate shoulder external rotation.                           PT Education - 11/21/21 1045     Education Details Exercise form and technique    Person(s) Educated Patient    Methods Explanation;Demonstration;Tactile cues    Comprehension Verbalized understanding;Returned demonstration;Verbal cues required;Tactile cues required              PT Short Term Goals - 11/13/21 1025       PT SHORT TERM GOAL #1   Title Pt will be independent with HEP in order to improve strength and decrease pain in order to improve pain-free function at home and work.    Baseline 11/18: pt reports compliance with HEP, HEP to be further advanced as per protocol    Time 2    Period Weeks    Status On-going    Target Date 11/27/21               PT Long Term Goals - 11/13/21 1025       PT LONG TERM GOAL #1   Title Patient will improve right shoulder PROM to > 140 degrees of flexion, scaption, and abduction for improved ability to perform overhead activities.    Baseline 10/18/21: flexion 41*, abd 60*; 11/18: flexoin 96 deg, abduction 81 deg, scaption 104 deg, ER 14 deg (following interventions)    Time 4    Period Weeks    Status Partially Met    Target Date 12/11/21      PT LONG TERM GOAL #2   Title Patient will improve right shoulder external rotation in order to perform hygiene/ADL's such as hair care pain-free.    Baseline 10/18/21: ER 0*; 11/18: ER 14 deg, pain limited    Time 4    Period Weeks    Status Partially Met    Target Date 12/11/21      PT LONG TERM GOAL #3   Title Pt will decrease quick DASH score by at least 8% in order to demonstrate clinically  significant reduction in disability.    Baseline 10/18/21: 80% 11/18: 70.45%    Time 4    Period Weeks    Status Achieved      PT LONG TERM GOAL #4   Title Pt will decrease worst pain as reported  on NPRS by at least 3 points in order to demonstrate clinically significant reduction in pain    Baseline 10/18/21: 10/10 pain; 11/18: 6-7/10    Time 4    Period Weeks    Status Achieved      PT LONG TERM GOAL #5   Title Patient will increase FOTO score to equal to or greater than 55 to demonstrate statistically significant improvement in mobility and quality of life.    Baseline 10/18/21: 20%; 11/18: 32%    Time 4    Period Weeks    Status Partially Met    Target Date 12/11/21      Additional Long Term Goals   Additional Long Term Goals Yes      PT LONG TERM GOAL #6   Title Pt will decrease quick DASH score by at least 8% in order to demonstrate clinically significant reduction in disability.    Baseline 11/18: 70%    Time 4    Period Weeks    Status New    Target Date 12/11/21                   Plan - 11/21/21 1049     Clinical Impression Statement Patient continues to progress with active assisted range of motion with increased range of motion and decreased pain. Pt making good progress with flexion and abduction AAORm with wall ladder this session.  Patient continue to benefit from skilled physical therapy intervention in order to improve her right shoulder range of motion, strength, function and quality of life.    Personal Factors and Comorbidities Age;Time since onset of injury/illness/exacerbation    Examination-Activity Limitations Bathing;Reach Overhead;Dressing;Hygiene/Grooming;Toileting;Lift;Sleep;Carry    Examination-Participation Restrictions Driving;Cleaning;Meal Prep;Community Activity;Laundry;Occupation;Shop    Stability/Clinical Decision Making Stable/Uncomplicated    Rehab Potential Good    PT Frequency 3x / week    PT Duration 4 weeks    PT  Treatment/Interventions ADLs/Self Care Home Management;Aquatic Therapy;Biofeedback;Electrical Stimulation;Cryotherapy;Iontophoresis 4mg /ml Dexamethasone;Moist Heat;Traction;Ultrasound;DME Instruction;Functional mobility training;Therapeutic activities;Therapeutic exercise;Neuromuscular re-education;Patient/family education;Manual techniques;Scar mobilization;Passive range of motion;Dry needling;Other (comment);Joint Manipulations    PT Next Visit Plan home program for water aerobics on trip    PT [place] - flexion, abduction, ER (using PVC) - no printout; no updates; 11/18 to be performed LUE only Access Code: [email or web address]; 11/22: green theraputty for R grip strength x2 min    Consulted and Agree with Plan of Care Patient             Patient will benefit from skilled therapeutic intervention in order to improve the following deficits and impairments:  Decreased activity tolerance, Decreased range of motion, Hypomobility, Impaired perceived functional ability, Impaired UE functional use, Improper body mechanics, Pain, Decreased mobility  Visit Diagnosis: Stiffness of right shoulder, not elsewhere classified  Muscle weakness (generalized)  Acute pain of right shoulder  Traumatic complete tear of right rotator cuff, subsequent encounter     Problem List Patient Active Problem List   Diagnosis Date Noted   Adhesive capsulitis of right shoulder    Complete tear of right rotator cuff    Degenerative superior labral anterior-to-posterior (SLAP) tear of right shoulder    Healthcare maintenance 07/31/2021   Congenital pes cavus 02/25/2021   History of uterine fibroid 01/05/2021   Herpes 07/12/2020   GERD (gastroesophageal reflux disease) 05/23/2020   Vaginal atrophy 05/23/2020   Cervical spine arthritis 05/23/2020   Osteopenia 05/23/2020   Insomnia 05/20/2020   Postmenopausal hormone therapy 05/20/2020    Harrell Gave B  [place], PT 11/21/2021, 12:43 PM  [place] MAIN [facility] SERVICES [address] [place], [place], [address] Phone: [phone]   Fax:  [phone]  Name: Maretta Overdorf MRN: [id number] Date of Birth: 08-Feb-1961

## 2021-11-29 ENCOUNTER — Telehealth: Payer: Self-pay | Admitting: Orthopedic Surgery

## 2021-11-29 NOTE — Telephone Encounter (Signed)
Pt calling stating her disability group is giving her trouble stating she was only supposed to be out until December 1st and needs Dr. [name] to send her previous office visit as well as a letter or note stating why she is being kept out of work until January. 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 The best call back number is [phone].

## 2021-11-29 NOTE — Telephone Encounter (Signed)
Thank you for helping me with this. Note has been done.

## 2021-11-29 NOTE — Telephone Encounter (Signed)
IC patient, lmvm for pt to [facility]

## 2021-11-29 NOTE — Telephone Encounter (Signed)
Spoke with pt. She needs note stating oow through 12/27/21. Fax to [facility] with last ov note.   Lauren-let me know when note is in and I will fax. Thanks.

## 2021-12-02 ENCOUNTER — Telehealth: Payer: Self-pay | Admitting: Orthopedic Surgery

## 2021-12-02 NOTE — Telephone Encounter (Signed)
Oow note and last ov note faxed to [facility] [phone] claim number [id number]

## 2021-12-02 NOTE — Telephone Encounter (Signed)
Received vm from pt checking on note. IC,lmvm advised oow note and last ov note was faxed to [facility] this morning.

## 2021-12-03 ENCOUNTER — Encounter: Payer: Self-pay | Admitting: Physical Therapy

## 2021-12-03 ENCOUNTER — Ambulatory Visit: Payer: 59 | Admitting: Physical Therapy

## 2021-12-03 ENCOUNTER — Other Ambulatory Visit: Payer: Self-pay

## 2021-12-03 DIAGNOSIS — M25511 Pain in right shoulder: Secondary | ICD-10-CM

## 2021-12-03 DIAGNOSIS — M6281 Muscle weakness (generalized): Secondary | ICD-10-CM | POA: Diagnosis not present

## 2021-12-03 DIAGNOSIS — M25512 Pain in left shoulder: Secondary | ICD-10-CM | POA: Diagnosis not present

## 2021-12-03 DIAGNOSIS — M25611 Stiffness of right shoulder, not elsewhere classified: Secondary | ICD-10-CM

## 2021-12-03 DIAGNOSIS — S46011D Strain of muscle(s) and tendon(s) of the rotator cuff of right shoulder, subsequent encounter: Secondary | ICD-10-CM | POA: Diagnosis not present

## 2021-12-03 NOTE — Therapy (Signed)
[place] MAIN [facility] SERVICES [address] [place], [place], [address] Phone: [phone]   Fax:  [phone]  Physical Therapy Treatment  Patient Details  Name: [name] MRN: [id number] Date of Birth: 1961/11/07 No data recorded  Encounter Date: 12/03/2021   PT End of Session - 12/03/21 0900     Visit Number 16    Number of Visits 20    Date for PT Re-Evaluation 12/11/21    Progress Note Due on Visit 20    PT Start Time 0851    PT Stop Time 0930    PT Time Calculation (min) 39 min    Activity Tolerance Patient limited by pain;Patient tolerated treatment well    Behavior During Therapy [facility] for tasks assessed/performed             Past Medical History:  Diagnosis Date   Complication of anesthesia    PONV (postoperative nausea and vomiting)     Past Surgical History:  Procedure Laterality Date   AUGMENTATION MAMMAPLASTY Bilateral 2010   BICEPT TENODESIS Right 09/24/2021   Procedure: BICEPS TENODESIS;  Surgeon: [name], MD;  Location: [place];  Service: Orthopedics;  Laterality: Right;   CERVICAL SPINE SURGERY  2003   C5-C7- double dissectomy   PAROTID GLAND TUMOR EXCISION     SHOULDER ARTHROSCOPY Right 09/24/2021   Procedure: right shoulder arthroscopy, debridement,open rotator cuff tear repair;  Surgeon: [name], MD;  Location: [place];  Service: Orthopedics;  Laterality: Right;   WISDOM TOOTH EXTRACTION Bilateral     There were no vitals filed for this visit.   Subjective Assessment - 12/03/21 0859     Subjective Patient reports having a great trip in [place]. She was able to do some exercise in the pool every day. She was unable to use pulley because the doors were too tall.    Pertinent History Pt is a 60 y/o female s/p right shoulder arthroscopy with superior labral debridement, biceps tendon release, subacromial decompression, mini open rotator cuff tear repair and biceps tenodesis - surgery  preformed on 09/24/21. She had a follow-up appt with Dr. [name] on 10/26 - sling was removed this date. MD encourages wearing sling at night, otherwise encourages leaving sling off during the day. Shoulder injury occured in March 2022 - she was using a scooter due to an ankle injury. She fell off of the scooter and onto her right shoulder. 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 Of note, pt also reports C5-C7 anterior fusion in 2003.    Limitations Writing;House hold activities;Lifting;Other (comment);Walking;Standing    How long can you sit comfortably? WFL    How long can you stand comfortably? WFL w/ RUE supported    How long can you walk comfortably? 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 No/denies    Pain Onset More than a month ago              Treatment: In modified supine position at 30 degrees heat pad placed over and post to RUE:  PT performed PROM to RUE with guarding noted; Patient had increased pain in right shoulder at end range with increased guarding  PT performed grade II-III inferior glides to RUE shoulder 15 sec bouts x4 sets PT performed grade II  PA/AP mobs to RUE shoulder 15 sec bouts x4 sets each  PT identified increased tightness along biceps and subscapularis PT performed soft tissue massage using edge tool for IASTM to help reduce tightness x10 min; Patient tolerated well reporting less soreness and exhibiting better tissue extensibility  PT assessed goals including PROM, see below            [facility] PT Assessment - 12/03/21 0001       Observation/Other Assessments   Other Surveys  Quick Dash    Quick DASH  54.5%      PROM   Right Shoulder Flexion 118 Degrees    Right Shoulder ABduction 90 Degrees    Right Shoulder Internal Rotation 85 Degrees   shoulder at 30 degrees abduction, semi-reclined  position   Right Shoulder External Rotation 24 Degrees   at 30 degrees abduction, semi-reclined position            Patient continues to progress in ROM and mobility. She does have increased pain levels at end range especially with increased guarding;                       PT Education - 12/03/21 0859     Education Details ROM/positioning;    Person(s) Educated Patient    Methods Explanation;Verbal cues    Comprehension Verbalized understanding;Returned demonstration;Verbal cues required;Need further instruction              PT Short Term Goals - 12/03/21 0900       PT SHORT TERM GOAL #1   Title Pt will be independent with HEP in order to improve strength and decrease pain in order to improve pain-free function at home and work.    Baseline 11/18: pt reports compliance with HEP, HEP to be further advanced as per protocol, 12/13: still doing regularly, no issues    Time 2    Period Weeks    Status On-going    Target Date 11/27/21               PT Long Term Goals - 12/03/21 0928       PT LONG TERM GOAL #1   Title Patient will improve right shoulder PROM to > 140 degrees of flexion, scaption, and abduction for improved ability to perform overhead activities.    Baseline 10/18/21: flexion 41*, abd 60*; 11/18: flexoin 96 deg, abduction 81 deg, scaption 104 deg, ER 14 deg (following interventions), 12/13: see flowsheet    Time 4    Period Weeks    Status Partially Met    Target Date 12/11/21      PT LONG TERM GOAL #2   Title Patient will improve right shoulder external rotation in order to perform hygiene/ADL's such as hair care pain-free.    Baseline 10/18/21: ER 0*; 11/18: ER 14 deg, pain limited, 12/13: see flowsheet    Time 4    Period Weeks    Status Partially Met    Target Date 12/11/21      PT LONG TERM GOAL #3   Title Pt will decrease quick DASH score by at least 8% in order to demonstrate clinically significant reduction in  disability.    Baseline 10/18/21: 80% 11/18: 70.45%    Time 4    Period Weeks    Status Achieved      PT LONG TERM GOAL #4   Title Pt will decrease worst pain as reported on NPRS by at least 3 points in  order to demonstrate clinically significant reduction in pain    Baseline 10/18/21: 10/10 pain; 11/18: 6-7/10,    Time 4    Period Weeks    Status Achieved      PT LONG TERM GOAL #5   Title Patient will increase FOTO score to equal to or greater than 55 to demonstrate statistically significant improvement in mobility and quality of life.    Baseline 10/18/21: 20%; 11/18: 32%, 12/13: deferred    Time 4    Period Weeks    Status Partially Met    Target Date 12/11/21      PT LONG TERM GOAL #6   Title Pt will decrease quick DASH score by at least 8% in order to demonstrate clinically significant reduction in disability.    Baseline 11/18: 70%, 12/13: 54.5%    Time 4    Period Weeks    Status Achieved    Target Date 12/11/21                   Plan - 12/03/21 1154     Clinical Impression Statement Patient motivated and participated well within session. She continues to have limited ROM with increased guarding at start of session. Patient did exhibit increased tightness along right Biceps. PT performed soft tissue massage utilizing edge tool for IASTM. Patient tolerated well exhibiting improved tissue extensibility. Following manual therapy, patient able to relax and exhibit significant improvement in shoulder PROM. PT assessed goals. Overall patient continues to exhibit improvement in ROM and mobility. She would benefit from additional skilled PT intervention to improve shoulder ROM and reduce pain while improving mobility;    Personal Factors and Comorbidities Age;Time since onset of injury/illness/exacerbation    Examination-Activity Limitations Bathing;Reach Overhead;Dressing;Hygiene/Grooming;Toileting;Lift;Sleep;Carry    Examination-Participation Restrictions  Driving;Cleaning;Meal Prep;Community Activity;Laundry;Occupation;Shop    Stability/Clinical Decision Making Stable/Uncomplicated    Rehab Potential Good    PT Frequency 3x / week    PT Duration 4 weeks    PT Treatment/Interventions ADLs/Self Care Home Management;Aquatic Therapy;Biofeedback;Electrical Stimulation;Cryotherapy;Iontophoresis 72m/ml Dexamethasone;Moist Heat;Traction;Ultrasound;DME Instruction;Functional mobility training;Therapeutic activities;Therapeutic exercise;Neuromuscular re-education;Patient/family education;Manual techniques;Scar mobilization;Passive range of motion;Dry needling;Other (comment);Joint Manipulations    PT Next Visit Plan home program for water aerobics on trip    PT HD[place]ton- flexion, abduction, ER (using PVC) - no printout; no updates; 11/18 to be performed LUE only Access Code: [email or web address]; 11/22: green theraputty for R grip strength x2 min    Consulted and Agree with Plan of Care Patient             Patient will benefit from skilled therapeutic intervention in order to improve the following deficits and impairments:  Decreased activity tolerance, Decreased range of motion, Hypomobility, Impaired perceived functional ability, Impaired UE functional use, Improper body mechanics, Pain, Decreased mobility  Visit Diagnosis: Stiffness of right shoulder, not elsewhere classified  Muscle weakness (generalized)  Acute pain of right shoulder     Problem List Patient Active Problem List   Diagnosis Date Noted   Adhesive capsulitis of right shoulder    Complete tear of right rotator cuff    Degenerative superior labral anterior-to-posterior (SLAP) tear of right shoulder    Healthcare maintenance 07/31/2021   Congenital pes cavus 02/25/2021   History of uterine fibroid 01/05/2021   Herpes 07/12/2020   GERD (gastroesophageal reflux disease) 05/23/2020   Vaginal atrophy 05/23/2020   Cervical spine arthritis 05/23/2020   Osteopenia  05/23/2020   Insomnia 05/20/2020   Postmenopausal hormone therapy 05/20/2020  Jawann Urbani, PT, DPT 12/03/2021, 11:59 AM  [place] MAIN [facility] SERVICES [place], [place], [address] Phone: [phone]   Fax:  [phone]  Name: [name] MRN: [id number] Date of Birth: [birth year]

## 2021-12-04 ENCOUNTER — Ambulatory Visit (INDEPENDENT_AMBULATORY_CARE_PROVIDER_SITE_OTHER): Payer: 59

## 2021-12-04 ENCOUNTER — Ambulatory Visit (INDEPENDENT_AMBULATORY_CARE_PROVIDER_SITE_OTHER): Payer: 59 | Admitting: Surgical

## 2021-12-04 ENCOUNTER — Encounter: Payer: Self-pay | Admitting: Orthopedic Surgery

## 2021-12-04 DIAGNOSIS — M25512 Pain in left shoulder: Secondary | ICD-10-CM | POA: Diagnosis not present

## 2021-12-04 DIAGNOSIS — M75121 Complete rotator cuff tear or rupture of right shoulder, not specified as traumatic: Secondary | ICD-10-CM

## 2021-12-04 NOTE — Progress Notes (Signed)
Post-Op Visit Note   Patient: [name]           Date of Birth: [birth year]           MRN: [id number] Visit Date: 12/04/2021 PCP: Venita Lick, NP   Assessment & Plan:  Chief Complaint:  Chief Complaint  Patient presents with   Right Shoulder - Post-op Follow-up   Visit Diagnoses:  1. Complete tear of right rotator cuff, unspecified whether traumatic     Plan: Patient is a 60 year old female who presents for reevaluation s/p right shoulder rotator cuff tear repair and biceps tenodesis on 09/24/2021.  She reports the shoulder is continually improving.  She is working with physical therapy on range of motion but has not started strengthening.  Taking gabapentin, Celebrex, Robaxin, tramadol as needed for pain control.  Going to physical therapy 3 times per week.  On exam she has 20 degrees external rotation, 80 degrees abduction, 110 degrees forward flexion.  5/5 motor strength of supraspinatus and subscapularis with 5 -/5 strength of external rotation.  Incisions are well-healed.  Plan is to continue with physical therapy and okay to start strengthening now which should help with her external rotation strength that is somewhat lacking on exam today.  Follow-up in 5 to 6 weeks for clinical recheck.  Patient also has concerns with left shoulder pain that was bothering her more at her last visit.  It is somewhat improved today compared with her last visit with Dr. [name].  This pain is worst in the morning and she localizes it primarily to the lateral shoulder with radiation to the elbow at times.  Denies any neck pain, radicular pain, forearm or upper arm numbness or tingling, scapular pain.  She does note some left hand numbness and tingling in all 5 fingers on the palmar side that primarily bothers her at night and she does not notice this during the day.  On exam she has 50 degrees external rotation, 105 degrees abduction, 160 degrees forward flexion of the left shoulder with excellent  rotator cuff strength of supra, infra, subscap.  She does have some coarseness and crepitus on the left side that is not present on the right which may represent a partial-thickness rotator cuff tear.  5/5 motor strength of bilateral grip strength, finger abduction, pronation/supination, bicep, tricep, deltoid.  No subluxing ulnar nerve.  Positive Tinel's sign over the carpal tunnel.  No muscle wasting noted in the left hand.  Radiographs taken of the left shoulder are fairly unremarkable.  Plan is to have her work with therapy for the left shoulder as well as the right shoulder.  Follow-up in 6 weeks for clinical recheck and final check on the right shoulder and to see how the left shoulder is coming along.  Her main concern on the left arm seems to be the numbness and tingling.  Offered to order nerve conduction study but she states that she is afraid of this due to the amount of pain that may cause her so she wants to hold off on this for now.  Recommended a wrist splint to wear at night to see if this will help as some of her symptoms sound related to carpal tunnel syndrome.  She will try this and follow-up in 6 weeks.  Extended her absence from work until 01/13/2022 so that she will have time for another appointment to fully evaluate her rotator cuff strength after she started strengthening exercises.  Follow-Up Instructions: No follow-ups on file.  Orders:  Orders Placed This Encounter  Procedures   XR Shoulder Left   No orders of the defined types were placed in this encounter.   Imaging: No results found.  PMFS History: Patient Active Problem List   Diagnosis Date Noted   Adhesive capsulitis of right shoulder    Complete tear of right rotator cuff    Degenerative superior labral anterior-to-posterior (SLAP) tear of right shoulder    Healthcare maintenance 07/31/2021   Congenital pes cavus 02/25/2021   History of uterine fibroid 01/05/2021   Herpes 07/12/2020   GERD  (gastroesophageal reflux disease) 05/23/2020   Vaginal atrophy 05/23/2020   Cervical spine arthritis 05/23/2020   Osteopenia 05/23/2020   Insomnia 05/20/2020   Postmenopausal hormone therapy 05/20/2020   Past Medical History:  Diagnosis Date   Complication of anesthesia    PONV (postoperative nausea and vomiting)     Family History  Problem Relation Age of Onset   Lung cancer Mother    Thyroid disease Mother    Hypertension Mother    Hyperlipidemia Mother    CAD Mother        stent x 1   Osteoporosis Mother    Leukemia Father 79   Rheum arthritis Sister    Melanoma Sister    Osteopenia Sister    Autoimmune disease Daughter    Colon cancer Maternal Uncle    Heart disease Maternal Grandmother    Colon cancer Maternal Grandfather    Heart disease Maternal Grandfather    Stroke Maternal Grandfather    Heart disease Paternal Grandfather     Past Surgical History:  Procedure Laterality Date   AUGMENTATION MAMMAPLASTY Bilateral 2010   BICEPT TENODESIS Right 09/24/2021   Procedure: BICEPS TENODESIS;  Surgeon: [name], MD;  Location: [place];  Service: Orthopedics;  Laterality: Right;   CERVICAL SPINE SURGERY  2003   C5-C7- double dissectomy   PAROTID GLAND TUMOR EXCISION     SHOULDER ARTHROSCOPY Right 09/24/2021   Procedure: right shoulder arthroscopy, debridement,open rotator cuff tear repair;  Surgeon: [name], MD;  Location: [place];  Service: Orthopedics;  Laterality: Right;   WISDOM TOOTH EXTRACTION Bilateral    Social History   Occupational History   Not on file  Tobacco Use   Smoking status: Never   Smokeless tobacco: Never  Vaping Use   Vaping Use: Never used  Substance and Sexual Activity   Alcohol use: Yes    Comment: ocassional   Drug use: Never   Sexual activity: Yes

## 2021-12-05 ENCOUNTER — Other Ambulatory Visit: Payer: Self-pay

## 2021-12-05 ENCOUNTER — Ambulatory Visit: Payer: 59

## 2021-12-05 DIAGNOSIS — M6281 Muscle weakness (generalized): Secondary | ICD-10-CM

## 2021-12-05 DIAGNOSIS — M25512 Pain in left shoulder: Secondary | ICD-10-CM | POA: Diagnosis not present

## 2021-12-05 DIAGNOSIS — M25611 Stiffness of right shoulder, not elsewhere classified: Secondary | ICD-10-CM

## 2021-12-05 DIAGNOSIS — S46011D Strain of muscle(s) and tendon(s) of the rotator cuff of right shoulder, subsequent encounter: Secondary | ICD-10-CM | POA: Diagnosis not present

## 2021-12-05 DIAGNOSIS — M25511 Pain in right shoulder: Secondary | ICD-10-CM | POA: Diagnosis not present

## 2021-12-05 NOTE — Therapy (Signed)
[place] MAIN [facility] SERVICES [address] [place], [place], [address] Phone: [phone]   Fax:  [phone]  Physical Therapy Treatment/Re-eval  Patient Details  Name: [name] MRN: [id number] Date of Birth: 09/04/1961 No data recorded  Encounter Date: 12/05/2021   PT End of Session - 12/05/21 0923     Visit Number 17    Number of Visits 20    Date for PT Re-Evaluation 12/11/21    Progress Note Due on Visit 20    PT Start Time 0930    PT Stop Time 1014    PT Time Calculation (min) 44 min    Activity Tolerance Patient limited by pain;Patient tolerated treatment well    Behavior During Therapy [facility] for tasks assessed/performed             Past Medical History:  Diagnosis Date   Complication of anesthesia    PONV (postoperative nausea and vomiting)     Past Surgical History:  Procedure Laterality Date   AUGMENTATION MAMMAPLASTY Bilateral 2010   BICEPT TENODESIS Right 09/24/2021   Procedure: BICEPS TENODESIS;  Surgeon: [name], MD;  Location: [place];  Service: Orthopedics;  Laterality: Right;   CERVICAL SPINE SURGERY  2003   C5-C7- double dissectomy   PAROTID GLAND TUMOR EXCISION     SHOULDER ARTHROSCOPY Right 09/24/2021   Procedure: right shoulder arthroscopy, debridement,open rotator cuff tear repair;  Surgeon: [name], MD;  Location: [place];  Service: Orthopedics;  Laterality: Right;   WISDOM TOOTH EXTRACTION Bilateral     There were no vitals filed for this visit.   Subjective Assessment - 12/05/21 1114     Subjective Patient saw physician yesterday, is pleased with progression of ROM.New order for starting strengthening of R shoulder, order for L shoulder    Pertinent History Pt is a 60 y/o female s/p right shoulder arthroscopy with superior labral debridement, biceps tendon release, subacromial decompression, mini open rotator cuff tear repair and biceps tenodesis - surgery preformed on 09/24/21.  She had a follow-up appt with Dr. [name] on 10/26 - sling was removed this date. MD encourages wearing sling at night, otherwise encourages leaving sling off during the day. Shoulder injury occured in March 2022 - she was using a scooter due to an ankle injury. She fell off of the scooter and onto her right shoulder. 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 Of note, pt also reports C5-C7 anterior fusion in 2003.    Limitations Writing;House hold activities;Lifting;Other (comment);Walking;Standing    How long can you sit comfortably? WFL    How long can you stand comfortably? WFL w/ RUE supported    How long can you walk comfortably? 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 Yes    Pain Score 4     Pain Location Shoulder    Pain Orientation Right;Left    Pain Descriptors / Indicators Aching    Pain Type Surgical pain    Pain Onset More than a month ago    Pain Frequency Constant               New order for starting strengthening of R shoulder, order for L shoulder    PAIN: L arm: Worst: 5-6/10 anterior aspect, bicep region, and mid delt  region; hands get tingling and fall asleep Least: 1-2/10 Has to use L arm more than R due to injury on right side.   PROM/AROM:  AROM BUE  Left  Shoulder Flexion 122*  Shoulder Abduction 124*  ER T2  IR T10   *very sore  Accessory Motions:  GH joint: AP hypomobile , PA empty end feel, inferior hard stop with pain Scapula: slight winging with gentle mobilization  Clavicle: WFL  Ribs:  -grade II hypomobile and elevated  STRENGTH:  Graded on a 0-5 scale Muscle Group Left  Shoulder flex 4/5  Shoulder Abd 3+/5* pain  Shoulder Ext 4/5  Shoulder IR/ER 4-/5  Elbow 4+/5 *bicep pain  Wrist/hand 4+/5   SPECIAL TESTS:  Median nerve glide: + Ulnar nerve glide + Michel Bickers  - Painful arc + IR resisted strength + Cross body adduction test + Biceps Load 1 : - Crank test - Biceps load 2 - Surprise test - Biceps tendon palpation +   Treatment: PT performed soft tissue massage using edge tool for IASTM to help reduce tightness x10 min; Patient tolerated well reporting less soreness and exhibiting better tissue extensibility      Access Code: [email or web address] URL: [email or web address] Date: 12/05/2021 Prepared by: Janna Arch  Exercises Median Nerve Flossing - Tray - 1 x daily - 7 x weekly - 2 sets - 10 reps - 5 hold Ulnar Nerve Flossing - 1 x daily - 7 x weekly - 2 sets - 10 reps - 5 hold Supine Chest Press with Dumbbells - 1 x daily - 7 x weekly - 2 sets - 10 reps - 5 hold Shoulder Flexion AAROM - 1 x daily - 7 x weekly - 2 sets - 10 reps - 5 hold  Pt educated throughout session about proper posture and technique with exercises. Improved exercise technique, movement at target joints, use of target muscles after min to mod verbal, visual, tactile cues.     Patient presents with new order for L shoulder as well as additional order for beginning strengthening portion of protocol for R surgical UE. Patients signs and symptoms are indicative of potential impingement with nerve involvement and overuse of bicep and deltoid musculature. Patient's POC will be updated with new goal addressing PT. Introduction to strengthening interventions tolerated well with additional introduction to be performed next session. Patient did exhibit increased tightness along right Biceps. PT performed soft tissue massage utilizing edge tool for IASTM. She would benefit from additional skilled PT intervention to improve shoulder ROM and reduce pain while improving mobility               PT Education - 12/05/21 0922     Education Details exercise technique, body mechanics    Person(s) Educated Patient    Methods Explanation;Demonstration;Tactile cues;Verbal  cues    Comprehension Verbalized understanding;Returned demonstration;Verbal cues required;Tactile cues required              PT Short Term Goals - 12/03/21 0900       PT SHORT TERM GOAL #1   Title Pt will be independent with HEP in order to improve strength and decrease pain in order to improve pain-free function at home and work.    Baseline 11/18: pt reports compliance with HEP, HEP to be further advanced as per protocol, 12/13: still doing regularly, no issues    Time 2    Period Weeks    Status On-going    Target Date 11/27/21  PT Long Term Goals - 12/05/21 1055       PT LONG TERM GOAL #1   Title Patient will improve right shoulder PROM to > 140 degrees of flexion, scaption, and abduction for improved ability to perform overhead activities.    Baseline 10/18/21: flexion 41*, abd 60*; 11/18: flexoin 96 deg, abduction 81 deg, scaption 104 deg, ER 14 deg (following interventions), 12/13: see flowsheet    Time 4    Period Weeks    Status Partially Met    Target Date 12/11/21      PT LONG TERM GOAL #2   Title Patient will improve right shoulder external rotation in order to perform hygiene/ADL's such as hair care pain-free.    Baseline 10/18/21: ER 0*; 11/18: ER 14 deg, pain limited, 12/13: see flowsheet    Time 4    Period Weeks    Status Partially Met    Target Date 12/11/21      PT LONG TERM GOAL #3   Title Pt will decrease quick DASH score by at least 8% in order to demonstrate clinically significant reduction in disability.    Baseline 10/18/21: 80% 11/18: 70.45%    Time 4    Period Weeks    Status Achieved      PT LONG TERM GOAL #4   Title Pt will decrease worst pain as reported on NPRS by at least 3 points in order to demonstrate clinically significant reduction in pain    Baseline 10/18/21: 10/10 pain; 11/18: 6-7/10,    Time 4    Period Weeks    Status Achieved      PT LONG TERM GOAL #5   Title Patient will increase FOTO score to equal  to or greater than 55 to demonstrate statistically significant improvement in mobility and quality of life.    Baseline 10/18/21: 20%; 11/18: 32%, 12/13: deferred    Time 4    Period Weeks    Status Partially Met    Target Date 12/11/21      Additional Long Term Goals   Additional Long Term Goals Yes      PT LONG TERM GOAL #6   Title Pt will decrease quick DASH score by at least 8% in order to demonstrate clinically significant reduction in disability.    Baseline 11/18: 70%, 12/13: 54.5%    Time 4    Period Weeks    Status Achieved    Target Date 12/11/21      PT LONG TERM GOAL #7   Title Patient will decrease pain VAS to <3/10 for L shoulder to improve quality of life and allow patient to sleep through the night.    Baseline 12/15: 6/10    Time 4    Period Weeks    Status New    Target Date 12/11/21                   Plan - 12/05/21 1116     Clinical Impression Statement Patient presents with new order for L shoulder as well as additional order for beginning strengthening portion of protocol for R surgical UE. Patients signs and symptoms are indicative of potential impingement with nerve involvement and overuse of bicep and deltoid musculature. Patient's POC will be updated with new goal addressing PT. Introduction to strengthening interventions tolerated well with additional introduction to be performed next session. Patient did exhibit increased tightness along right Biceps. PT performed soft tissue massage utilizing edge tool for IASTM. She would benefit  from additional skilled PT intervention to improve shoulder ROM and reduce pain while improving mobility    Personal Factors and Comorbidities Age;Time since onset of injury/illness/exacerbation    Examination-Activity Limitations Bathing;Reach Overhead;Dressing;Hygiene/Grooming;Toileting;Lift;Sleep;Carry    Examination-Participation Restrictions Driving;Cleaning;Meal Prep;Community Activity;Laundry;Occupation;Shop     Stability/Clinical Decision Making Stable/Uncomplicated    Rehab Potential Good    PT Frequency 3x / week    PT Duration 4 weeks    PT Treatment/Interventions ADLs/Self Care Home Management;Aquatic Therapy;Biofeedback;Electrical Stimulation;Cryotherapy;Iontophoresis 30m/ml Dexamethasone;Moist Heat;Traction;Ultrasound;DME Instruction;Functional mobility training;Therapeutic activities;Therapeutic exercise;Neuromuscular re-education;Patient/family education;Manual techniques;Scar mobilization;Passive range of motion;Dry needling;Other (comment);Joint Manipulations    PT Next Visit Plan progress strengthening of R, pain reduction of L    PT Home Exercise Plan RUE AAROM - flexion, abduction, ER (using PVC) - no printout; no updates; 11/18 to be performed LUE only Access Code: [email or web address]; 11/22: green theraputty for R grip strength x2 min    Consulted and Agree with Plan of Care Patient             Patient will benefit from skilled therapeutic intervention in order to improve the following deficits and impairments:  Decreased activity tolerance, Decreased range of motion, Hypomobility, Impaired perceived functional ability, Impaired UE functional use, Improper body mechanics, Pain, Decreased mobility  Visit Diagnosis: Stiffness of right shoulder, not elsewhere classified  Muscle weakness (generalized)  Acute pain of right shoulder  Acute pain of left shoulder     Problem List Patient Active Problem List   Diagnosis Date Noted   Adhesive capsulitis of right shoulder    Complete tear of right rotator cuff    Degenerative superior labral anterior-to-posterior (SLAP) tear of right shoulder    Healthcare maintenance 07/31/2021   Congenital pes cavus 02/25/2021   History of uterine fibroid 01/05/2021   Herpes 07/12/2020   GERD (gastroesophageal reflux disease) 05/23/2020   Vaginal atrophy 05/23/2020   Cervical spine arthritis 05/23/2020   Osteopenia 05/23/2020   Insomnia 05/20/2020    Postmenopausal hormone therapy 05/20/2020    MJanna Arch PT, DPT  12/05/2021, 11:17 AM  [place] [address] [address]Phone: [phone]  Fax:  [phone] Name: Emma KenanMRN: [id number] of Birth: 809-03-1961

## 2021-12-06 ENCOUNTER — Other Ambulatory Visit: Payer: Self-pay | Admitting: Surgical

## 2021-12-06 ENCOUNTER — Ambulatory Visit: Payer: 59 | Admitting: Physical Therapy

## 2021-12-06 ENCOUNTER — Other Ambulatory Visit: Payer: Self-pay

## 2021-12-06 DIAGNOSIS — M25512 Pain in left shoulder: Secondary | ICD-10-CM | POA: Diagnosis not present

## 2021-12-06 DIAGNOSIS — M25611 Stiffness of right shoulder, not elsewhere classified: Secondary | ICD-10-CM | POA: Diagnosis not present

## 2021-12-06 DIAGNOSIS — M6281 Muscle weakness (generalized): Secondary | ICD-10-CM | POA: Diagnosis not present

## 2021-12-06 DIAGNOSIS — M25511 Pain in right shoulder: Secondary | ICD-10-CM | POA: Diagnosis not present

## 2021-12-06 DIAGNOSIS — S46011D Strain of muscle(s) and tendon(s) of the rotator cuff of right shoulder, subsequent encounter: Secondary | ICD-10-CM

## 2021-12-06 MED ORDER — TRAMADOL HCL 50 MG PO TABS
50.0000 mg | ORAL_TABLET | Freq: Every day | ORAL | 0 refills | Status: DC | PRN
  Filled 2021-12-06 – 2021-12-09 (×3): qty 30, 30d supply, fill #0

## 2021-12-06 MED ORDER — METHOCARBAMOL 500 MG PO TABS
500.0000 mg | ORAL_TABLET | Freq: Three times a day (TID) | ORAL | 1 refills | Status: DC | PRN
  Filled 2021-12-06: qty 30, 10d supply, fill #0
  Filled 2021-12-13: qty 30, 10d supply, fill #1

## 2021-12-06 MED FILL — Methocarbamol Tab 500 MG: ORAL | 10 days supply | Qty: 30 | Fill #0 | Status: CN

## 2021-12-06 NOTE — Therapy (Signed)
[place] MAIN [facility] SERVICES [address] [place], [place], [address] Phone: [phone]   Fax:  [phone]  Physical Therapy Treatment  Patient Details  Name: [name] MRN: [id number] Date of Birth: Apr 24, 1961 No data recorded  Encounter Date: 12/06/2021   PT End of Session - 12/06/21 0944     Visit Number 18    Number of Visits 20    Date for PT Re-Evaluation 12/11/21    Progress Note Due on Visit 20    PT Start Time 0846    PT Stop Time 0931    PT Time Calculation (min) 45 min    Activity Tolerance Patient limited by pain;Patient tolerated treatment well    Behavior During Therapy [facility] for tasks assessed/performed             Past Medical History:  Diagnosis Date   Complication of anesthesia    PONV (postoperative nausea and vomiting)     Past Surgical History:  Procedure Laterality Date   AUGMENTATION MAMMAPLASTY Bilateral 2010   BICEPT TENODESIS Right 09/24/2021   Procedure: BICEPS TENODESIS;  Surgeon: [name], MD;  Location: [place];  Service: Orthopedics;  Laterality: Right;   CERVICAL SPINE SURGERY  2003   C5-C7- double dissectomy   PAROTID GLAND TUMOR EXCISION     SHOULDER ARTHROSCOPY Right 09/24/2021   Procedure: right shoulder arthroscopy, debridement,open rotator cuff tear repair;  Surgeon: [name], MD;  Location: [place];  Service: Orthopedics;  Laterality: Right;   WISDOM TOOTH EXTRACTION Bilateral     There were no vitals filed for this visit.   Subjective Assessment - 12/06/21 0905     Subjective Pt reports has been doing well since yesterday. WIs having some discomfort in the shoulders bilaterally and has increased pain in the left shoulder this AM following reaching for handbag earlier this AM. No other significant changes since last session.    Pertinent History Pt is a 60 y/o female s/p right shoulder arthroscopy with superior labral debridement, biceps tendon release, subacromial  decompression, mini open rotator cuff tear repair and biceps tenodesis - surgery preformed on 09/24/21. She had a follow-up appt with Dr. [name] on 10/26 - sling was removed this date. MD encourages wearing sling at night, otherwise encourages leaving sling off during the day. Shoulder injury occured in March 2022 - she was using a scooter due to an ankle injury. She fell off of the scooter and onto her right shoulder. 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 Of note, pt also reports C5-C7 anterior fusion in 2003.    Limitations Writing;House hold activities;Lifting;Other (comment);Walking;Standing    How long can you sit comfortably? WFL    How long can you stand comfortably? WFL w/ RUE supported    How long can you walk comfortably? 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 Yes    Pain Score 4     Pain Location Shoulder    Pain Orientation Right;Left    Pain Descriptors / Indicators Aching    Pain Onset More than a month ago             Exercise/Activity Sets/Reps/Time/ Resistance Assistance Charge type Comments  Scalene stretch  2 x 30 sec   Therex Cues for proper movement and  hold times   Upper trap stretch (Left)  2 x 30 sec   Therex Cues for proper movement pattern   Isometric internal rotation  10x 5 sec   Therex Cues for positioning of shoulder in neutral   Isometric External rotation  10 x 5 sec   therex Cues for positioning of shoulder in neutral     Isometric extension  10 x 5 sec   Therex  Cues for positioning of shoulder in neutral     Rhythmic isometrics (ER and IR)  2 x 45 sec  PT provided resistance Therex  Pt shoulde rin 45 ABD and alternating pressure in various area of wrist for rhythmic activation or RTC musculature   PROM with end range holds- ER, flex, abduction- R shoulder  9 min  PT performed Manual Pt in  supine this session. Increase in ER following manual techniques below. ER 28 degrees in 45 degrees ABD   STM and cross friction massage to L biceps and deltoid 3 min   Manual  Multiple trigger points noted in biceps muscle and deltoid muscle. Improvement in tightness in ER Rom following                     Treatment Provided this session   Pt educated throughout session about proper posture and technique with exercises. Improved exercise technique, movement at target joints, use of target muscles after min to mod verbal, visual, tactile cues. Note: Portions of this document were prepared using Dragon voice recognition software and although reviewed may contain unintentional dictation errors in syntax, grammar, or spelling.   Positive Adson's test potentially indicative of scalene and other neck and shoulder musculature tightness potentially causing n/t in L UE.                          PT Education - 12/06/21 0943     Education Details Isometrics for tright shoulder strengthening, neck stretches (scalene and upper trap) for L UE    Person(s) Educated Patient    Methods Explanation;Demonstration;Handout    Comprehension Verbalized understanding;Returned demonstration;Verbal cues required              PT Short Term Goals - 12/03/21 0900       PT SHORT TERM GOAL #1   Title Pt will be independent with HEP in order to improve strength and decrease pain in order to improve pain-free function at home and work.    Baseline 11/18: pt reports compliance with HEP, HEP to be further advanced as per protocol, 12/13: still doing regularly, no issues    Time 2    Period Weeks    Status On-going    Target Date 11/27/21               PT Long Term Goals - 12/05/21 1055       PT LONG TERM GOAL #1   Title Patient will improve right shoulder PROM to > 140 degrees of flexion, scaption, and abduction for improved ability to perform overhead activities.    Baseline  10/18/21: flexion 41*, abd 60*; 11/18: flexoin 96 deg, abduction 81 deg, scaption 104 deg, ER 14 deg (following interventions), 12/13: see flowsheet    Time 4    Period Weeks    Status Partially Met    Target Date 12/11/21      PT LONG TERM GOAL #2   Title Patient will improve right shoulder external rotation in order  to perform hygiene/ADL's such as hair care pain-free.    Baseline 10/18/21: ER 0*; 11/18: ER 14 deg, pain limited, 12/13: see flowsheet    Time 4    Period Weeks    Status Partially Met    Target Date 12/11/21      PT LONG TERM GOAL #3   Title Pt will decrease quick DASH score by at least 8% in order to demonstrate clinically significant reduction in disability.    Baseline 10/18/21: 80% 11/18: 70.45%    Time 4    Period Weeks    Status Achieved      PT LONG TERM GOAL #4   Title Pt will decrease worst pain as reported on NPRS by at least 3 points in order to demonstrate clinically significant reduction in pain    Baseline 10/18/21: 10/10 pain; 11/18: 6-7/10,    Time 4    Period Weeks    Status Achieved      PT LONG TERM GOAL #5   Title Patient will increase FOTO score to equal to or greater than 55 to demonstrate statistically significant improvement in mobility and quality of life.    Baseline 10/18/21: 20%; 11/18: 32%, 12/13: deferred    Time 4    Period Weeks    Status Partially Met    Target Date 12/11/21      Additional Long Term Goals   Additional Long Term Goals Yes      PT LONG TERM GOAL #6   Title Pt will decrease quick DASH score by at least 8% in order to demonstrate clinically significant reduction in disability.    Baseline 11/18: 70%, 12/13: 54.5%    Time 4    Period Weeks    Status Achieved    Target Date 12/11/21      PT LONG TERM GOAL #7   Title Patient will decrease pain VAS to <3/10 for L shoulder to improve quality of life and allow patient to sleep through the night.    Baseline 12/15: 6/10    Time 4    Period Weeks    Status New     Target Date 12/11/21                   Plan - 12/06/21 0945     Clinical Impression Statement Patient presents with continued excellent motivation for completion of physical therapy treatment program.  Patient did have positive Adson's test extremity indicative of some nerve impingement in her shoulder region.  Patient provided with stretches to help alleviate this.  Patient also initiated isometric strengthening external rotation internal rotation and extension of the right shoulder.  Patient tolerated well without any signs of pain.  Patient provided handout displaying this exercise as well as handout for upper trap and scalene stretches this session.  Patient also demonstrated 28 degrees external rotation following manual therapy and stretching indicating continued improvement in right upper extremity range of motion progress.  Patient will continue to benefit from skilled physical therapy intervention in order to improve her shoulder range of motion, improve shoulder mobility, improve her pain and improve her overall function in her shoulders bilaterally.    Personal Factors and Comorbidities Age;Time since onset of injury/illness/exacerbation    Examination-Activity Limitations Bathing;Reach Overhead;Dressing;Hygiene/Grooming;Toileting;Lift;Sleep;Carry    Examination-Participation Restrictions Driving;Cleaning;Meal Prep;Community Activity;Laundry;Occupation;Shop    Stability/Clinical Decision Making Stable/Uncomplicated    Rehab Potential Good    PT Frequency 3x / week    PT Duration 4 weeks    PT  Treatment/Interventions ADLs/Self Care Home Management;Aquatic Therapy;Biofeedback;Electrical Stimulation;Cryotherapy;Iontophoresis 2m/ml Dexamethasone;Moist Heat;Traction;Ultrasound;DME Instruction;Functional mobility training;Therapeutic activities;Therapeutic exercise;Neuromuscular re-education;Patient/family education;Manual techniques;Scar mobilization;Passive range of motion;Dry  needling;Other (comment);Joint Manipulations    PT Next Visit Plan progress strengthening of R, pain reduction of L    PT Home Exercise Plan RUE AAROM - flexion, abduction, ER (using PVC) - no printout; no updates; 11/18 to be performed LUE only Access Code: [email or web address]; 11/22: green theraputty for R grip strength x2 min    Consulted and Agree with Plan of Care Patient             Patient will benefit from skilled therapeutic intervention in order to improve the following deficits and impairments:  Decreased activity tolerance, Decreased range of motion, Hypomobility, Impaired perceived functional ability, Impaired UE functional use, Improper body mechanics, Pain, Decreased mobility  Visit Diagnosis: Stiffness of right shoulder, not elsewhere classified  Muscle weakness (generalized)  Acute pain of right shoulder  Acute pain of left shoulder  Traumatic complete tear of right rotator cuff, subsequent encounter     Problem List Patient Active Problem List   Diagnosis Date Noted   Adhesive capsulitis of right shoulder    Complete tear of right rotator cuff    Degenerative superior labral anterior-to-posterior (SLAP) tear of right shoulder    Healthcare maintenance 07/31/2021   Congenital pes cavus 02/25/2021   History of uterine fibroid 01/05/2021   Herpes 07/12/2020   GERD (gastroesophageal reflux disease) 05/23/2020   Vaginal atrophy 05/23/2020   Cervical spine arthritis 05/23/2020   Osteopenia 05/23/2020   Insomnia 05/20/2020   Postmenopausal hormone therapy 05/20/2020    CParticia Lather PT 12/06/2021, 9:54 AM  CS[place] [address]Phone: [phone]  Fax:  [phone] Name: Emma GreenhalghMRN: [id number] of Birth: 809-Sep-1962

## 2021-12-09 ENCOUNTER — Ambulatory Visit: Payer: 59

## 2021-12-09 ENCOUNTER — Other Ambulatory Visit: Payer: Self-pay

## 2021-12-09 DIAGNOSIS — M25611 Stiffness of right shoulder, not elsewhere classified: Secondary | ICD-10-CM | POA: Diagnosis not present

## 2021-12-09 DIAGNOSIS — S46011D Strain of muscle(s) and tendon(s) of the rotator cuff of right shoulder, subsequent encounter: Secondary | ICD-10-CM | POA: Diagnosis not present

## 2021-12-09 DIAGNOSIS — M25511 Pain in right shoulder: Secondary | ICD-10-CM

## 2021-12-09 DIAGNOSIS — M6281 Muscle weakness (generalized): Secondary | ICD-10-CM

## 2021-12-09 DIAGNOSIS — M25512 Pain in left shoulder: Secondary | ICD-10-CM

## 2021-12-09 NOTE — Therapy (Signed)
[place] MAIN [facility] SERVICES [address] [place], [place], [address] Phone: [phone]   Fax:  [phone]  Physical Therapy Treatment  Patient Details  Name: [name] MRN: [id number] Date of Birth: Aug 08, 1961 No data recorded  Encounter Date: 12/09/2021   PT End of Session - 12/09/21 0807     Visit Number 19    Number of Visits 20    Date for PT Re-Evaluation 12/11/21    Progress Note Due on Visit 20    PT Start Time 0801    PT Stop Time 8786    PT Time Calculation (min) 43 min    Activity Tolerance Patient limited by pain;Patient tolerated treatment well    Behavior During Therapy [facility] for tasks assessed/performed             Past Medical History:  Diagnosis Date   Complication of anesthesia    PONV (postoperative nausea and vomiting)     Past Surgical History:  Procedure Laterality Date   AUGMENTATION MAMMAPLASTY Bilateral 2010   BICEPT TENODESIS Right 09/24/2021   Procedure: BICEPS TENODESIS;  Surgeon: [name], MD;  Location: [place];  Service: Orthopedics;  Laterality: Right;   CERVICAL SPINE SURGERY  2003   C5-C7- double dissectomy   PAROTID GLAND TUMOR EXCISION     SHOULDER ARTHROSCOPY Right 09/24/2021   Procedure: right shoulder arthroscopy, debridement,open rotator cuff tear repair;  Surgeon: [name], MD;  Location: [place];  Service: Orthopedics;  Laterality: Right;   WISDOM TOOTH EXTRACTION Bilateral     There were no vitals filed for this visit.   Subjective Assessment - 12/09/21 0805     Subjective Patient reports she just took her pain medication. Has been compliant with HEP.    Pertinent History Pt is a 60 y/o female s/p right shoulder arthroscopy with superior labral debridement, biceps tendon release, subacromial decompression, mini open rotator cuff tear repair and biceps tenodesis - surgery preformed on 09/24/21. She had a follow-up appt with Dr. [name] on 10/26 - sling was removed  this date. MD encourages wearing sling at night, otherwise encourages leaving sling off during the day. Shoulder injury occured in March 2022 - she was using a scooter due to an ankle injury. She fell off of the scooter and onto her right shoulder. 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 Of note, pt also reports C5-C7 anterior fusion in 2003.    Limitations Writing;House hold activities;Lifting;Other (comment);Walking;Standing    How long can you sit comfortably? WFL    How long can you stand comfortably? WFL w/ RUE supported    How long can you walk comfortably? 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 Yes    Pain Score 2     Pain Location Shoulder    Pain Orientation Right;Left    Pain Descriptors / Indicators Aching    Pain Type Chronic pain    Pain Onset More than a month ago    Pain Frequency Constant                     Treatment:  Manual: Supine position with heat applied  PT performed PROM to RUE with guarding noted; Patient had increased pain in right shoulder at end range with increased  guarding: flexion, abduction, IR, ER; towel under elbow for ER/IR 10x 10 second holds   Grade II PA/AP mobs to RUE shoulder 15 sec bouts x4 sets each Median nerve glide 10x LUE  Ulnar nerve glide 10x LUE   TherEx: AAROM with dowel: flexion (hold 3-5 seconds), chest press, alternating IR/ER (hold 3-5 seconds) 15x each direction Supine scapular punches 10x PNF pattern D1 and D2 with AAROM LUE and RUE  Sidelying ER RUE with towel under elbow 12x Sidelying abduction RUE with towel under elbow 10x 5 second holds   Standing Shoulder extension AROM 10 Seated dowel row 15x   Ice applied at end of session to R shoulder (non billed) 5 minutes   Pt educated throughout session about proper posture and technique with  exercises. Improved exercise technique, movement at target joints, use of target muscles after min to mod verbal, visual, tactile cues.  Patient has excellent motivation throughout session. She tolerates progressions of strengthening and ROM well with some pain noted. PNF patterning introduced and tolerated well. Progressive ROM continues to be limited by muscle guarding but is able to progress each session. Patient will continue to benefit from skilled physical therapy intervention in order to improve her shoulder range of motion, improve shoulder mobility, improve her pain and improve her overall function in her shoulders bilaterally.               PT Education - 12/09/21 0806     Education Details exercise technique, body mechanics    Person(s) Educated Patient    Methods Explanation;Demonstration;Tactile cues;Verbal cues    Comprehension Verbalized understanding;Returned demonstration;Verbal cues required;Tactile cues required              PT Short Term Goals - 12/03/21 0900       PT SHORT TERM GOAL #1   Title Pt will be independent with HEP in order to improve strength and decrease pain in order to improve pain-free function at home and work.    Baseline 11/18: pt reports compliance with HEP, HEP to be further advanced as per protocol, 12/13: still doing regularly, no issues    Time 2    Period Weeks    Status On-going    Target Date 11/27/21               PT Long Term Goals - 12/05/21 1055       PT LONG TERM GOAL #1   Title Patient will improve right shoulder PROM to > 140 degrees of flexion, scaption, and abduction for improved ability to perform overhead activities.    Baseline 10/18/21: flexion 41*, abd 60*; 11/18: flexoin 96 deg, abduction 81 deg, scaption 104 deg, ER 14 deg (following interventions), 12/13: see flowsheet    Time 4    Period Weeks    Status Partially Met    Target Date 12/11/21      PT LONG TERM GOAL #2   Title Patient will improve  right shoulder external rotation in order to perform hygiene/ADL's such as hair care pain-free.    Baseline 10/18/21: ER 0*; 11/18: ER 14 deg, pain limited, 12/13: see flowsheet    Time 4    Period Weeks    Status Partially Met    Target Date 12/11/21      PT LONG TERM GOAL #3   Title Pt will decrease quick DASH score by at least 8% in order to demonstrate clinically significant reduction in disability.    Baseline 10/18/21: 80% 11/18: 70.45%  Time 4    Period Weeks    Status Achieved      PT LONG TERM GOAL #4   Title Pt will decrease worst pain as reported on NPRS by at least 3 points in order to demonstrate clinically significant reduction in pain    Baseline 10/18/21: 10/10 pain; 11/18: 6-7/10,    Time 4    Period Weeks    Status Achieved      PT LONG TERM GOAL #5   Title Patient will increase FOTO score to equal to or greater than 55 to demonstrate statistically significant improvement in mobility and quality of life.    Baseline 10/18/21: 20%; 11/18: 32%, 12/13: deferred    Time 4    Period Weeks    Status Partially Met    Target Date 12/11/21      Additional Long Term Goals   Additional Long Term Goals Yes      PT LONG TERM GOAL #6   Title Pt will decrease quick DASH score by at least 8% in order to demonstrate clinically significant reduction in disability.    Baseline 11/18: 70%, 12/13: 54.5%    Time 4    Period Weeks    Status Achieved    Target Date 12/11/21      PT LONG TERM GOAL #7   Title Patient will decrease pain VAS to <3/10 for L shoulder to improve quality of life and allow patient to sleep through the night.    Baseline 12/15: 6/10    Time 4    Period Weeks    Status New    Target Date 12/11/21                   Plan - 12/09/21 6378     Clinical Impression Statement Patient has excellent motivation throughout session. She tolerates progressions of strengthening and ROM well with some pain noted. PNF patterning introduced and tolerated  well. Progressive ROM continues to be limited by muscle guarding but is able to progress each session. Patient will continue to benefit from skilled physical therapy intervention in order to improve her shoulder range of motion, improve shoulder mobility, improve her pain and improve her overall function in her shoulders bilaterally.    Personal Factors and Comorbidities Age;Time since onset of injury/illness/exacerbation    Examination-Activity Limitations Bathing;Reach Overhead;Dressing;Hygiene/Grooming;Toileting;Lift;Sleep;Carry    Examination-Participation Restrictions Driving;Cleaning;Meal Prep;Community Activity;Laundry;Occupation;Shop    Stability/Clinical Decision Making Stable/Uncomplicated    Rehab Potential Good    PT Frequency 3x / week    PT Duration 4 weeks    PT Treatment/Interventions ADLs/Self Care Home Management;Aquatic Therapy;Biofeedback;Electrical Stimulation;Cryotherapy;Iontophoresis 4mg /ml Dexamethasone;Moist Heat;Traction;Ultrasound;DME Instruction;Functional mobility training;Therapeutic activities;Therapeutic exercise;Neuromuscular re-education;Patient/family education;Manual techniques;Scar mobilization;Passive range of motion;Dry needling;Other (comment);Joint Manipulations    PT Next Visit Plan progress strengthening of R, pain reduction of L    PT Home Exercise Plan RUE AAROM - flexion, abduction, ER (using PVC) - no printout; no updates; 11/18 to be performed LUE only Access Code: [email or web address]; 11/22: green theraputty for R grip strength x2 min    Consulted and Agree with Plan of Care Patient             Patient will benefit from skilled therapeutic intervention in order to improve the following deficits and impairments:  Decreased activity tolerance, Decreased range of motion, Hypomobility, Impaired perceived functional ability, Impaired UE functional use, Improper body mechanics, Pain, Decreased mobility  Visit Diagnosis: Stiffness of right shoulder, not elsewhere  classified  Muscle weakness (generalized)  Acute  pain of right shoulder  Acute pain of left shoulder     Problem List Patient Active Problem List   Diagnosis Date Noted   Adhesive capsulitis of right shoulder    Complete tear of right rotator cuff    Degenerative superior labral anterior-to-posterior (SLAP) tear of right shoulder    Healthcare maintenance 07/31/2021   Congenital pes cavus 02/25/2021   History of uterine fibroid 01/05/2021   Herpes 07/12/2020   GERD (gastroesophageal reflux disease) 05/23/2020   Vaginal atrophy 05/23/2020   Cervical spine arthritis 05/23/2020   Osteopenia 05/23/2020   Insomnia 05/20/2020   Postmenopausal hormone therapy 05/20/2020   Janna Arch, PT, DPT  12/09/2021, 8:53 AM  [place] MAIN [facility] SERVICES [address] [place], [place], [address] Phone: [phone]   Fax:  [phone]  Name: Marche Hottenstein MRN: [id number] Date of Birth: 1961-04-01

## 2021-12-11 ENCOUNTER — Other Ambulatory Visit: Payer: Self-pay

## 2021-12-11 ENCOUNTER — Ambulatory Visit: Payer: 59 | Admitting: Physical Therapy

## 2021-12-11 ENCOUNTER — Encounter: Payer: Self-pay | Admitting: Physical Therapy

## 2021-12-11 DIAGNOSIS — M25512 Pain in left shoulder: Secondary | ICD-10-CM | POA: Diagnosis not present

## 2021-12-11 DIAGNOSIS — S46011D Strain of muscle(s) and tendon(s) of the rotator cuff of right shoulder, subsequent encounter: Secondary | ICD-10-CM | POA: Diagnosis not present

## 2021-12-11 DIAGNOSIS — M25611 Stiffness of right shoulder, not elsewhere classified: Secondary | ICD-10-CM | POA: Diagnosis not present

## 2021-12-11 DIAGNOSIS — M25511 Pain in right shoulder: Secondary | ICD-10-CM

## 2021-12-11 DIAGNOSIS — M6281 Muscle weakness (generalized): Secondary | ICD-10-CM

## 2021-12-11 NOTE — Therapy (Signed)
[place] MAIN [facility] SERVICES [address] [place], [place], [address] Phone: [phone]   Fax:  [phone]  Physical Therapy Treatment Physical Therapy Progress Note   Dates of reporting period  11/13/21   to   12/11/21   Patient Details  Name: [name] MRN: [id number] Date of Birth: 05-11-1961 No data recorded  Encounter Date: 12/11/2021   PT End of Session - 12/11/21 1200     Visit Number 20    Number of Visits 19    Date for PT Re-Evaluation 02/05/22    Authorization Type last recert/progress note 65/68    Progress Note Due on Visit 20    PT Start Time 1149    PT Stop Time 1230    PT Time Calculation (min) 41 min    Activity Tolerance Patient limited by pain;Patient tolerated treatment well    Behavior During Therapy [facility] for tasks assessed/performed             Past Medical History:  Diagnosis Date   Complication of anesthesia    PONV (postoperative nausea and vomiting)     Past Surgical History:  Procedure Laterality Date   AUGMENTATION MAMMAPLASTY Bilateral 2010   BICEPT TENODESIS Right 09/24/2021   Procedure: BICEPS TENODESIS;  Surgeon: [name], MD;  Location: [place];  Service: Orthopedics;  Laterality: Right;   CERVICAL SPINE SURGERY  2003   C5-C7- double dissectomy   PAROTID GLAND TUMOR EXCISION     SHOULDER ARTHROSCOPY Right 09/24/2021   Procedure: right shoulder arthroscopy, debridement,open rotator cuff tear repair;  Surgeon: [name], MD;  Location: [place];  Service: Orthopedics;  Laterality: Right;   WISDOM TOOTH EXTRACTION Bilateral     There were no vitals filed for this visit.   Subjective Assessment - 12/11/21 1156     Subjective Patient reports increased knotted feeling in bicep this morning (7/10 pain) but was likely from overdoing it making cookies. She did apply heat and use the ranger which helped;    Pertinent History Pt is a 60 y/o female s/p right shoulder  arthroscopy with superior labral debridement, biceps tendon release, subacromial decompression, mini open rotator cuff tear repair and biceps tenodesis - surgery preformed on 09/24/21. She had a follow-up appt with Dr. [name] on 10/26 - sling was removed this date. MD encourages wearing sling at night, otherwise encourages leaving sling off during the day. Shoulder injury occured in March 2022 - she was using a scooter due to an ankle injury. She fell off of the scooter and onto her right shoulder. 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 Of note, pt also reports C5-C7 anterior fusion in 2003.    Limitations Writing;House hold activities;Lifting;Other (comment);Walking;Standing    How long can you sit comfortably? WFL    How long can you stand comfortably? WFL w/ RUE supported    How long can you walk comfortably? 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 Yes    Pain Score 3     Pain Location Shoulder    Pain Orientation Right    Pain Descriptors / Indicators Aching    Pain Type Chronic pain    Pain Onset More than a month ago    Pain Frequency Constant    Aggravating Factors  lying supine/overuse    Pain Relieving Factors heat/meds    Effect of Pain on Daily Activities decreased UE use;                [facility] PT Assessment - 12/11/21 0001       Observation/Other Assessments   Focus on Therapeutic Outcomes (FOTO)  55%      PROM   Right Shoulder Flexion 124 Degrees    Right Shoulder ABduction 90 Degrees    Right Shoulder Internal Rotation 63 Degrees   supine with 20 degrees elevation; shoulder at 45 degrees abduction, avoiding scapular involvement   Right Shoulder External Rotation 24 Degrees   supine with 20 degrees elevation; shoulder at 45 degrees abduction               Treatment: In modified supine position at  20 degrees heat pad placed over and post to RUE:  PT performed PROM to RUE with guarding noted; Patient had increased pain in right shoulder at end range with increased guarding  PT identified increased tightness along biceps and subscapularis PT performed soft tissue massage using edge tool for IASTM to help reduce tightness x24 min; Patient tolerated well reporting less soreness and exhibiting better tissue extensibility   PT performed PROM of RUE in all directions to tolerance;  PT assessed goals including PROM, see below    Patient tolerated session well. She is progressing with shoulder ROM, although continues to be limited in shoulder ER and abduction;  Patient's condition has the potential to improve in response to therapy. Maximum improvement is yet to be obtained. The anticipated improvement is attainable and reasonable in a generally predictable time.  Patient reports adherence with HEP and is hoping to progress strengthening;                         PT Short Term Goals - 12/03/21 0900       PT SHORT TERM GOAL #1   Title Pt will be independent with HEP in order to improve strength and decrease pain in order to improve pain-free function at home and work.    Baseline 11/18: pt reports compliance with HEP, HEP to be further advanced as per protocol, 12/13: still doing regularly, no issues    Time 2    Period Weeks    Status On-going    Target Date 11/27/21               PT Long Term Goals - 12/11/21 1224       PT LONG TERM GOAL #1   Title Patient will improve right shoulder PROM to > 140 degrees of flexion, scaption, and abduction for improved ability to perform overhead activities.    Baseline 10/18/21: flexion 41*, abd 60*; 11/18: flexoin 96 deg, abduction 81 deg, scaption 104 deg, ER 14 deg (following interventions), 12/13: see flowsheet, 12/21: see flowsheet    Time 8    Period Weeks    Status Partially Met    Target Date 02/05/22      PT  LONG TERM GOAL #2   Title Patient will improve right shoulder external rotation in order to perform hygiene/ADL's such as hair care pain-free.    Baseline 10/18/21: ER 0*; 11/18: ER 14 deg, pain limited, 12/13: see flowsheet, 12/21: see flowsheet    Time 8    Period Weeks    Status Partially Met    Target Date 02/05/22      PT  LONG TERM GOAL #3   Title Pt will decrease quick DASH score by at least 8% in order to demonstrate clinically significant reduction in disability.    Baseline 10/18/21: 80% 11/18: 70.45%, 12/21: deferred    Time 8    Period Weeks    Status Achieved    Target Date 02/05/22      PT LONG TERM GOAL #4   Title Pt will decrease worst pain as reported on NPRS by at least 3 points in order to demonstrate clinically significant reduction in pain    Baseline 10/18/21: 10/10 pain; 11/18: 6-7/10,    Time 8    Period Weeks    Status Achieved    Target Date 02/05/22      PT LONG TERM GOAL #5   Title Patient will increase FOTO score to equal to or greater than 55 to demonstrate statistically significant improvement in mobility and quality of life.    Baseline 10/18/21: 20%; 11/18: 32%, 12/13: deferred, 12/21: 55%    Time 8    Period Weeks    Status Achieved    Target Date 02/05/22      PT LONG TERM GOAL #6   Title Pt will decrease quick DASH score by at least 8% in order to demonstrate clinically significant reduction in disability.    Baseline 11/18: 70%, 12/13: 54.5%    Time 8    Period Weeks    Status Achieved    Target Date 02/05/22      PT LONG TERM GOAL #7   Title Patient will decrease pain VAS to <3/10 for L shoulder to improve quality of life and allow patient to sleep through the night.    Baseline 12/15: 6/10,    Time 8    Period Weeks    Status New    Target Date 02/05/22                   Plan - 12/11/21 1234     Clinical Impression Statement Patient motivated and participated well within session. She presents with increased  tightness/tenderness to RUE shoulder. She reports possibly overdoing it last night making cookies and her shoulder just spasmed. PT performed extensive manual therapy to help alleviate tightness and soreness. Patient tolerated well. Educated patient in ways to help reduce tightness at home including soft tissue massage and ice massage. Patient verbalized understanding. She is progressing well with ROM, however is still limited in shoulder abduction and ER. Recent visit from MD and updated protocol allows for progression of UE strengthening. patient would benefit from additional skilled PT Intervention to improve strength, ROM and return to PLOF. Patient has an anticipated return to work date of 01/13/22    Personal Factors and Comorbidities Age;Time since onset of injury/illness/exacerbation    Examination-Activity Limitations Bathing;Reach Overhead;Dressing;Hygiene/Grooming;Toileting;Lift;Sleep;Carry    Examination-Participation Restrictions Driving;Cleaning;Meal Prep;Community Activity;Laundry;Occupation;Shop    Stability/Clinical Decision Making Stable/Uncomplicated    Rehab Potential Good    PT Frequency 3x / week    PT Duration 4 weeks    PT Treatment/Interventions ADLs/Self Care Home Management;Aquatic Therapy;Biofeedback;Electrical Stimulation;Cryotherapy;Iontophoresis 35m/ml Dexamethasone;Moist Heat;Traction;Ultrasound;DME Instruction;Functional mobility training;Therapeutic activities;Therapeutic exercise;Neuromuscular re-education;Patient/family education;Manual techniques;Scar mobilization;Passive range of motion;Dry needling;Other (comment);Joint Manipulations    PT Next Visit Plan progress strengthening of R, pain reduction of L    PT Home Exercise Plan RUE AAROM - flexion, abduction, ER (using PVC) - no printout; no updates; 11/18 to be performed LUE only Access Code: [email or web address]; 11/22: green theraputty for R grip strength x2 min  Consulted and Agree with Plan of Care Patient              Patient will benefit from skilled therapeutic intervention in order to improve the following deficits and impairments:  Decreased activity tolerance, Decreased range of motion, Hypomobility, Impaired perceived functional ability, Impaired UE functional use, Improper body mechanics, Pain, Decreased mobility  Visit Diagnosis: Stiffness of right shoulder, not elsewhere classified  Muscle weakness (generalized)  Acute pain of right shoulder  Acute pain of left shoulder     Problem List Patient Active Problem List   Diagnosis Date Noted   Adhesive capsulitis of right shoulder    Complete tear of right rotator cuff    Degenerative superior labral anterior-to-posterior (SLAP) tear of right shoulder    Healthcare maintenance 07/31/2021   Congenital pes cavus 02/25/2021   History of uterine fibroid 01/05/2021   Herpes 07/12/2020   GERD (gastroesophageal reflux disease) 05/23/2020   Vaginal atrophy 05/23/2020   Cervical spine arthritis 05/23/2020   Osteopenia 05/23/2020   Insomnia 05/20/2020   Postmenopausal hormone therapy 05/20/2020    Trotter,Margaret, PT, DPT 12/11/2021, 12:38 PM  [place] MAIN [facility] SERVICES [address] [place], [place], [address] Phone: [phone]   Fax:  [phone]  Name: Edmonia Gonser MRN: [id number] Date of Birth: [birth year]

## 2021-12-13 ENCOUNTER — Ambulatory Visit: Payer: 59

## 2021-12-13 ENCOUNTER — Other Ambulatory Visit: Payer: Self-pay

## 2021-12-13 DIAGNOSIS — M25512 Pain in left shoulder: Secondary | ICD-10-CM | POA: Diagnosis not present

## 2021-12-13 DIAGNOSIS — M25511 Pain in right shoulder: Secondary | ICD-10-CM | POA: Diagnosis not present

## 2021-12-13 DIAGNOSIS — S46011D Strain of muscle(s) and tendon(s) of the rotator cuff of right shoulder, subsequent encounter: Secondary | ICD-10-CM | POA: Diagnosis not present

## 2021-12-13 DIAGNOSIS — M6281 Muscle weakness (generalized): Secondary | ICD-10-CM

## 2021-12-13 DIAGNOSIS — M25611 Stiffness of right shoulder, not elsewhere classified: Secondary | ICD-10-CM | POA: Diagnosis not present

## 2021-12-13 NOTE — Therapy (Signed)
[place] MAIN [facility] SERVICES [address] [place], [place], [address] Phone: [phone]   Fax:  [phone]  Physical Therapy Treatment  Patient Details  Name: [name] MRN: [id number] Date of Birth: 1961/06/15 No data recorded  Encounter Date: 12/13/2021   PT End of Session - 12/13/21 0948     Visit Number 21    Number of Visits 44    Date for PT Re-Evaluation 02/05/22    Authorization Type last recert/progress note 12/87    Progress Note Due on Visit 20    PT Start Time 0805    PT Stop Time 0848    PT Time Calculation (min) 43 min    Activity Tolerance Patient tolerated treatment well    Behavior During Therapy [facility] for tasks assessed/performed             Past Medical History:  Diagnosis Date   Complication of anesthesia    PONV (postoperative nausea and vomiting)     Past Surgical History:  Procedure Laterality Date   AUGMENTATION MAMMAPLASTY Bilateral 2010   BICEPT TENODESIS Right 09/24/2021   Procedure: BICEPS TENODESIS;  Surgeon: [name], MD;  Location: [place];  Service: Orthopedics;  Laterality: Right;   CERVICAL SPINE SURGERY  2003   C5-C7- double dissectomy   PAROTID GLAND TUMOR EXCISION     SHOULDER ARTHROSCOPY Right 09/24/2021   Procedure: right shoulder arthroscopy, debridement,open rotator cuff tear repair;  Surgeon: [name], MD;  Location: [place];  Service: Orthopedics;  Laterality: Right;   WISDOM TOOTH EXTRACTION Bilateral     There were no vitals filed for this visit.   Subjective Assessment - 12/13/21 0805     Subjective Pt reports LUE is improving and that her pain has resolved. She reports her hand still does go to sleep. Pt reports L shoulder is stiff in the mornings. Pt reports if she goes to reach overhead it hurts in L shoulder joint. Pt reports she woke up with spasm in R bicep after making cookies the other day.    Pertinent History Pt is a 60 y/o female s/p right  shoulder arthroscopy with superior labral debridement, biceps tendon release, subacromial decompression, mini open rotator cuff tear repair and biceps tenodesis - surgery preformed on 09/24/21. She had a follow-up appt with Dr. [name] on 10/26 - sling was removed this date. MD encourages wearing sling at night, otherwise encourages leaving sling off during the day. Shoulder injury occured in March 2022 - she was using a scooter due to an ankle injury. She fell off of the scooter and onto her right shoulder. 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 Of note, pt also reports C5-C7 anterior fusion in 2003.    Limitations Writing;House hold activities;Lifting;Other (comment);Walking;Standing    How long can you sit comfortably? WFL    How long can you stand comfortably? WFL w/ RUE supported    How long can you walk comfortably? 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 Yes    Pain Location Shoulder    Pain Orientation Right    Pain Onset More than a month ago           INTERVENTIONS   Manual: Reclined position with heat applied to R shoulder PT performed PROM  to RUE all planes with continued guarding noted, particularly with flexion: flexion, abduction, IR, ER; towel under elbow.. Increased focus on ER and flexion, held at pain-free end range for multiple bouts of 30 sec.  PT also provides STM and TrP release around R and L bicipital groove as pt TTP in these areas. Pt responds well to heat and all manual therapy without increased pain. Additional PT provides PROM into R shoulder flexion combined with horizontal adduction and IR to assist pt with personal goal of reaching top of head. Pt held in this position at pain-free end range for bouts of 20-30 sec.   TherEx:   PVC pipe flexion 10x; cuing for decreased speed with eccentric  component PVC pipe chest press 15x; pt reports as fatiguing PVC ER RUE (seated) 25x; pt exhibits improved ROM  Seated pball rollouts forward/backward 12x with 3-5 sec hold at pain-free end range Seated pball rollouts side-to-side 10x with 3-5 sec hold  Seated LUE ulnar nerve glide - 10x; pt reports some numbness felt  R shoulder isometrics with towel roll into wall: flexion, extension, IR/ER 10x for each held for 3 sec per rep.  Wall ladder RUE flexion and abduction x2 rounds of each.  Additional manual therapy at end of session x4 minutes for R bicep TrP release and PROM shoulder ER.    Pt educated throughout session about proper posture and technique with exercises. Improved exercise technique, movement at target joints, use of target muscles after min to mod verbal, visual, tactile cues.  PT Education - 12/13/21 0948     Education Details exercise technique, body mechanics    Person(s) Educated Patient    Methods Explanation;Demonstration;Tactile cues;Verbal cues    Comprehension Verbalized understanding;Returned demonstration              PT Short Term Goals - 12/03/21 0900       PT SHORT TERM GOAL #1   Title Pt will be independent with HEP in order to improve strength and decrease pain in order to improve pain-free function at home and work.    Baseline 11/18: pt reports compliance with HEP, HEP to be further advanced as per protocol, 12/13: still doing regularly, no issues    Time 2    Period Weeks    Status On-going    Target Date 11/27/21               PT Long Term Goals - 12/11/21 1224       PT LONG TERM GOAL #1   Title Patient will improve right shoulder PROM to > 140 degrees of flexion, scaption, and abduction for improved ability to perform overhead activities.    Baseline 10/18/21: flexion 41*, abd 60*; 11/18: flexoin 96 deg, abduction 81 deg, scaption 104 deg, ER 14 deg (following interventions), 12/13: see flowsheet, 12/21: see flowsheet    Time 8     Period Weeks    Status Partially Met    Target Date 02/05/22      PT LONG TERM GOAL #2   Title Patient will improve right shoulder external rotation in order to perform hygiene/ADL's such as hair care pain-free.    Baseline 10/18/21: ER 0*; 11/18: ER 14 deg, pain limited, 12/13: see flowsheet, 12/21: see flowsheet    Time 8    Period Weeks    Status Partially Met    Target Date 02/05/22      PT LONG TERM GOAL #3   Title Pt will decrease quick DASH score  by at least 8% in order to demonstrate clinically significant reduction in disability.    Baseline 10/18/21: 80% 11/18: 70.45%, 12/21: deferred    Time 8    Period Weeks    Status Achieved    Target Date 02/05/22      PT LONG TERM GOAL #4   Title Pt will decrease worst pain as reported on NPRS by at least 3 points in order to demonstrate clinically significant reduction in pain    Baseline 10/18/21: 10/10 pain; 11/18: 6-7/10,    Time 8    Period Weeks    Status Achieved    Target Date 02/05/22      PT LONG TERM GOAL #5   Title Patient will increase FOTO score to equal to or greater than 55 to demonstrate statistically significant improvement in mobility and quality of life.    Baseline 10/18/21: 20%; 11/18: 32%, 12/13: deferred, 12/21: 55%    Time 8    Period Weeks    Status Achieved    Target Date 02/05/22      PT LONG TERM GOAL #6   Title Pt will decrease quick DASH score by at least 8% in order to demonstrate clinically significant reduction in disability.    Baseline 11/18: 70%, 12/13: 54.5%    Time 8    Period Weeks    Status Achieved    Target Date 02/05/22      PT LONG TERM GOAL #7   Title Patient will decrease pain VAS to <3/10 for L shoulder to improve quality of life and allow patient to sleep through the night.    Baseline 12/15: 6/10,    Time 8    Period Weeks    Status New    Target Date 02/05/22                   Plan - 12/13/21 0949     Clinical Impression Statement Pt highly motivated  to participate in session. The pt responds well to interventions without reports of increased pain and is able to perform several reps of AAROM and isometric exercises. However, pt does report quick onset of fatigue in RUE with PVC pipe interventions, indicating decreased RUE endurance. The pt will benefit from further skilled PT to improve strength, ROM in order for pt to return to PLOF.    Personal Factors and Comorbidities Age;Time since onset of injury/illness/exacerbation    Examination-Activity Limitations Bathing;Reach Overhead;Dressing;Hygiene/Grooming;Toileting;Lift;Sleep;Carry    Examination-Participation Restrictions Driving;Cleaning;Meal Prep;Community Activity;Laundry;Occupation;Shop    Stability/Clinical Decision Making Stable/Uncomplicated    Rehab Potential Good    PT Frequency 3x / week    PT Duration 8 weeks    PT Treatment/Interventions ADLs/Self Care Home Management;Aquatic Therapy;Biofeedback;Electrical Stimulation;Cryotherapy;Iontophoresis 4mg /ml Dexamethasone;Moist Heat;Traction;Ultrasound;DME Instruction;Functional mobility training;Therapeutic activities;Therapeutic exercise;Neuromuscular re-education;Patient/family education;Manual techniques;Scar mobilization;Passive range of motion;Dry needling;Other (comment);Joint Manipulations    PT Next Visit Plan progress strengthening of R, pain reduction of L    PT Home Exercise Plan RUE AAROM - flexion, abduction, ER (using PVC) - no printout; no updates; 11/18 to be performed LUE only Access Code: [email or web address]; 11/22: green theraputty for R grip strength x2 min    Consulted and Agree with Plan of Care Patient             Patient will benefit from skilled therapeutic intervention in order to improve the following deficits and impairments:  Decreased activity tolerance, Decreased range of motion, Hypomobility, Impaired perceived functional ability, Impaired UE functional use, Improper body mechanics, Pain, Decreased mobility  Visit  Diagnosis: Muscle weakness (generalized)  Stiffness of right shoulder, not elsewhere classified  Acute pain of left shoulder     Problem List Patient Active Problem List   Diagnosis Date Noted   Adhesive capsulitis of right shoulder    Complete tear of right rotator cuff    Degenerative superior labral anterior-to-posterior (SLAP) tear of right shoulder    Healthcare maintenance 07/31/2021   Congenital pes cavus 02/25/2021   History of uterine fibroid 01/05/2021   Herpes 07/12/2020   GERD (gastroesophageal reflux disease) 05/23/2020   Vaginal atrophy 05/23/2020   Cervical spine arthritis 05/23/2020   Osteopenia 05/23/2020   Insomnia 05/20/2020   Postmenopausal hormone therapy 05/20/2020    Zollie Pee, PT 12/13/2021, 9:59 AM  [place] MAIN [facility] SERVICES [address] [place], [place], [address] Phone: [phone]   Fax:  [phone]  Name: Cerise Lieber MRN: [id number] Date of Birth: 1961-05-26

## 2021-12-18 ENCOUNTER — Other Ambulatory Visit: Payer: Self-pay

## 2021-12-18 ENCOUNTER — Encounter: Payer: Self-pay | Admitting: Physical Therapy

## 2021-12-18 ENCOUNTER — Ambulatory Visit: Payer: 59 | Admitting: Physical Therapy

## 2021-12-18 DIAGNOSIS — M25611 Stiffness of right shoulder, not elsewhere classified: Secondary | ICD-10-CM | POA: Diagnosis not present

## 2021-12-18 DIAGNOSIS — M6281 Muscle weakness (generalized): Secondary | ICD-10-CM | POA: Diagnosis not present

## 2021-12-18 DIAGNOSIS — M25511 Pain in right shoulder: Secondary | ICD-10-CM | POA: Diagnosis not present

## 2021-12-18 DIAGNOSIS — M25512 Pain in left shoulder: Secondary | ICD-10-CM | POA: Diagnosis not present

## 2021-12-18 DIAGNOSIS — S46011D Strain of muscle(s) and tendon(s) of the rotator cuff of right shoulder, subsequent encounter: Secondary | ICD-10-CM | POA: Diagnosis not present

## 2021-12-18 NOTE — Therapy (Signed)
[place] MAIN [facility] SERVICES [address] [place], [place], [address] Phone: [phone]   Fax:  [phone]  Physical Therapy Treatment  Patient Details  Name: [name] MRN: [id number] Date of Birth: 08-26-1961 No data recorded  Encounter Date: 12/18/2021   PT End of Session - 12/18/21 0809     Visit Number 22    Number of Visits 44    Date for PT Re-Evaluation 02/05/22    Authorization Type last recert/progress note 16/24    Progress Note Due on Visit 20    PT Start Time 0805    PT Stop Time 0850    PT Time Calculation (min) 45 min    Activity Tolerance Patient tolerated treatment well    Behavior During Therapy [facility] for tasks assessed/performed             Past Medical History:  Diagnosis Date   Complication of anesthesia    PONV (postoperative nausea and vomiting)     Past Surgical History:  Procedure Laterality Date   AUGMENTATION MAMMAPLASTY Bilateral 2010   BICEPT TENODESIS Right 09/24/2021   Procedure: BICEPS TENODESIS;  Surgeon: [name], MD;  Location: [place];  Service: Orthopedics;  Laterality: Right;   CERVICAL SPINE SURGERY  2003   C5-C7- double dissectomy   PAROTID GLAND TUMOR EXCISION     SHOULDER ARTHROSCOPY Right 09/24/2021   Procedure: right shoulder arthroscopy, debridement,open rotator cuff tear repair;  Surgeon: [name], MD;  Location: [place];  Service: Orthopedics;  Laterality: Right;   WISDOM TOOTH EXTRACTION Bilateral     There were no vitals filed for this visit.   Subjective Assessment - 12/18/21 0807     Subjective Pt reports her RUE shoulder is stiff and sore this morning. No new complaints.    Pertinent History Pt is a 60 y/o female s/p right shoulder arthroscopy with superior labral debridement, biceps tendon release, subacromial decompression, mini open rotator cuff tear repair and biceps tenodesis - surgery preformed on 09/24/21. She had a follow-up appt with Dr. [name]  on 10/26 - sling was removed this date. MD encourages wearing sling at night, otherwise encourages leaving sling off during the day. Shoulder injury occured in March 2022 - she was using a scooter due to an ankle injury. She fell off of the scooter and onto her right shoulder. 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 Of note, pt also reports C5-C7 anterior fusion in 2003.    Limitations Writing;House hold activities;Lifting;Other (comment);Walking;Standing    How long can you sit comfortably? WFL    How long can you stand comfortably? WFL w/ RUE supported    How long can you walk comfortably? 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 Yes    Pain Score 3     Pain Location Shoulder    Pain Orientation Right    Pain Descriptors / Indicators Aching;Sore;Tightness    Pain Type Chronic pain    Pain Onset More than a month ago    Pain Frequency Constant    Aggravating Factors  lying supine/overuse    Pain Relieving Factors heat/meds    Effect of Pain on Daily Activities decreased UE use    Multiple Pain Sites No  INTERVENTIONS   PT instructed patient in RUE shoulder AAROM on UE ranger Flexion/extension Circles X1-2 min each  Standing RUE elbow curls 2# x12 reps with cues to slow down UE movement for better motor control and strengthening  Patient still doing isometric exercise with good  control;   Patient in left sidelying Instructed patient in resisted scapular movement: Scapular elevation/depression against therapist manual resistance x10 reps Scapular retraction/protraction against therapist resistance x10 reps Required min VCs for proper positioning. Patient did have difficulty depressing scapulae either from tightness or weakness;  Patient supine RUE scapular protraction 1# 2x10 with  therapist cues to keep elbow straight and avoid painful ROM;    Manual: Supine on mat table with heat applied to R shoulder PT performed PROM to RUE all planes with continued guarding noted, particularly with flexion: flexion, abduction, IR, ER; towel under elbow.. Increased focus on ER and flexion, held at pain-free end range for multiple bouts of 30 sec.  PT also provides STM and TrP release around R and L bicipital groove as pt TTP in these areas. Pt responds well to heat and all manual therapy without increased pain.   TherEx:    PVC pipe flexion 10x; cuing for decreased speed with eccentric component PVC pipe chest press 10x; pt reports as fatiguing PVC ER RUE (supine) 10x; pt exhibits improved ROM   Shoulder ROM in supine Flexion 130 degrees ER: 23 degrees at 70 degrees abduction     Pt educated throughout session about proper posture and technique with exercises. Improved exercise technique, movement at target joints, use of target muscles after min to mod verbal, visual, tactile cues.   Finished with cryotherapy to right shoulder in sitting x5 min (unbilled);  Patient tolerated well. She reports less stiffness at end of session. 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    Baseline 11/18: pt reports compliance with HEP, HEP to be further advanced as per protocol, 12/13: still doing regularly, no issues    Time 2    Period Weeks     Status On-going    Target Date 11/27/21               PT Long Term Goals - 12/11/21 1224       PT LONG TERM GOAL #1   Title Patient will improve right shoulder PROM to > 140 degrees of flexion, scaption, and abduction for improved ability to perform overhead activities.    Baseline 10/18/21: flexion 41*, abd 60*; 11/18: flexoin 96 deg, abduction 81 deg, scaption 104 deg, ER 14 deg (following interventions), 12/13: see flowsheet, 12/21: see flowsheet    Time 8    Period Weeks    Status Partially Met    Target Date 02/05/22      PT LONG TERM GOAL #2   Title Patient will improve  right shoulder external rotation in order to perform hygiene/ADL's such as hair care pain-free.    Baseline 10/18/21: ER 0*; 11/18: ER 14 deg, pain limited, 12/13: see flowsheet, 12/21: see flowsheet    Time 8    Period Weeks    Status Partially Met    Target Date 02/05/22      PT LONG TERM GOAL #3   Title Pt will decrease quick DASH score by at least 8% in order to demonstrate clinically significant reduction in disability.    Baseline 10/18/21: 80% 11/18: 70.45%, 12/21: deferred    Time 8    Period Weeks    Status Achieved    Target Date 02/05/22      PT LONG TERM GOAL #4   Title Pt will decrease worst pain as reported on NPRS by at least 3 points in order to demonstrate clinically significant reduction in pain    Baseline 10/18/21: 10/10 pain; 11/18: 6-7/10,    Time 8    Period Weeks    Status Achieved    Target Date 02/05/22      PT LONG TERM GOAL #5   Title Patient will increase FOTO score to equal to or greater than 55 to demonstrate statistically significant improvement in mobility and quality of life.    Baseline 10/18/21: 20%; 11/18: 32%, 12/13: deferred, 12/21: 55%    Time 8    Period Weeks    Status Achieved    Target Date 02/05/22      PT LONG TERM GOAL #6   Title Pt will decrease quick DASH score by at least 8% in order to demonstrate clinically significant reduction in  disability.    Baseline 11/18: 70%, 12/13: 54.5%    Time 8    Period Weeks    Status Achieved    Target Date 02/05/22      PT LONG TERM GOAL #7   Title Patient will decrease pain VAS to <3/10 for L shoulder to improve quality of life and allow patient to sleep through the night.    Baseline 12/15: 6/10,    Time 8    Period Weeks    Status New    Target Date 02/05/22                   Plan - 12/18/21 1000     Clinical Impression Statement Patient motivated and participated well within session. She was instructed in advanced AAROM and strengthening exercise. patient does require min Vcs for proper exercise technique and positioning. Instructed patient in scapular exercise. She did have difficulty with motor control and reports moderate fatigue/difficulty with exercise. She does report soreness/stiffness early this morning; Patient tolerated manual therapy well, tolerating increased PROM. She continues to be limited with shoulder abduction/ER. Patient would benefit from additional skilled PT intervention to improve strength, ROM and reduce pain with ADLs    Personal Factors and Comorbidities Age;Time since onset of injury/illness/exacerbation    Examination-Activity Limitations Bathing;Reach Overhead;Dressing;Hygiene/Grooming;Toileting;Lift;Sleep;Carry    Examination-Participation Restrictions Driving;Cleaning;Meal Prep;Community Activity;Laundry;Occupation;Shop    Stability/Clinical Decision Making Stable/Uncomplicated    Rehab Potential Good    PT Frequency 3x / week    PT Duration 8 weeks    PT Treatment/Interventions ADLs/Self Care Home Management;Aquatic Therapy;Biofeedback;Electrical Stimulation;Cryotherapy;Iontophoresis 5m/ml Dexamethasone;Moist Heat;Traction;Ultrasound;DME Instruction;Functional mobility training;Therapeutic activities;Therapeutic exercise;Neuromuscular re-education;Patient/family education;Manual techniques;Scar mobilization;Passive range of motion;Dry  needling;Other (comment);Joint Manipulations    PT Next Visit Plan progress strengthening of R, pain reduction of L    PT Home Exercise Plan RUE  AAROM - flexion, abduction, ER (using PVC) - no printout; no updates; 11/18 to be performed LUE only Access Code: [email or web address]; 11/22: green theraputty for R grip strength x2 min    Consulted and Agree with Plan of Care Patient             Patient will benefit from skilled therapeutic intervention in order to improve the following deficits and impairments:  Decreased activity tolerance, Decreased range of motion, Hypomobility, Impaired perceived functional ability, Impaired UE functional use, Improper body mechanics, Pain, Decreased mobility  Visit Diagnosis: Muscle weakness (generalized)  Stiffness of right shoulder, not elsewhere classified  Acute pain of left shoulder  Acute pain of right shoulder     Problem List Patient Active Problem List   Diagnosis Date Noted   Adhesive capsulitis of right shoulder    Complete tear of right rotator cuff    Degenerative superior labral anterior-to-posterior (SLAP) tear of right shoulder    Healthcare maintenance 07/31/2021   Congenital pes cavus 02/25/2021   History of uterine fibroid 01/05/2021   Herpes 07/12/2020   GERD (gastroesophageal reflux disease) 05/23/2020   Vaginal atrophy 05/23/2020   Cervical spine arthritis 05/23/2020   Osteopenia 05/23/2020   Insomnia 05/20/2020   Postmenopausal hormone therapy 05/20/2020    Damar Petit, PT, DPT 12/18/2021, 10:13 AM  [place] [address] [place], [place], [address] Phone: [phone]   Fax:  [phone]  Name: [name] MRN: [id number] Date of Birth: Aug 03, 1961

## 2021-12-19 ENCOUNTER — Ambulatory Visit: Payer: 59

## 2021-12-19 ENCOUNTER — Other Ambulatory Visit: Payer: Self-pay | Admitting: Surgical

## 2021-12-19 ENCOUNTER — Other Ambulatory Visit: Payer: Self-pay

## 2021-12-19 DIAGNOSIS — M25511 Pain in right shoulder: Secondary | ICD-10-CM | POA: Diagnosis not present

## 2021-12-19 DIAGNOSIS — M25611 Stiffness of right shoulder, not elsewhere classified: Secondary | ICD-10-CM | POA: Diagnosis not present

## 2021-12-19 DIAGNOSIS — M6281 Muscle weakness (generalized): Secondary | ICD-10-CM

## 2021-12-19 DIAGNOSIS — M25512 Pain in left shoulder: Secondary | ICD-10-CM | POA: Diagnosis not present

## 2021-12-19 DIAGNOSIS — S46011D Strain of muscle(s) and tendon(s) of the rotator cuff of right shoulder, subsequent encounter: Secondary | ICD-10-CM | POA: Diagnosis not present

## 2021-12-19 MED ORDER — ESTRADIOL 10 MCG VA TABS
ORAL_TABLET | VAGINAL | 0 refills | Status: AC
  Filled 2021-12-19: qty 16, 63d supply, fill #0
  Filled 2021-12-19: qty 18, 63d supply, fill #0

## 2021-12-19 MED ORDER — CELECOXIB 100 MG PO CAPS
100.0000 mg | ORAL_CAPSULE | Freq: Two times a day (BID) | ORAL | 0 refills | Status: DC
  Filled 2021-12-19: qty 60, 30d supply, fill #0

## 2021-12-19 NOTE — Therapy (Signed)
[place] MAIN [facility] SERVICES [address] [place], [place], [address] Phone: [phone]   Fax:  [phone]  Physical Therapy Treatment  Patient Details  Name: [name] MRN: [id number] Date of Birth: 1961/01/28 No data recorded  Encounter Date: 12/19/2021   PT End of Session - 12/19/21 1647     Visit Number 23    Number of Visits 44    Date for PT Re-Evaluation 02/05/22    Authorization Type last recert/progress note 97/35    Progress Note Due on Visit 20    PT Start Time 1603    PT Stop Time 1643    PT Time Calculation (min) 40 min    Activity Tolerance Patient tolerated treatment well    Behavior During Therapy [facility] for tasks assessed/performed             Past Medical History:  Diagnosis Date   Complication of anesthesia    PONV (postoperative nausea and vomiting)     Past Surgical History:  Procedure Laterality Date   AUGMENTATION MAMMAPLASTY Bilateral 2010   BICEPT TENODESIS Right 09/24/2021   Procedure: BICEPS TENODESIS;  Surgeon: [name], MD;  Location: [place];  Service: Orthopedics;  Laterality: Right;   CERVICAL SPINE SURGERY  2003   C5-C7- double dissectomy   PAROTID GLAND TUMOR EXCISION     SHOULDER ARTHROSCOPY Right 09/24/2021   Procedure: right shoulder arthroscopy, debridement,open rotator cuff tear repair;  Surgeon: [name], MD;  Location: [place];  Service: Orthopedics;  Laterality: Right;   WISDOM TOOTH EXTRACTION Bilateral     There were no vitals filed for this visit.   Subjective Assessment - 12/19/21 1602     Subjective Pt reports no pain currently in RUE. She does report some soreness, however. Pt still reports difficulty lying flat due to RUE discomfort. Pt thinks she may have overdone it with activity today. She reports she has been using her RUE a lot to prepare for a dinner party.    Pertinent History Pt is a 60 y/o female s/p right shoulder arthroscopy with superior  labral debridement, biceps tendon release, subacromial decompression, mini open rotator cuff tear repair and biceps tenodesis - surgery preformed on 09/24/21. She had a follow-up appt with Dr. [name] on 10/26 - sling was removed this date. MD encourages wearing sling at night, otherwise encourages leaving sling off during the day. Shoulder injury occured in March 2022 - she was using a scooter due to an ankle injury. She fell off of the scooter and onto her right shoulder. 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 Of note, pt also reports C5-C7 anterior fusion in 2003.    Limitations Writing;House hold activities;Lifting;Other (comment);Walking;Standing    How long can you sit comfortably? WFL    How long can you stand comfortably? WFL w/ RUE supported    How long can you walk comfortably? 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 Yes    Pain Location Shoulder    Pain Orientation Right    Pain Onset More than a month ago             INTERVENTIONS  Manual: Pt in supine-reclined position on mat table with heat applied to R shoulder and towel placed under RUE for added  support: PT performed PROM to RUE all planes for multiple reps: flexion, abduction, IR, ER; increased focus on ER and abduction, held at pain-free end range for multiple bouts of 20-30 sec.   TherEx: Reclined-supine: PVC pipe shoulder flexion B 2x12; pt reports some fatigue. Pt reports some popping sensation with eccentric with LUE  PVC ER RUE 2x20 RUE scapular protraction/punches 2# for 2x10; pt rates as easy-medium.  Seated: RUE elbow curls 2# 2x10 reps with cues to slow down UE movement for better motor control and strengthening  Standing: R shoulder isometrics with towel roll into wall: flexion, extension, IR/ER, abduction - 10x for each held for 3 sec hold per  rep. Pt rates easy-medium  Scapular AROM/endurance: 10-15 reps of each of the following, performed B, elastogel on R shoulder for interventions Retraction Protraction Elevation Depression Scapular circles CW Scapular circles CC Pt provided with mirror for additional cue to promote motor control.        Pt educated throughout session about proper posture and technique with exercises. Improved exercise technique, movement at target joints, use of target muscles after min to mod verbal, visual, tactile cues.  PT issues pt blue theraputty for advancing grip strengthening of RUE at home.  Pt responded well to both heat and elastogel with no adverse reaction reported or observed.    PT Short Term Goals - 12/03/21 0900       PT SHORT TERM GOAL #1   Title Pt will be independent with HEP in order to improve strength and decrease pain in order to improve pain-free function at home and work.    Baseline 11/18: pt reports compliance with HEP, HEP to be further advanced as per protocol, 12/13: still doing regularly, no issues    Time 2    Period Weeks    Status On-going    Target Date 11/27/21               PT Long Term Goals - 12/11/21 1224       PT LONG TERM GOAL #1   Title Patient will improve right shoulder PROM to > 140 degrees of flexion, scaption, and abduction for improved ability to perform overhead activities.    Baseline 10/18/21: flexion 41*, abd 60*; 11/18: flexoin 96 deg, abduction 81 deg, scaption 104 deg, ER 14 deg (following interventions), 12/13: see flowsheet, 12/21: see flowsheet    Time 8    Period Weeks    Status Partially Met    Target Date 02/05/22      PT LONG TERM GOAL #2   Title Patient will improve right shoulder external rotation in order to perform hygiene/ADL's such as hair care pain-free.    Baseline 10/18/21: ER 0*; 11/18: ER 14 deg, pain limited, 12/13: see flowsheet, 12/21: see flowsheet    Time 8    Period Weeks    Status Partially Met     Target Date 02/05/22      PT LONG TERM GOAL #3   Title Pt will decrease quick DASH score by at least 8% in order to demonstrate clinically significant reduction in disability.    Baseline 10/18/21: 80% 11/18: 70.45%, 12/21: deferred    Time 8    Period Weeks    Status Achieved    Target Date 02/05/22      PT LONG TERM GOAL #4   Title Pt will decrease worst pain as reported on NPRS by at least 3 points in order to demonstrate clinically significant reduction in pain  Baseline 10/18/21: 10/10 pain; 11/18: 6-7/10,    Time 8    Period Weeks    Status Achieved    Target Date 02/05/22      PT LONG TERM GOAL #5   Title Patient will increase FOTO score to equal to or greater than 55 to demonstrate statistically significant improvement in mobility and quality of life.    Baseline 10/18/21: 20%; 11/18: 32%, 12/13: deferred, 12/21: 55%    Time 8    Period Weeks    Status Achieved    Target Date 02/05/22      PT LONG TERM GOAL #6   Title Pt will decrease quick DASH score by at least 8% in order to demonstrate clinically significant reduction in disability.    Baseline 11/18: 70%, 12/13: 54.5%    Time 8    Period Weeks    Status Achieved    Target Date 02/05/22      PT LONG TERM GOAL #7   Title Patient will decrease pain VAS to <3/10 for L shoulder to improve quality of life and allow patient to sleep through the night.    Baseline 12/15: 6/10,    Time 8    Period Weeks    Status New    Target Date 02/05/22                   Plan - 12/19/21 1647     Clinical Impression Statement Pt shows progress with ability to tolerate multiple RUE strengthening interventions without excessive levels of fatigue and without a pain increase. Pt also demonstrates progress with reports of LUE symptoms as well. Pt still most challenged with R shoulder abduction and ER. The pt will continue to benefit from further skilled PT to improve UE strength, ROM, and pain in order to return to PLOF.     Personal Factors and Comorbidities Age;Time since onset of injury/illness/exacerbation    Examination-Activity Limitations Bathing;Reach Overhead;Dressing;Hygiene/Grooming;Toileting;Lift;Sleep;Carry    Examination-Participation Restrictions Driving;Cleaning;Meal Prep;Community Activity;Laundry;Occupation;Shop    Stability/Clinical Decision Making Stable/Uncomplicated    Rehab Potential Good    PT Frequency 3x / week    PT Duration 8 weeks    PT Treatment/Interventions ADLs/Self Care Home Management;Aquatic Therapy;Biofeedback;Electrical Stimulation;Cryotherapy;Iontophoresis 40m/ml Dexamethasone;Moist Heat;Traction;Ultrasound;DME Instruction;Functional mobility training;Therapeutic activities;Therapeutic exercise;Neuromuscular re-education;Patient/family education;Manual techniques;Scar mobilization;Passive range of motion;Dry needling;Other (comment);Joint Manipulations    PT Next Visit Plan progress strengthening of R, pain reduction of L, continue POC as previously indicated    PT Home Exercise Plan RUE AAROM - flexion, abduction, ER (using PVC) - no printout; no updates; 11/18 to be performed LUE only Access Code: [email or web address]; 11/22: green theraputty for R grip strength x2 min; provided pt with blue theraputty for grip strengthening    Consulted and Agree with Plan of Care Patient             Patient will benefit from skilled therapeutic intervention in order to improve the following deficits and impairments:  Decreased activity tolerance, Decreased range of motion, Hypomobility, Impaired perceived functional ability, Impaired UE functional use, Improper body mechanics, Pain, Decreased mobility  Visit Diagnosis: Stiffness of right shoulder, not elsewhere classified  Muscle weakness (generalized)     Problem List Patient Active Problem List   Diagnosis Date Noted   Adhesive capsulitis of right shoulder    Complete tear of right rotator cuff    Degenerative superior labral  anterior-to-posterior (SLAP) tear of right shoulder    Healthcare maintenance 07/31/2021   Congenital pes cavus 02/25/2021   History  of uterine fibroid 01/05/2021   Herpes 07/12/2020   GERD (gastroesophageal reflux disease) 05/23/2020   Vaginal atrophy 05/23/2020   Cervical spine arthritis 05/23/2020   Osteopenia 05/23/2020   Insomnia 05/20/2020   Postmenopausal hormone therapy 05/20/2020    Zollie Pee, PT 12/19/2021, 4:59 PM  [place] MAIN [facility] SERVICES [address] [place], [place], [address] Phone: [phone]   Fax:  [phone]  Name: [name] MRN: [id number] Date of Birth: 07/03/1961

## 2021-12-20 ENCOUNTER — Other Ambulatory Visit: Payer: Self-pay

## 2021-12-24 ENCOUNTER — Ambulatory Visit: Payer: 59 | Attending: Orthopedic Surgery | Admitting: Physical Therapy

## 2021-12-24 ENCOUNTER — Other Ambulatory Visit: Payer: Self-pay

## 2021-12-24 DIAGNOSIS — M25511 Pain in right shoulder: Secondary | ICD-10-CM | POA: Insufficient documentation

## 2021-12-24 DIAGNOSIS — M25512 Pain in left shoulder: Secondary | ICD-10-CM | POA: Insufficient documentation

## 2021-12-24 DIAGNOSIS — S46011D Strain of muscle(s) and tendon(s) of the rotator cuff of right shoulder, subsequent encounter: Secondary | ICD-10-CM | POA: Diagnosis not present

## 2021-12-24 DIAGNOSIS — M25611 Stiffness of right shoulder, not elsewhere classified: Secondary | ICD-10-CM | POA: Diagnosis not present

## 2021-12-24 NOTE — Therapy (Signed)
[place] MAIN [facility] SERVICES [address] [place], [place], [address] Phone: [phone]   Fax:  [phone]  Physical Therapy Treatment  Patient Details  Name: [name] MRN: [id number] Date of Birth: 1961/02/26 No data recorded  Encounter Date: 12/24/2021   PT End of Session - 12/24/21 1204     Visit Number 24    Number of Visits 44    Date for PT Re-Evaluation 02/05/22    Authorization Type last recert/progress note 06/77    Progress Note Due on Visit 20    PT Start Time 1150    PT Stop Time 1229    PT Time Calculation (min) 39 min    Equipment Utilized During Treatment Gait belt    Activity Tolerance Patient tolerated treatment well    Behavior During Therapy WFL for tasks assessed/performed             Past Medical History:  Diagnosis Date   Complication of anesthesia    PONV (postoperative nausea and vomiting)     Past Surgical History:  Procedure Laterality Date   AUGMENTATION MAMMAPLASTY Bilateral 2010   BICEPT TENODESIS Right 09/24/2021   Procedure: BICEPS TENODESIS;  Surgeon: [name], MD;  Location: [place];  Service: Orthopedics;  Laterality: Right;   CERVICAL SPINE SURGERY  2003   C5-C7- double dissectomy   PAROTID GLAND TUMOR EXCISION     SHOULDER ARTHROSCOPY Right 09/24/2021   Procedure: right shoulder arthroscopy, debridement,open rotator cuff tear repair;  Surgeon: [name], MD;  Location: [place];  Service: Orthopedics;  Laterality: Right;   WISDOM TOOTH EXTRACTION Bilateral     There were no vitals filed for this visit.   Subjective Assessment - 12/24/21 1153     Subjective Pt reports no pain currently in RUE. Pt reports R UE has been improving and her numbness and tingling has been less frequent.    Pertinent History Pt is a 61 y/o female s/p right shoulder arthroscopy with superior labral debridement, biceps tendon release, subacromial decompression, mini open rotator cuff tear  repair and biceps tenodesis - surgery preformed on 09/24/21. She had a follow-up appt with Dr. [name] on 10/26 - sling was removed this date. MD encourages wearing sling at night, otherwise encourages leaving sling off during the day. Shoulder injury occured in March 2022 - she was using a scooter due to an ankle injury. She fell off of the scooter and onto her right shoulder. 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 Of note, pt also reports C5-C7 anterior fusion in 2003.    Limitations Writing;House hold activities;Lifting;Other (comment);Walking;Standing    How long can you sit comfortably? WFL    How long can you stand comfortably? WFL w/ RUE supported    How long can you walk comfortably? 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   TherEx: Reclined-supine:  PVC pipe shoulder flexion B 2x12x5sec hold; pt reports some fatigue. Pt reports some popping sensation with eccentric with LUE  PVC ER RUE 2x10 x 5 sec holds RUE scapular protraction/punches 2# for 2x10; pt rates as easy-medium.    Standing: R shoulder rows, extensions, IR and ER with YTB -no pain noted, pt provided with YTB and RTB and was instructed to progress resistance with rows if they felt easy and she was not extremely sore folloiwng activities from today      Pt educated throughout session about proper posture and technique with exercises. Improved exercise technique, movement at target joints, use of target muscles  after min to mod verbal, visual, tactile cues.                           PT Education - 12/24/21 1432     Education Details exercise technique    Person(s) Educated Patient              PT Short Term Goals - 12/03/21 0900       PT SHORT TERM GOAL #1   Title Pt will be independent with HEP in order to improve strength and decrease pain in order to improve pain-free function at home and work.    Baseline 11/18: pt reports compliance with HEP, HEP to be further advanced as per protocol, 12/13: still doing regularly, no issues    Time 2    Period Weeks    Status On-going    Target Date 11/27/21               PT Long Term Goals - 12/11/21 1224       PT LONG TERM GOAL #1   Title Patient will improve right shoulder PROM to > 140 degrees of flexion, scaption, and abduction for improved ability to perform overhead activities.    Baseline 10/18/21: flexion 41*, abd 60*; 11/18: flexoin 96 deg, abduction 81 deg, scaption 104 deg, ER 14 deg (following interventions), 12/13: see flowsheet, 12/21: see flowsheet    Time 8    Period Weeks    Status Partially Met    Target Date 02/05/22      PT LONG TERM GOAL #2   Title Patient will improve right shoulder external rotation in order to perform hygiene/ADL's such as hair care pain-free.    Baseline 10/18/21: ER 0*; 11/18: ER 14 deg, pain limited, 12/13: see flowsheet, 12/21: see flowsheet    Time 8    Period Weeks    Status Partially Met    Target Date 02/05/22      PT LONG TERM GOAL #3   Title Pt will decrease quick DASH score by at least 8% in order to demonstrate clinically significant reduction in disability.    Baseline 10/18/21: 80% 11/18: 70.45%, 12/21: deferred    Time 8    Period Weeks    Status Achieved    Target Date 02/05/22      PT LONG TERM GOAL #4   Title Pt will decrease worst pain as reported on NPRS by at least 3 points in order to demonstrate clinically significant reduction in pain     Baseline 10/18/21: 10/10 pain; 11/18: 6-7/10,    Time 8    Period Weeks    Status Achieved    Target Date 02/05/22      PT LONG TERM GOAL #5   Title Patient will increase FOTO score to equal to or greater  than 55 to demonstrate statistically significant improvement in mobility and quality of life.    Baseline 10/18/21: 20%; 11/18: 32%, 12/13: deferred, 12/21: 55%    Time 8    Period Weeks    Status Achieved    Target Date 02/05/22      PT LONG TERM GOAL #6   Title Pt will decrease quick DASH score by at least 8% in order to demonstrate clinically significant reduction in disability.    Baseline 11/18: 70%, 12/13: 54.5%    Time 8    Period Weeks    Status Achieved    Target Date 02/05/22      PT LONG TERM GOAL #7   Title Patient will decrease pain VAS to <3/10 for L shoulder to improve quality of life and allow patient to sleep through the night.    Baseline 12/15: 6/10,    Time 8    Period Weeks    Status New    Target Date 02/05/22                   Plan - 12/24/21 1205     Clinical Impression Statement Pt continuing to improve with UE ROM and strengthening exercises. Was able to progress with some resisted exercises today as listed in note. Pt tolerated well without any c/o pain. Pt continues to progress with ROM and strengthening activities and will continue to progress with these and more functional shoulder strengtheing activities in future sessions.    Personal Factors and Comorbidities Age;Time since onset of injury/illness/exacerbation    Examination-Activity Limitations Bathing;Reach Overhead;Dressing;Hygiene/Grooming;Toileting;Lift;Sleep;Carry    Examination-Participation Restrictions Driving;Cleaning;Meal Prep;Community Activity;Laundry;Occupation;Shop    Stability/Clinical Decision Making Stable/Uncomplicated    Rehab Potential Good    PT Frequency 3x / week    PT Duration 8 weeks    PT Treatment/Interventions ADLs/Self Care Home Management;Aquatic  Therapy;Biofeedback;Electrical Stimulation;Cryotherapy;Iontophoresis 4mg /ml Dexamethasone;Moist Heat;Traction;Ultrasound;DME Instruction;Functional mobility training;Therapeutic activities;Therapeutic exercise;Neuromuscular re-education;Patient/family education;Manual techniques;Scar mobilization;Passive range of motion;Dry needling;Other (comment);Joint Manipulations    PT Next Visit Plan progress strengthening of R, pain reduction of L, continue POC as previously indicated    PT Home Exercise Plan RUE AAROM - flexion, abduction, ER (using PVC) - no printout; no updates; 11/18 to be performed LUE only Access Code: [email or web address]; 11/22: green theraputty for R grip strength x2 min; provided pt with blue theraputty for grip strengthening    Consulted and Agree with Plan of Care Patient             Patient will benefit from skilled therapeutic intervention in order to improve the following deficits and impairments:  Decreased activity tolerance, Decreased range of motion, Hypomobility, Impaired perceived functional ability, Impaired UE functional use, Improper body mechanics, Pain, Decreased mobility  Visit Diagnosis: Stiffness of right shoulder, not elsewhere classified  Acute pain of left shoulder  Acute pain of right shoulder  Traumatic complete tear of right rotator cuff, subsequent encounter     Problem List Patient Active Problem List   Diagnosis Date Noted   Adhesive capsulitis of right shoulder    Complete tear of right rotator cuff    Degenerative superior labral anterior-to-posterior (SLAP) tear of right shoulder    Healthcare maintenance 07/31/2021   Congenital pes cavus 02/25/2021   History of uterine fibroid 01/05/2021   Herpes 07/12/2020   GERD (gastroesophageal reflux disease) 05/23/2020   Vaginal atrophy 05/23/2020   Cervical spine arthritis 05/23/2020   Osteopenia 05/23/2020   Insomnia 05/20/2020   Postmenopausal hormone therapy 05/20/2020  Particia Lather,  PT 12/24/2021, 2:39 PM  [place] MAIN [facility] SERVICES [address] [place], [place], [address] Phone: [phone]   Fax:  [phone]  Name: [name] MRN: [id number] Date of Birth: [birth year]

## 2021-12-26 ENCOUNTER — Ambulatory Visit: Payer: 59 | Admitting: Physical Therapy

## 2021-12-26 ENCOUNTER — Other Ambulatory Visit: Payer: Self-pay

## 2021-12-26 ENCOUNTER — Encounter: Payer: Self-pay | Admitting: Physical Therapy

## 2021-12-26 ENCOUNTER — Other Ambulatory Visit: Payer: Self-pay | Admitting: Surgical

## 2021-12-26 DIAGNOSIS — M25611 Stiffness of right shoulder, not elsewhere classified: Secondary | ICD-10-CM

## 2021-12-26 DIAGNOSIS — S46011D Strain of muscle(s) and tendon(s) of the rotator cuff of right shoulder, subsequent encounter: Secondary | ICD-10-CM

## 2021-12-26 DIAGNOSIS — M25512 Pain in left shoulder: Secondary | ICD-10-CM

## 2021-12-26 DIAGNOSIS — M25511 Pain in right shoulder: Secondary | ICD-10-CM | POA: Diagnosis not present

## 2021-12-26 MED ORDER — METHOCARBAMOL 500 MG PO TABS
500.0000 mg | ORAL_TABLET | Freq: Three times a day (TID) | ORAL | 1 refills | Status: DC | PRN
  Filled 2021-12-26: qty 30, 10d supply, fill #0
  Filled 2022-01-14: qty 30, 10d supply, fill #1

## 2021-12-26 NOTE — Telephone Encounter (Signed)
Sent in refill

## 2021-12-26 NOTE — Therapy (Signed)
[place] MAIN [facility] SERVICES [address] [place], [place], [address] Phone: [phone]   Fax:  [phone]  Physical Therapy Treatment  Patient Details  Name: [name] MRN: [id number] Date of Birth: 11/24/1961 No data recorded  Encounter Date: 12/26/2021   PT End of Session - 12/26/21 1209     Visit Number 25    Number of Visits 44    Date for PT Re-Evaluation 02/05/22    Authorization Type last recert/progress note 09/73    Progress Note Due on Visit 30    PT Start Time 1148    PT Stop Time 1231    PT Time Calculation (min) 43 min    Equipment Utilized During Treatment Gait belt    Activity Tolerance Patient tolerated treatment well    Behavior During Therapy WFL for tasks assessed/performed             Past Medical History:  Diagnosis Date   Complication of anesthesia    PONV (postoperative nausea and vomiting)     Past Surgical History:  Procedure Laterality Date   AUGMENTATION MAMMAPLASTY Bilateral 2010   BICEPT TENODESIS Right 09/24/2021   Procedure: BICEPS TENODESIS;  Surgeon: [name], MD;  Location: [place];  Service: Orthopedics;  Laterality: Right;   CERVICAL SPINE SURGERY  2003   C5-C7- double dissectomy   PAROTID GLAND TUMOR EXCISION     SHOULDER ARTHROSCOPY Right 09/24/2021   Procedure: right shoulder arthroscopy, debridement,open rotator cuff tear repair;  Surgeon: [name], MD;  Location: [place];  Service: Orthopedics;  Laterality: Right;   WISDOM TOOTH EXTRACTION Bilateral     There were no vitals filed for this visit.   Subjective Assessment - 12/26/21 1150     Subjective Pt rpeort ssome soreness when trying to attempt HEP but was able to complete HEP. Pt is still attempting to sleep flat and laying on her sides, still having difficulty with getting comfortable for sleeping.    Pertinent History Pt is a 61 y/o female s/p right shoulder arthroscopy with superior labral debridement,  biceps tendon release, subacromial decompression, mini open rotator cuff tear repair and biceps tenodesis - surgery preformed on 09/24/21. She had a follow-up appt with Dr. [name] on 10/26 - sling was removed this date. MD encourages wearing sling at night, otherwise encourages leaving sling off during the day. Shoulder injury occured in March 2022 - she was using a scooter due to an ankle injury. She fell off of the scooter and onto her right shoulder. 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 Of note, pt also reports C5-C7 anterior fusion in 2003.    Limitations Writing;House hold activities;Lifting;Other (comment);Walking;Standing    How long can you sit comfortably? WFL    How long can you stand comfortably? WFL w/ RUE supported    How long can you walk comfortably? 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 Yes    Pain Score 2     Pain Location Shoulder    Pain Orientation Right    Pain Descriptors / Indicators Sore    Pain Type Chronic pain    Pain Onset More than a month ago              Manual: Pt in supine-reclined position  on mat table wand towel placed under RUE for added support: STM and min AC joint mobs (grade 1/2) to righ tAC joint. Resulted in increase in ER rom (40 degrees following stretching)  PT performed PROM to RUE all planes for multiple reps: flexion, abduction, , ER; increased focus on ER and abduction, held at pain-free end range for multiple bouts of 20-30 sec.Significantly improved ER ROM measured at 40 degrees this session.    TherEx: Reclined-supine: PVC pipe shoulder flexion B 2x10x5sec hold; pt reports some fatigue. Pt reports some popping sensation with eccentric with LUE  PVC ER RUE 2x10 x 5 sec holds RUE scapular protraction/punches 2# with min shoulder flexion and extension while maintaining  protraction for 2x10; pt rates as easy-medium. -lowered head of table down due to discomfort in shoulder with this exercises, this improved discomfort felt.   SL shoulder external rotation 1 x 10 no weight  2 x 10 with 1# -towel roll under arm  -muscles got tired but no pain reported   Standing: R shoulder extensions with YTB -no pain noted, fatigue noted at end of reps   Ice x 4 min at end of session- no Charge   Pt educated throughout session about proper posture and technique with exercises. Improved exercise technique, movement at target joints, use of target muscles after min to mod verbal, visual, tactile cues.                           PT Education - 12/26/21 1612     Education Details Exercise technique    Person(s) Educated Patient    Methods Explanation;Demonstration;Tactile cues;Verbal cues    Comprehension Verbalized understanding;Returned demonstration               PT Short Term Goals - 12/03/21 0900       PT SHORT TERM GOAL #1   Title Pt will be independent with HEP in order to improve strength and decrease pain in order to improve pain-free function at home and work.    Baseline 11/18: pt reports compliance with HEP, HEP to be further advanced as per protocol, 12/13: still doing regularly, no issues    Time 2    Period Weeks    Status On-going    Target Date 11/27/21               PT Long Term Goals - 12/11/21 1224       PT LONG TERM GOAL #1   Title Patient will improve right shoulder PROM to > 140 degrees of flexion, scaption, and abduction for improved ability to perform overhead activities.    Baseline 10/18/21: flexion 41*, abd 60*; 11/18: flexoin 96 deg, abduction 81 deg, scaption 104 deg, ER 14 deg (following interventions), 12/13: see flowsheet, 12/21: see flowsheet    Time 8    Period Weeks    Status Partially Met    Target Date 02/05/22      PT LONG TERM GOAL #2   Title Patient will improve right shoulder  external rotation in order to perform hygiene/ADL's such as hair care pain-free.    Baseline 10/18/21: ER 0*; 11/18: ER 14 deg, pain limited, 12/13: see flowsheet, 12/21: see flowsheet    Time 8    Period Weeks    Status Partially Met    Target Date 02/05/22      PT LONG TERM GOAL #3   Title Pt will decrease quick DASH score by at  least 8% in order to demonstrate clinically significant reduction in disability.    Baseline 10/18/21: 80% 11/18: 70.45%, 12/21: deferred    Time 8    Period Weeks    Status Achieved    Target Date 02/05/22      PT LONG TERM GOAL #4   Title Pt will decrease worst pain as reported on NPRS by at least 3 points in order to demonstrate clinically significant reduction in pain    Baseline 10/18/21: 10/10 pain; 11/18: 6-7/10,    Time 8    Period Weeks    Status Achieved    Target Date 02/05/22      PT LONG TERM GOAL #5   Title Patient will increase FOTO score to equal to or greater than 55 to demonstrate statistically significant improvement in mobility and quality of life.    Baseline 10/18/21: 20%; 11/18: 32%, 12/13: deferred, 12/21: 55%    Time 8    Period Weeks    Status Achieved    Target Date 02/05/22      PT LONG TERM GOAL #6   Title Pt will decrease quick DASH score by at least 8% in order to demonstrate clinically significant reduction in disability.    Baseline 11/18: 70%, 12/13: 54.5%    Time 8    Period Weeks    Status Achieved    Target Date 02/05/22      PT LONG TERM GOAL #7   Title Patient will decrease pain VAS to <3/10 for L shoulder to improve quality of life and allow patient to sleep through the night.    Baseline 12/15: 6/10,    Time 8    Period Weeks    Status New    Target Date 02/05/22                   Plan - 12/26/21 1210     Clinical Impression Statement Pt continued with R UE strength and ROM interventions. Pt was able to make siginficant progress with R shoulder external rotation rom following mobilization  and soft tissue mobilization of muscles surrounding AC joint. Pt continues to progress with R shoulder strengthening and rotator cuff strengthening. Pt will conitnue to benefit form skilled PT intervention in order to improve shoulder rom, strength and funciton.    Personal Factors and Comorbidities Age;Time since onset of injury/illness/exacerbation    Examination-Activity Limitations Bathing;Reach Overhead;Dressing;Hygiene/Grooming;Toileting;Lift;Sleep;Carry    Examination-Participation Restrictions Driving;Cleaning;Meal Prep;Community Activity;Laundry;Occupation;Shop    Stability/Clinical Decision Making Stable/Uncomplicated    Rehab Potential Good    PT Frequency 3x / week    PT Duration 8 weeks    PT Treatment/Interventions ADLs/Self Care Home Management;Aquatic Therapy;Biofeedback;Electrical Stimulation;Cryotherapy;Iontophoresis 4mg /ml Dexamethasone;Moist Heat;Traction;Ultrasound;DME Instruction;Functional mobility training;Therapeutic activities;Therapeutic exercise;Neuromuscular re-education;Patient/family education;Manual techniques;Scar mobilization;Passive range of motion;Dry needling;Other (comment);Joint Manipulations    PT Next Visit Plan progress strengthening of R, pain reduction of L, continue POC as previously indicated    PT Home Exercise Plan RUE AAROM - flexion, abduction, ER (using PVC) - no printout; no updates; 11/18 to be performed LUE only Access Code: [email or web address]; 11/22: green theraputty for R grip strength x2 min; provided pt with blue theraputty for grip strengthening    Consulted and Agree with Plan of Care Patient              Patient will benefit from skilled therapeutic intervention in order to improve the following deficits and impairments:  Decreased activity tolerance, Decreased range of motion, Hypomobility, Impaired perceived functional ability, Impaired UE functional  use, Improper body mechanics, Pain, Decreased mobility  Visit Diagnosis: Stiffness of right  shoulder, not elsewhere classified  Acute pain of left shoulder  Acute pain of right shoulder  Traumatic complete tear of right rotator cuff, subsequent encounter     Problem List Patient Active Problem List   Diagnosis Date Noted   Adhesive capsulitis of right shoulder    Complete tear of right rotator cuff    Degenerative superior labral anterior-to-posterior (SLAP) tear of right shoulder    Healthcare maintenance 07/31/2021   Congenital pes cavus 02/25/2021   History of uterine fibroid 01/05/2021   Herpes 07/12/2020   GERD (gastroesophageal reflux disease) 05/23/2020   Vaginal atrophy 05/23/2020   Cervical spine arthritis 05/23/2020   Osteopenia 05/23/2020   Insomnia 05/20/2020   Postmenopausal hormone therapy 05/20/2020    Particia Lather, PT 12/26/2021, 4:18 PM  [place] MAIN [facility] SERVICES [address] [place], [place], [address] Phone: [phone]   Fax:  [phone]  Name: [name] MRN: [id number] Date of Birth: July 13, 1961

## 2021-12-27 ENCOUNTER — Other Ambulatory Visit: Payer: Self-pay | Admitting: Surgical

## 2021-12-27 ENCOUNTER — Other Ambulatory Visit: Payer: Self-pay

## 2021-12-27 MED FILL — Tramadol HCl Tab 50 MG: ORAL | 30 days supply | Qty: 30 | Fill #0 | Status: CN

## 2021-12-30 ENCOUNTER — Other Ambulatory Visit: Payer: Self-pay

## 2021-12-30 ENCOUNTER — Ambulatory Visit: Payer: 59 | Admitting: Physical Therapy

## 2021-12-30 DIAGNOSIS — M25611 Stiffness of right shoulder, not elsewhere classified: Secondary | ICD-10-CM | POA: Diagnosis not present

## 2021-12-30 DIAGNOSIS — S46011D Strain of muscle(s) and tendon(s) of the rotator cuff of right shoulder, subsequent encounter: Secondary | ICD-10-CM | POA: Diagnosis not present

## 2021-12-30 DIAGNOSIS — M25512 Pain in left shoulder: Secondary | ICD-10-CM | POA: Diagnosis not present

## 2021-12-30 DIAGNOSIS — M25511 Pain in right shoulder: Secondary | ICD-10-CM | POA: Diagnosis not present

## 2021-12-30 NOTE — Therapy (Signed)
[place] MAIN [facility] SERVICES [address] [place], [place], [address] Phone: [phone]   Fax:  [phone]  Physical Therapy Treatment  Patient Details  Name: [name] MRN: [id number] Date of Birth: 01/03/1961 No data recorded  Encounter Date: 12/30/2021   PT End of Session - 12/30/21 1042     Visit Number 26    Number of Visits 44    Date for PT Re-Evaluation 02/05/22    Authorization Type last recert/progress note 29/93    Progress Note Due on Visit 30    PT Start Time 1006    PT Stop Time 1049    PT Time Calculation (min) 43 min    Equipment Utilized During Treatment Gait belt    Activity Tolerance Patient tolerated treatment well    Behavior During Therapy WFL for tasks assessed/performed             Past Medical History:  Diagnosis Date   Complication of anesthesia    PONV (postoperative nausea and vomiting)     Past Surgical History:  Procedure Laterality Date   AUGMENTATION MAMMAPLASTY Bilateral 2010   BICEPT TENODESIS Right 09/24/2021   Procedure: BICEPS TENODESIS;  Surgeon: [name], MD;  Location: [facility] [place];  Service: Orthopedics;  Laterality: Right;   CERVICAL SPINE SURGERY  2003   C5-C7- double dissectomy   PAROTID GLAND TUMOR EXCISION     SHOULDER ARTHROSCOPY Right 09/24/2021   Procedure: right shoulder arthroscopy, debridement,open rotator cuff tear repair;  Surgeon: [name], MD;  Location: [facility];  Service: Orthopedics;  Laterality: Right;   WISDOM TOOTH EXTRACTION Bilateral     There were no vitals filed for this visit.   Subjective Assessment - 12/30/21 1008     Subjective Pt reports no significant changes since last session. Able to perform her HEP once over the weekend. Had some soreness so took a day off over the wekeend.    Pertinent History Pt is a 61 y/o female s/p right shoulder arthroscopy with superior labral debridement, biceps tendon release, subacromial decompression,  mini open rotator cuff tear repair and biceps tenodesis - surgery preformed on 09/24/21. She had a follow-up appt with Dr. [name] on 10/26 - sling was removed this date. MD encourages wearing sling at night, otherwise encourages leaving sling off during the day. Shoulder injury occured in March 2022 - she was using a scooter due to an ankle injury. She fell off of the scooter and onto her right shoulder. 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 Of note, pt also reports C5-C7 anterior fusion in 2003.    Limitations Writing;House hold activities;Lifting;Other (comment);Walking;Standing    How long can you sit comfortably? WFL    How long can you stand comfortably? WFL w/ RUE supported    How long can you walk comfortably? 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 Resulted in increase in ER rom (40 degrees following stretching)  PT performed PROM to RUE all planes for multiple reps: flexion, abduction, ,  ER; increased focus on ER , held at pain-free end range for multiple bouts of 20-30 sec. -Continued improvement noted in ER this session following focus on [facility] joint mobility with STM   TherEx: Reclined-supine: PVC pipe shoulder flexion B 2x10x5sec hold; pt reports some fatigue. Pt reports some popping sensation with eccentric with LUE  PVC ER RUE 2x10 x 5 sec holds RUE scapular protraction/punches 2# with min ROM shoulder flexion and extension while maintaining protraction for 2x10; . -lowered head of table down due to discomfort in shoulder with this exercises, this improved discomfort felt.    2 x 10 with 1# -towel roll under arm  -muscles got  tired but no pain reported  -tingling noted in fingers that went away upon cessation   Standing: -R shoulder extensions with YTB 2 x 10 -no pain noted, fatigue noted at end of reps  Standing and IR with YTB 2 x 10  Ice x 4 min at end of session- no Charge   Pt educated throughout session about proper posture and technique with exercises. Improved exercise technique, movement at target joints, use of target muscles after min to mod verbal, visual, tactile cues.                            PT Education - 12/30/21 1042     Education Details Exercise form and technique    Person(s) Educated Patient    Methods Explanation;Demonstration;Tactile cues;Verbal cues    Comprehension Verbalized understanding;Returned demonstration              PT Short Term Goals - 12/03/21 0900       PT SHORT TERM GOAL #1   Title Pt will be independent with HEP in order to improve strength and decrease pain in order to improve pain-free function at home and work.    Baseline 11/18: pt reports compliance with HEP, HEP to be further advanced as per protocol, 12/13: still doing regularly, no issues    Time 2    Period Weeks    Status On-going    Target Date 11/27/21               PT Long Term Goals - 12/11/21 1224       PT LONG TERM GOAL #1   Title Patient will improve right shoulder PROM to > 140 degrees of flexion, scaption, and abduction for improved ability to perform overhead activities.    Baseline 10/18/21: flexion 41*, abd 60*; 11/18: flexoin 96 deg, abduction 81 deg, scaption 104 deg, ER 14 deg (following interventions), 12/13: see flowsheet, 12/21: see flowsheet    Time 8    Period Weeks    Status Partially Met    Target Date 02/05/22      PT LONG TERM GOAL #2   Title Patient will improve right shoulder external rotation in order to perform hygiene/ADL's such as hair care pain-free.    Baseline 10/18/21: ER 0*; 11/18: ER 14 deg, pain limited, 12/13: see  flowsheet, 12/21: see flowsheet    Time 8    Period Weeks    Status Partially Met    Target Date 02/05/22      PT LONG TERM GOAL #3   Title Pt will decrease quick DASH score by at least 8% in order to demonstrate clinically significant reduction in disability.    Baseline 10/18/21: 80% 11/18: 70.45%, 12/21: deferred    Time 8    Period Weeks  Status Achieved    Target Date 02/05/22      PT LONG TERM GOAL #4   Title Pt will decrease worst pain as reported on NPRS by at least 3 points in order to demonstrate clinically significant reduction in pain    Baseline 10/18/21: 10/10 pain; 11/18: 6-7/10,    Time 8    Period Weeks    Status Achieved    Target Date 02/05/22      PT LONG TERM GOAL #5   Title Patient will increase FOTO score to equal to or greater than 55 to demonstrate statistically significant improvement in mobility and quality of life.    Baseline 10/18/21: 20%; 11/18: 32%, 12/13: deferred, 12/21: 55%    Time 8    Period Weeks    Status Achieved    Target Date 02/05/22      PT LONG TERM GOAL #6   Title Pt will decrease quick DASH score by at least 8% in order to demonstrate clinically significant reduction in disability.    Baseline 11/18: 70%, 12/13: 54.5%    Time 8    Period Weeks    Status Achieved    Target Date 02/05/22      PT LONG TERM GOAL #7   Title Patient will decrease pain VAS to <3/10 for L shoulder to improve quality of life and allow patient to sleep through the night.    Baseline 12/15: 6/10,    Time 8    Period Weeks    Status New    Target Date 02/05/22                   Plan - 12/30/21 1045     Clinical Impression Statement Pt continued with R UE strength and ROM interventions. Pt was able to maintain siginficant progress with R shoulder external rotation rom following mobilization and soft tissue mobilization of muscles surrounding AC joint. Pt continues to progress with R shoulder strengthening and rotator cuff strengthening. Pt  will conitnue to benefit form skilled PT intervention in order to improve shoulder rom, strength and funciton.    Personal Factors and Comorbidities Age;Time since onset of injury/illness/exacerbation    Examination-Activity Limitations Bathing;Reach Overhead;Dressing;Hygiene/Grooming;Toileting;Lift;Sleep;Carry    Examination-Participation Restrictions Driving;Cleaning;Meal Prep;Community Activity;Laundry;Occupation;Shop    Stability/Clinical Decision Making Stable/Uncomplicated    Rehab Potential Good    PT Frequency 3x / week    PT Duration 8 weeks    PT Treatment/Interventions ADLs/Self Care Home Management;Aquatic Therapy;Biofeedback;Electrical Stimulation;Cryotherapy;Iontophoresis 9m/ml Dexamethasone;Moist Heat;Traction;Ultrasound;DME Instruction;Functional mobility training;Therapeutic activities;Therapeutic exercise;Neuromuscular re-education;Patient/family education;Manual techniques;Scar mobilization;Passive range of motion;Dry needling;Other (comment);Joint Manipulations    PT Next Visit Plan progress strengthening of R, pain reduction of L, continue POC as previously indicated    PT Home Exercise Plan RUE AAROM - flexion, abduction, ER (using PVC) - no printout; no updates; 11/18 to be performed LUE only Access Code: [email or web address]; 11/22: green theraputty for R grip strength x2 min; provided pt with blue theraputty for grip strengthening    Consulted and Agree with Plan of Care Patient             Patient will benefit from skilled therapeutic intervention in order to improve the following deficits and impairments:  Decreased activity tolerance, Decreased range of motion, Hypomobility, Impaired perceived functional ability, Impaired UE functional use, Improper body mechanics, Pain, Decreased mobility  Visit Diagnosis: Stiffness of right shoulder, not elsewhere classified  Acute pain of left shoulder  Acute pain of right shoulder     Problem List  Patient Active Problem List    Diagnosis Date Noted   Adhesive capsulitis of right shoulder    Complete tear of right rotator cuff    Degenerative superior labral anterior-to-posterior (SLAP) tear of right shoulder    Healthcare maintenance 07/31/2021   Congenital pes cavus 02/25/2021   History of uterine fibroid 01/05/2021   Herpes 07/12/2020   GERD (gastroesophageal reflux disease) 05/23/2020   Vaginal atrophy 05/23/2020   Cervical spine arthritis 05/23/2020   Osteopenia 05/23/2020   Insomnia 05/20/2020   Postmenopausal hormone therapy 05/20/2020    Particia Lather, PT 12/30/2021, 11:27 AM  [place] MAIN [facility] SERVICES [address] [place], [place], [address] Phone: [phone]   Fax:  [phone]  Name: Kasheena Sambrano MRN: [id number] Date of Birth: 06/13/1961

## 2022-01-01 ENCOUNTER — Other Ambulatory Visit: Payer: Self-pay

## 2022-01-01 ENCOUNTER — Ambulatory Visit: Payer: 59 | Admitting: Physical Therapy

## 2022-01-01 ENCOUNTER — Other Ambulatory Visit: Payer: Self-pay | Admitting: Surgical

## 2022-01-01 DIAGNOSIS — M25512 Pain in left shoulder: Secondary | ICD-10-CM | POA: Diagnosis not present

## 2022-01-01 DIAGNOSIS — M25511 Pain in right shoulder: Secondary | ICD-10-CM | POA: Diagnosis not present

## 2022-01-01 DIAGNOSIS — S46011D Strain of muscle(s) and tendon(s) of the rotator cuff of right shoulder, subsequent encounter: Secondary | ICD-10-CM | POA: Diagnosis not present

## 2022-01-01 DIAGNOSIS — M25611 Stiffness of right shoulder, not elsewhere classified: Secondary | ICD-10-CM

## 2022-01-01 NOTE — Therapy (Signed)
[place] MAIN [facility] SERVICES [address] [place], [place], [address] Phone: [phone]   Fax:  [phone]  Physical Therapy Treatment  Patient Details  Name: [name] MRN: [id number] Date of Birth: [birth year] No data recorded  Encounter Date: 01/01/2022   PT End of Session - 01/01/22 1216     Visit Number 27    Number of Visits 44    Date for PT Re-Evaluation 02/05/22    Authorization Type last recert/progress note 16/60    Progress Note Due on Visit 30    PT Start Time 1200    PT Stop Time 1230    PT Time Calculation (min) 30 min    Equipment Utilized During Treatment Gait belt    Activity Tolerance Patient tolerated treatment well    Behavior During Therapy WFL for tasks assessed/performed             Past Medical History:  Diagnosis Date   Complication of anesthesia    PONV (postoperative nausea and vomiting)     Past Surgical History:  Procedure Laterality Date   AUGMENTATION MAMMAPLASTY Bilateral 2010   BICEPT TENODESIS Right 09/24/2021   Procedure: BICEPS TENODESIS;  Surgeon: [name], MD;  Location: [facility];  Service: Orthopedics;  Laterality: Right;   CERVICAL SPINE SURGERY  2003   C5-C7- double dissectomy   PAROTID GLAND TUMOR EXCISION     SHOULDER ARTHROSCOPY Right 09/24/2021   Procedure: right shoulder arthroscopy, debridement,open rotator cuff tear repair;  Surgeon: [name], MD;  Location: [facility];  Service: Orthopedics;  Laterality: Right;   WISDOM TOOTH EXTRACTION Bilateral     There were no vitals filed for this visit.   Subjective Assessment - 01/01/22 1201     Subjective Pt rpeots no significant changes since last session.    Pertinent History Pt is a 61 y/o female s/p right shoulder arthroscopy with superior labral debridement, biceps tendon release, subacromial decompression, mini open rotator cuff tear repair and biceps tenodesis - surgery preformed on 09/24/21. She had a  follow-up appt with Dr. [name] on 10/26 - sling was removed this date. MD encourages wearing sling at night, otherwise encourages leaving sling off during the day. Shoulder injury occured in March 2022 - she was using a scooter due to an ankle injury. She fell off of the scooter and onto her right shoulder. 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 Of note, pt also reports C5-C7 anterior fusion in 2003.    Limitations Writing;House hold activities;Lifting;Other (comment);Walking;Standing    How long can you sit comfortably? WFL    How long can you stand comfortably? WFL w/ RUE supported    How long can you walk comfortably? 30 minutes w/o sling    Patient Stated Goals full ROM, eventually return to work Investment banker, corporate at [employer]), be able to use arm w/o pain    Pain Onset More than a month ago             Manual:  Pt in supine-reclined position on mat table wand towel placed under RUE for added support: STM and min AC joint mobs (grade 1/2) to right AC joint. Resulted in increase in ER rom (40 degrees following stretching)  PT performed PROM to RUE all planes for multiple reps: flexion, abduction, , ER; increased focus on ER , held at pain-free end range for multiple bouts of 20-30 sec. -Continued improvement noted in  ER this session following focus on [facility] joint mobility with STM. Measured at 52 degrees (ER)   TherEx: Reclined-supine: PVC pipe shoulder flexion B x10x5sec hold; pt reports some fatigue. Pt reports some popping sensation with eccentric with LUE  PVC ER RUE x10 x 5 sec holds   Standing 3 way shoulder raises with mirror anteriorly for cues to prevent shoulder hiking and equal movement bilaterally  -instructed to add to HEP   Ice x 4 min at end of session- no Charge   Pt educated throughout session about proper posture and technique with exercises. Improved exercise technique, movement at target joints, use of target muscles after  min to mod verbal, visual, tactile cues.                           PT Education - 01/01/22 1215     Education Details Exercise form and technique    Person(s) Educated Patient    Methods Explanation;Demonstration;Tactile cues;Verbal cues    Comprehension Verbalized understanding;Returned demonstration              PT Short Term Goals - 12/03/21 0900       PT SHORT TERM GOAL #1   Title Pt will be independent with HEP in order to improve strength and decrease pain in order to improve pain-free function at home and work.    Baseline 11/18: pt reports compliance with HEP, HEP to be further advanced as per protocol, 12/13: still doing regularly, no issues    Time 2    Period Weeks    Status On-going    Target Date 11/27/21               PT Long Term Goals - 12/11/21 1224       PT LONG TERM GOAL #1   Title Patient will improve right shoulder PROM to > 140 degrees of flexion, scaption, and abduction for improved ability to perform overhead activities.    Baseline 10/18/21: flexion 41*, abd 60*; 11/18: flexoin 96 deg, abduction 81 deg, scaption 104 deg, ER 14 deg (following interventions), 12/13: see flowsheet, 12/21: see flowsheet    Time 8    Period Weeks    Status Partially Met    Target Date 02/05/22      PT LONG TERM GOAL #2   Title Patient will improve right shoulder external rotation in order to perform hygiene/ADL's such as hair care pain-free.    Baseline 10/18/21: ER 0*; 11/18: ER 14 deg, pain limited, 12/13: see flowsheet, 12/21: see flowsheet    Time 8    Period Weeks    Status Partially Met    Target Date 02/05/22      PT LONG TERM GOAL #3   Title Pt will decrease quick DASH score by at least 8% in order to demonstrate clinically significant reduction in disability.    Baseline 10/18/21: 80% 11/18: 70.45%, 12/21: deferred    Time 8    Period Weeks    Status Achieved    Target Date 02/05/22      PT LONG TERM GOAL #4   Title Pt  will decrease worst pain as reported on NPRS by at least 3 points in order to demonstrate clinically significant reduction in pain    Baseline 10/18/21: 10/10 pain; 11/18: 6-7/10,    Time 8    Period Weeks    Status Achieved    Target Date 02/05/22      PT  LONG TERM GOAL #5   Title Patient will increase FOTO score to equal to or greater than 55 to demonstrate statistically significant improvement in mobility and quality of life.    Baseline 10/18/21: 20%; 11/18: 32%, 12/13: deferred, 12/21: 55%    Time 8    Period Weeks    Status Achieved    Target Date 02/05/22      PT LONG TERM GOAL #6   Title Pt will decrease quick DASH score by at least 8% in order to demonstrate clinically significant reduction in disability.    Baseline 11/18: 70%, 12/13: 54.5%    Time 8    Period Weeks    Status Achieved    Target Date 02/05/22      PT LONG TERM GOAL #7   Title Patient will decrease pain VAS to <3/10 for L shoulder to improve quality of life and allow patient to sleep through the night.    Baseline 12/15: 6/10,    Time 8    Period Weeks    Status New    Target Date 02/05/22                   Plan - 01/01/22 1217     Clinical Impression Statement Pt conntinues to dempnstrate improved shoulder range of motion this session with multiple planes of motion including ER and flexion ROM. Progressed with 3 way shoulder active ROM with mirror for feedback on shoulder hiking. Pt instructed to add this to her home program. Pt continues to make good progress with therapy. Pt will continue to benefit from skilled PT intervention in order to ipmrove shoulder ROM, strength and function.    Personal Factors and Comorbidities Age;Time since onset of injury/illness/exacerbation    Examination-Activity Limitations Bathing;Reach Overhead;Dressing;Hygiene/Grooming;Toileting;Lift;Sleep;Carry    Examination-Participation Restrictions Driving;Cleaning;Meal Prep;Community Activity;Laundry;Occupation;Shop     Stability/Clinical Decision Making Stable/Uncomplicated    Rehab Potential Good    PT Frequency 3x / week    PT Duration 8 weeks    PT Treatment/Interventions ADLs/Self Care Home Management;Aquatic Therapy;Biofeedback;Electrical Stimulation;Cryotherapy;Iontophoresis 4mg /ml Dexamethasone;Moist Heat;Traction;Ultrasound;DME Instruction;Functional mobility training;Therapeutic activities;Therapeutic exercise;Neuromuscular re-education;Patient/family education;Manual techniques;Scar mobilization;Passive range of motion;Dry needling;Other (comment);Joint Manipulations    PT Next Visit Plan progress strengthening of R, pain reduction of L, continue POC as previously indicated    PT Home Exercise Plan RUE AAROM - flexion, abduction, ER (using PVC) - no printout; no updates; 11/18 to be performed LUE only Access Code: [email or web address]; 11/22: green theraputty for R grip strength x2 min; provided pt with blue theraputty for grip strengthening    Consulted and Agree with Plan of Care Patient             Patient will benefit from skilled therapeutic intervention in order to improve the following deficits and impairments:  Decreased activity tolerance, Decreased range of motion, Hypomobility, Impaired perceived functional ability, Impaired UE functional use, Improper body mechanics, Pain, Decreased mobility  Visit Diagnosis: Stiffness of right shoulder, not elsewhere classified  Acute pain of left shoulder  Acute pain of right shoulder     Problem List Patient Active Problem List   Diagnosis Date Noted   Adhesive capsulitis of right shoulder    Complete tear of right rotator cuff    Degenerative superior labral anterior-to-posterior (SLAP) tear of right shoulder    Healthcare maintenance 07/31/2021   Congenital pes cavus 02/25/2021   History of uterine fibroid 01/05/2021   Herpes 07/12/2020   GERD (gastroesophageal reflux disease) 05/23/2020   Vaginal atrophy 05/23/2020  Cervical spine  arthritis 05/23/2020   Osteopenia 05/23/2020   Insomnia 05/20/2020   Postmenopausal hormone therapy 05/20/2020    [name], PT 01/01/2022, 12:49 PM  [place] MAIN [facility] SERVICES [address] [place], [place], [address] Phone: [phone]   Fax:  [phone]  Name: [name] MRN: [id number] Date of Birth: [birth year]

## 2022-01-02 ENCOUNTER — Other Ambulatory Visit: Payer: Self-pay

## 2022-01-03 DIAGNOSIS — F520 Hypoactive sexual desire disorder: Secondary | ICD-10-CM | POA: Diagnosis not present

## 2022-01-03 DIAGNOSIS — Z79890 Hormone replacement therapy (postmenopausal): Secondary | ICD-10-CM | POA: Diagnosis not present

## 2022-01-06 ENCOUNTER — Other Ambulatory Visit: Payer: Self-pay

## 2022-01-06 ENCOUNTER — Ambulatory Visit: Payer: 59 | Admitting: Physical Therapy

## 2022-01-06 DIAGNOSIS — S46011D Strain of muscle(s) and tendon(s) of the rotator cuff of right shoulder, subsequent encounter: Secondary | ICD-10-CM

## 2022-01-06 DIAGNOSIS — M25512 Pain in left shoulder: Secondary | ICD-10-CM

## 2022-01-06 DIAGNOSIS — M25611 Stiffness of right shoulder, not elsewhere classified: Secondary | ICD-10-CM

## 2022-01-06 DIAGNOSIS — M25511 Pain in right shoulder: Secondary | ICD-10-CM | POA: Diagnosis not present

## 2022-01-06 MED FILL — Tramadol HCl Tab 50 MG: ORAL | 30 days supply | Qty: 30 | Fill #0 | Status: AC

## 2022-01-06 NOTE — Therapy (Signed)
[place] °[facility] RE[facility] SERVICES °[address] °[place], [place], [address] °Phone: [phone]   Fax:  [phone] ° °Physical Therapy Treatment ° °Patient Details  °Name: [name] °MRN: [id number] °Date of Birth: 08/15/1961 °No data recorded ° °Encounter Date: 01/06/2022 ° ° PT End of Session - 01/06/22 1407   ° ° Visit Number 28   ° Number of Visits 44   ° Date for PT Re-Evaluation 02/05/22   ° Authorization Type last recert/progress note 12/21   ° Progress Note Due on Visit 30   ° PT Start Time 0147   ° PT Stop Time 0230   ° PT Time Calculation (min) 43 min   ° Equipment Utilized During Treatment Gait belt   ° Activity Tolerance Patient tolerated treatment well   ° Behavior During Therapy WFL for tasks assessed/performed   ° °  °  ° °  ° ° °Past Medical History:  °Diagnosis Date  ° Complication of anesthesia   ° PONV (postoperative nausea and vomiting)   ° ° °Past Surgical History:  °Procedure Laterality Date  ° AUGMENTATION MAMMAPLASTY Bilateral 2010  ° BICEPT TENODESIS Right 09/24/2021  ° Procedure: BICEPS TENODESIS;  Surgeon: Dean, Gregory Scott, MD;  Location: [facility];  Service: Orthopedics;  Laterality: Right;  ° CERVICAL SPINE SURGERY  2003  ° C5-C7- double dissectomy  ° PAROTID GLAND TUMOR EXCISION    ° SHOULDER ARTHROSCOPY Right 09/24/2021  ° Procedure: right shoulder arthroscopy, debridement,open rotator cuff tear repair;  Surgeon: Dean, Gregory Scott, MD;  Location: [facility];  Service: Orthopedics;  Laterality: Right;  ° WISDOM TOOTH EXTRACTION Bilateral   ° ° °There were no vitals filed for this visit. ° ° Subjective Assessment - 01/06/22 1701   ° ° Subjective Pt reports continued improvement in ER range of motion and also reports some difficulties with IR range of motion.   ° Pertinent History Pt is a 60 y/o female s/p right shoulder arthroscopy with superior labral debridement, biceps tendon release, subacromial decompression, mini open rotator cuff tear repair and  biceps tenodesis - surgery preformed on 09/24/21. She had a follow-up appt with Dr. Dean on 10/26 - sling was removed this date. MD encourages wearing sling at night, otherwise encourages leaving sling off during the day. Shoulder injury occured in March 2022 - she was using a scooter due to an ankle injury. She fell off of the scooter and onto her right shoulder. 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 Of note, pt also reports C5-C7 anterior fusion in 2003.   ° Limitations Writing;House hold activities;Lifting;Other (comment);Walking;Standing   ° How long can you sit comfortably? WFL   ° How long can you stand comfortably? WFL w/ RUE supported   ° How long can you walk comfortably? 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 As well as to surrounding musculature.  °PT performed PROM to RUE all planes for multiple reps: flexion, abduction, , ER; increased focus on ER , held at pain-free end range for multiple bouts   of 20-30 sec. °-Continued improvement noted in ER this session following focus on AC joint mobility with STM.  ° °TherEx: °Reclined-supine: °PVC pipe shoulder flexion B x 15x5sec hold; pt reports some fatigue. Pt reports some popping sensation with eccentric with LUE  °PVC ER RUE x15 x 5 sec holds °1lb seratus punch with flexion/extension- lowering shoulder from flexion to neutral and back x 10  ° °Seated  °IR stretch with strap 2 x 30 sec  °-discomfort on 1st reps, instructed to ease pressure to gentle but effective stretch  ° ° °Standing 3 way shoulder raises (flex, scapt, ABD) with mirror anteriorly for cues to prevent shoulder hiking and promote equal  movement bilaterally  °-instructed to add to HEP  ° °[employer]one stacking °2 x 5 cones from bottom shelf in linen cabinet to middle shelf in linen cabinet  °-notes muscular fatigue with this activity, no increase in pain.  ° °Ice x 4 min at end of session- no Charge  ° °Pt educated throughout session about proper posture and technique with exercises. Improved exercise technique, movement at target joints, use of target muscles after min to mod verbal, visual, tactile cues. ° ° ° ° ° ° ° ° ° ° ° ° ° ° ° ° ° ° ° ° ° ° ° ° ° ° ° ° ° ° ° ° ° ° ° ° PT Education - 01/06/22 1703   ° ° Education Details Exercise form and technique   ° Person(s) Educated Patient   ° Methods Explanation;Demonstration;Tactile cues;Verbal cues   ° Comprehension Verbalized understanding;Returned demonstration   ° °  °  ° °  ° ° ° PT Short Term Goals - 12/03/21 0900   ° °  ° PT SHORT TERM GOAL #1  ° Title Pt will be independent with HEP in order to improve strength and decrease pain in order to improve pain-free function at home and work.   ° Baseline 11/18: pt reports compliance with HEP, HEP to be further advanced as per protocol, 12/13: still doing regularly, no issues   ° Time 2   ° Period Weeks   ° Status On-going   ° Target Date 11/27/21   ° °  °  ° °  ° ° ° ° PT Long Term Goals - 12/11/21 1224   ° °  ° PT LONG TERM GOAL #1  ° Title Patient will improve right shoulder PROM to > 140 degrees of flexion, scaption, and abduction for improved ability to perform overhead activities.   ° Baseline 10/18/21: flexion 41*, abd 60*; 11/18: flexoin 96 deg, abduction 81 deg, scaption 104 deg, ER 14 deg (following interventions), 12/13: see flowsheet, 12/21: see flowsheet   ° Time 8   ° Period Weeks   ° Status Partially Met   ° Target Date 02/05/22   °  ° PT LONG TERM GOAL #2  ° Title Patient will improve right shoulder external rotation in order to perform hygiene/ADL's such as hair care pain-free.   ° Baseline 10/18/21: ER 0*; 11/18: ER 14 deg, pain limited,  12/13: see flowsheet, 12/21: see flowsheet   ° Time 8   ° Period Weeks   ° Status Partially Met   ° Target Date 02/05/22   °  ° PT LONG TERM GOAL #3  ° Title Pt will decrease quick DASH score by at least 8% in order to demonstrate clinically significant reduction in disability.   ° Baseline 10/18/21: 80% 11/18: 70.45%, 12/21: deferred   °   Time 8    Period Weeks    Status Achieved    Target Date 02/05/22      PT LONG TERM GOAL #4   Title Pt will decrease worst pain as reported on NPRS by at least 3 points in order to demonstrate clinically significant reduction in pain    Baseline 10/18/21: 10/10 pain; 11/18: 6-7/10,    Time 8    Period Weeks    Status Achieved    Target Date 02/05/22      PT LONG TERM GOAL #5   Title Patient will increase FOTO score to equal to or greater than 55 to demonstrate statistically significant improvement in mobility and quality of life.    Baseline 10/18/21: 20%; 11/18: 32%, 12/13: deferred, 12/21: 55%    Time 8    Period Weeks    Status Achieved    Target Date 02/05/22      PT LONG TERM GOAL #6   Title Pt will decrease quick DASH score by at least 8% in order to demonstrate clinically significant reduction in disability.    Baseline 11/18: 70%, 12/13: 54.5%    Time 8    Period Weeks    Status Achieved    Target Date 02/05/22      PT LONG TERM GOAL #7   Title Patient will decrease pain VAS to <3/10 for L shoulder to improve quality of life and allow patient to sleep through the night.    Baseline 12/15: 6/10,    Time 8    Period Weeks    Status New    Target Date 02/05/22                   Plan - 01/06/22 1406     Clinical Impression Statement Pt continues to demonstrate improved shoulder range of motion this session with multiple planes of motion including ER and flexion ROM. Progressed with 3 way shoulder active ROM to increased heights with mirror for feedback on shoulder hiking and equal movement. Also progressed with several other  exercsies in difficulties and with function. Pt continues to make good progress with therapy. Pt will continue to benefit from skilled PT intervention in order to ipmrove shoulder ROM, strength and function.    Personal Factors and Comorbidities Age;Time since onset of injury/illness/exacerbation    Examination-Activity Limitations Bathing;Reach Overhead;Dressing;Hygiene/Grooming;Toileting;Lift;Sleep;Carry    Examination-Participation Restrictions Driving;Cleaning;Meal Prep;Community Activity;Laundry;Occupation;Shop    Stability/Clinical Decision Making Stable/Uncomplicated    Rehab Potential Good    PT Frequency 3x / week    PT Duration 8 weeks    PT Treatment/Interventions ADLs/Self Care Home Management;Aquatic Therapy;Biofeedback;Electrical Stimulation;Cryotherapy;Iontophoresis 90m/ml Dexamethasone;Moist Heat;Traction;Ultrasound;DME Instruction;Functional mobility training;Therapeutic activities;Therapeutic exercise;Neuromuscular re-education;Patient/family education;Manual techniques;Scar mobilization;Passive range of motion;Dry needling;Other (comment);Joint Manipulations    PT Next Visit Plan progress strengthening of R, pain reduction of L, continue POC as previously indicated    PT Home Exercise Plan RUE AAROM - flexion, abduction, ER (using PVC) - no printout; no updates; 11/18 to be performed LUE only Access Code: [email or web address]; 11/22: green theraputty for R grip strength x2 min; provided pt with blue theraputty for grip strengthening    Consulted and Agree with Plan of Care Patient             Patient will benefit from skilled therapeutic intervention in order to improve the following deficits and impairments:  Decreased activity tolerance, Decreased range of motion, Hypomobility, Impaired perceived functional ability, Impaired UE functional use, Improper body mechanics, Pain, Decreased mobility  Visit Diagnosis:  Stiffness of right shoulder, not elsewhere classified ° °Acute pain of left  shoulder ° °Acute pain of right shoulder ° °Traumatic complete tear of right rotator cuff, subsequent encounter ° ° ° ° °Problem List °Patient Active Problem List  ° Diagnosis Date Noted  ° Adhesive capsulitis of right shoulder   ° Complete tear of right rotator cuff   ° Degenerative superior labral anterior-to-posterior (SLAP) tear of right shoulder   ° Healthcare maintenance 07/31/2021  ° Congenital pes cavus 02/25/2021  ° History of uterine fibroid 01/05/2021  ° Herpes 07/12/2020  ° GERD (gastroesophageal reflux disease) 05/23/2020  ° Vaginal atrophy 05/23/2020  ° Cervical spine arthritis 05/23/2020  ° Osteopenia 05/23/2020  ° Insomnia 05/20/2020  ° Postmenopausal hormone therapy 05/20/2020  ° ° °Christopher B Byrd, PT °01/06/2022, 5:08 PM ° °[place] °[facility] °[address] °[place], , [address] °Phone: [phone]   Fax:  [phone] ° °Name: [name] °MRN: [id number] °Date of Birth: 01/09/1961 ° ° ° °

## 2022-01-07 ENCOUNTER — Other Ambulatory Visit: Payer: Self-pay | Admitting: Surgical

## 2022-01-07 ENCOUNTER — Other Ambulatory Visit: Payer: Self-pay

## 2022-01-07 MED FILL — Gabapentin Cap 300 MG: ORAL | 20 days supply | Qty: 60 | Fill #0 | Status: AC

## 2022-01-08 ENCOUNTER — Ambulatory Visit: Payer: 59 | Admitting: Orthopedic Surgery

## 2022-01-08 ENCOUNTER — Other Ambulatory Visit: Payer: Self-pay

## 2022-01-08 DIAGNOSIS — M75121 Complete rotator cuff tear or rupture of right shoulder, not specified as traumatic: Secondary | ICD-10-CM

## 2022-01-09 ENCOUNTER — Telehealth: Payer: Self-pay

## 2022-01-09 ENCOUNTER — Telehealth: Payer: Self-pay | Admitting: Orthopedic Surgery

## 2022-01-09 ENCOUNTER — Ambulatory Visit: Payer: 59 | Admitting: Physical Therapy

## 2022-01-09 DIAGNOSIS — M25512 Pain in left shoulder: Secondary | ICD-10-CM

## 2022-01-09 DIAGNOSIS — M25611 Stiffness of right shoulder, not elsewhere classified: Secondary | ICD-10-CM | POA: Diagnosis not present

## 2022-01-09 DIAGNOSIS — M25511 Pain in right shoulder: Secondary | ICD-10-CM

## 2022-01-09 DIAGNOSIS — S46011D Strain of muscle(s) and tendon(s) of the rotator cuff of right shoulder, subsequent encounter: Secondary | ICD-10-CM | POA: Diagnosis not present

## 2022-01-09 NOTE — Therapy (Signed)
[place] [facility] SERVICES [address] [place], [place], [address] Phone: [phone]   Fax:  [phone]  Physical Therapy Treatment  Patient Details  Name: [name] MRN: [id number] Date of Birth: 06-24-1961 No data recorded  Encounter Date: 01/09/2022   PT End of Session - 01/09/22 0949     Visit Number 29    Number of Visits 44    Date for PT Re-Evaluation 02/05/22    Authorization Type last recert/progress note 58/85    Progress Note Due on Visit 30    PT Start Time 0931    PT Stop Time 1014    PT Time Calculation (min) 43 min    Equipment Utilized During Treatment Gait belt    Activity Tolerance Patient tolerated treatment well    Behavior During Therapy WFL for tasks assessed/performed             Past Medical History:  Diagnosis Date   Complication of anesthesia    PONV (postoperative nausea and vomiting)     Past Surgical History:  Procedure Laterality Date   AUGMENTATION MAMMAPLASTY Bilateral 2010   BICEPT TENODESIS Right 09/24/2021   Procedure: BICEPS TENODESIS;  Surgeon: [name], MD;  Location: [place];  Service: Orthopedics;  Laterality: Right;   CERVICAL SPINE SURGERY  2003   C5-C7- double dissectomy   PAROTID GLAND TUMOR EXCISION     SHOULDER ARTHROSCOPY Right 09/24/2021   Procedure: right shoulder arthroscopy, debridement,open rotator cuff tear repair;  Surgeon: [name], MD;  Location: [place];  Service: Orthopedics;  Laterality: Right;   WISDOM TOOTH EXTRACTION Bilateral     There were no vitals filed for this visit.   Subjective Assessment - 01/09/22 0934     Subjective Pt reports continued improvement in ER range of motion and also reports some difficulties with IR range of motion and activities such as putting her har up is still difficult. Pt reports she will soon have a new insurance from her new job and was concerned regarding continuation of therapy with new insurance.     Pertinent History Pt is a 61 y/o female s/p right shoulder arthroscopy with superior labral debridement, biceps tendon release, subacromial decompression, mini open rotator cuff tear repair and biceps tenodesis - surgery preformed on 09/24/21. She had a follow-up appt with Dr. [name] on 10/26 - sling was removed this date. MD encourages wearing sling at night, otherwise encourages leaving sling off during the day. Shoulder injury occured in March 2022 - she was using a scooter due to an ankle injury. She fell off of the scooter and onto her right shoulder. 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 Of note, pt also reports C5-C7 anterior fusion in 2003.    Limitations Writing;House hold activities;Lifting;Other (comment);Walking;Standing    How long can you sit comfortably? WFL    How long can you stand comfortably? WFL w/ RUE supported    How long can you walk comfortably? 30 minutes w/o sling    Patient Stated Goals full ROM, eventually return to work Investment banker, corporate at [employer]), be able to use arm w/o pain    Pain Score 2     Pain Orientation Right    Pain Descriptors / Indicators Sore    Pain Onset More than a month ago            Manual:  Pt in supine position on mat table  with towel placed under RUE for added support: STM and AC joint mobs (grade 1/2) to right AC joint and surrounding musculature.  PT performed PROM to RUE all planes for multiple reps: flexion, abduction, , ER; increased focus on ER and IR , held at pain-free end range for multiple bouts of 20-30 sec.  TherEx: Reclined-supine: PVC pipe shoulder flexion B x 10x 5sec hold; pt reports some fatigue. Pt reports some popping sensation with eccentric with LUE  PVC ER RUE x 10 x 5 sec holds  1lb seratus punch with flexion/extension (0-90 degrees)- lowering shoulder from flexion to neutral and back x 10   Seated  SL IR stretch 2 x 30 sec  -Improved comfort compared to IR strap  stretch last sesison   Standing 3 way shoulder raises (flex, scapt, ABD) with mirror anteriorly for cues to prevent shoulder hiking and promote equal movement bilaterally  -instructed to add to HEP   [facility] stacking 3 x 4 cones from bottom shelf in linen cabinet to middle shelf in linen cabinet  -notes muscular fatigue with this activity, no increase in pain.   Ice x 4 min at end of session- no Charge   Pt educated throughout session about proper posture and technique with exercises. Improved exercise technique, movement at target joints, use of target muscles after min to mod verbal, visual, tactile cues.                            PT Education - 01/09/22 0949     Education Details exercise form and technique    Person(s) Educated Patient    Methods Explanation;Demonstration    Comprehension Verbalized understanding              PT Short Term Goals - 12/03/21 0900       PT SHORT TERM GOAL #1   Title Pt will be independent with HEP in order to improve strength and decrease pain in order to improve pain-free function at home and work.    Baseline 11/18: pt reports compliance with HEP, HEP to be further advanced as per protocol, 12/13: still doing regularly, no issues    Time 2    Period Weeks    Status On-going    Target Date 11/27/21               PT Long Term Goals - 12/11/21 1224       PT LONG TERM GOAL #1   Title Patient will improve right shoulder PROM to > 140 degrees of flexion, scaption, and abduction for improved ability to perform overhead activities.    Baseline 10/18/21: flexion 41*, abd 60*; 11/18: flexoin 96 deg, abduction 81 deg, scaption 104 deg, ER 14 deg (following interventions), 12/13: see flowsheet, 12/21: see flowsheet    Time 8    Period Weeks    Status Partially Met    Target Date 02/05/22      PT LONG TERM GOAL #2   Title Patient will improve right shoulder external rotation in order to perform hygiene/ADL's such  as hair care pain-free.    Baseline 10/18/21: ER 0*; 11/18: ER 14 deg, pain limited, 12/13: see flowsheet, 12/21: see flowsheet    Time 8    Period Weeks    Status Partially Met    Target Date 02/05/22      PT LONG TERM GOAL #3   Title Pt will decrease quick DASH score by at least  8% in order to demonstrate clinically significant reduction in disability.    Baseline 10/18/21: 80% 11/18: 70.45%, 12/21: deferred    Time 8    Period Weeks    Status Achieved    Target Date 02/05/22      PT LONG TERM GOAL #4   Title Pt will decrease worst pain as reported on NPRS by at least 3 points in order to demonstrate clinically significant reduction in pain    Baseline 10/18/21: 10/10 pain; 11/18: 6-7/10,    Time 8    Period Weeks    Status Achieved    Target Date 02/05/22      PT LONG TERM GOAL #5   Title Patient will increase FOTO score to equal to or greater than 55 to demonstrate statistically significant improvement in mobility and quality of life.    Baseline 10/18/21: 20%; 11/18: 32%, 12/13: deferred, 12/21: 55%    Time 8    Period Weeks    Status Achieved    Target Date 02/05/22      PT LONG TERM GOAL #6   Title Pt will decrease quick DASH score by at least 8% in order to demonstrate clinically significant reduction in disability.    Baseline 11/18: 70%, 12/13: 54.5%    Time 8    Period Weeks    Status Achieved    Target Date 02/05/22      PT LONG TERM GOAL #7   Title Patient will decrease pain VAS to <3/10 for L shoulder to improve quality of life and allow patient to sleep through the night.    Baseline 12/15: 6/10,    Time 8    Period Weeks    Status New    Target Date 02/05/22                   Plan - 01/09/22 0950     Clinical Impression Statement Pt continues to demonstrate improved shoulder range of motion this session with multiple planes of motion including ER and flexion ROM. Continued with 3 way shoulder active ROM to increased heights with mirror for  feedback on shoulder hiking and equal movement. Pt was unformed of several exercise progressions from her current level as well as reasoning for progressions as she is capable. Pt reports she will soon have a new insurance from her new job and was concerned regarding continuation of therapy with new insurance.  Pt continues to make good progress with therapy. Pt will continue to benefit from skilled PT intervention in order to ipmrove shoulder ROM, strength and function.    Personal Factors and Comorbidities Age;Time since onset of injury/illness/exacerbation    Examination-Activity Limitations Bathing;Reach Overhead;Dressing;Hygiene/Grooming;Toileting;Lift;Sleep;Carry    Examination-Participation Restrictions Driving;Cleaning;Meal Prep;Community Activity;Laundry;Occupation;Shop    Stability/Clinical Decision Making Stable/Uncomplicated    Rehab Potential Good    PT Frequency 3x / week    PT Duration 8 weeks    PT Treatment/Interventions ADLs/Self Care Home Management;Aquatic Therapy;Biofeedback;Electrical Stimulation;Cryotherapy;Iontophoresis 82m/ml Dexamethasone;Moist Heat;Traction;Ultrasound;DME Instruction;Functional mobility training;Therapeutic activities;Therapeutic exercise;Neuromuscular re-education;Patient/family education;Manual techniques;Scar mobilization;Passive range of motion;Dry needling;Other (comment);Joint Manipulations    PT Next Visit Plan progress strengthening of R, pain reduction of L, continue POC as previously indicated    PT Home Exercise Plan RUE AAROM - flexion, abduction, ER (using PVC) - no printout; no updates; 11/18 to be performed LUE only Access Code: [email or web address]; 11/22: green theraputty for R grip strength x2 min; provided pt with blue theraputty for grip strengthening    Consulted and Agree with Plan  of Care Patient             Patient will benefit from skilled therapeutic intervention in order to improve the following deficits and impairments:  Decreased  activity tolerance, Decreased range of motion, Hypomobility, Impaired perceived functional ability, Impaired UE functional use, Improper body mechanics, Pain, Decreased mobility  Visit Diagnosis: Stiffness of right shoulder, not elsewhere classified  Acute pain of left shoulder  Acute pain of right shoulder  Traumatic complete tear of right rotator cuff, subsequent encounter     Problem List Patient Active Problem List   Diagnosis Date Noted   Adhesive capsulitis of right shoulder    Complete tear of right rotator cuff    Degenerative superior labral anterior-to-posterior (SLAP) tear of right shoulder    Healthcare maintenance 07/31/2021   Congenital pes cavus 02/25/2021   History of uterine fibroid 01/05/2021   Herpes 07/12/2020   GERD (gastroesophageal reflux disease) 05/23/2020   Vaginal atrophy 05/23/2020   Cervical spine arthritis 05/23/2020   Osteopenia 05/23/2020   Insomnia 05/20/2020   Postmenopausal hormone therapy 05/20/2020    [name], PT 01/09/2022, 10:25 AM  [facility] [address] [place], [place], [address] Phone: [phone]   Fax:  [phone]  Name: [name] MRN: [id number] Date of Birth: [birth year]

## 2022-01-09 NOTE — Telephone Encounter (Signed)
I have communicated with patient via mychart

## 2022-01-09 NOTE — Telephone Encounter (Signed)
LVM for patient to return my call. Patient is scheduled with Dr. Laurence Ferrari 3/30 for [place] follow up and I would like to move her appt to 2/21 or 2/28./js

## 2022-01-09 NOTE — Telephone Encounter (Signed)
Pt called requesting a call back from Renato Gails or [place] states she received a letter for out of work note but need note to say when return to work. Please call pt at [phone]. Pt states can it please been done today because it needs to be submitted by tomorrow.

## 2022-01-09 NOTE — Telephone Encounter (Signed)
Dr. [name] pt.

## 2022-01-10 ENCOUNTER — Telehealth: Payer: Self-pay | Admitting: Orthopedic Surgery

## 2022-01-10 NOTE — Telephone Encounter (Signed)
Patient called. Says the letter with the restrictions on it, needs a date of the end of the restrictions. The one with 15lb. Her job needs a end date for that. She needs it today 01/10/2022. Her call back number is [phone]

## 2022-01-10 NOTE — Telephone Encounter (Signed)
New letter generated. Tried calling patient to advise-went straight to VM

## 2022-01-11 NOTE — Telephone Encounter (Signed)
End date 3 mos for cone letter and 4 months for other thx

## 2022-01-12 ENCOUNTER — Encounter: Payer: Self-pay | Admitting: Orthopedic Surgery

## 2022-01-12 NOTE — Progress Notes (Signed)
Post-Op Visit Note   Patient: [name]           Date of Birth: 1961-05-14           MRN: [id number] Visit Date: 01/08/2022 PCP: Venita Lick, NP   Assessment & Plan:  Chief Complaint:  Chief Complaint  Patient presents with   Right Shoulder - Follow-up    right shoulder rotator cuff tear repair and biceps tenodesis on 09/24/2021   Visit Diagnoses: No diagnosis found.  Plan: Patient is a 61 year old female who presents s/p right shoulder rotator cuff tear repair and biceps tenodesis on 09/24/2021.  She is doing well with no difficulty according to her.  Still going to physical therapy 2 times per week.  She is able to sleep flat and able to sleep on her sides.  She states that she has been measured at 50 degrees of external rotation in her right shoulder and physical therapy which is a significant improvement for her.  She has discontinued tramadol altogether.  Only taking gabapentin and Celebrex and Robaxin for pain control and is planning to wean off of these medications as well.  She works as a Marine scientist on the floor and is planning on returning to work on Monday.  In physical therapy she has mostly been working on active range of motion and strengthening exercises.  She feels her range of motion and strength with lifting is continually improving and she has not plateaued yet.  On exam she has 45 degrees external rotation, 85 degrees abduction, 120 degrees forward flexion.  She has well-healed incisions from prior surgery.  Axillary nerve intact with deltoid firing.  Excellent rotator cuff strength of supra, infra, subscap rated 5/5 with no significant difference compared with the nonoperative arm.  She does have slight coarseness over the region of the incision which is detectable with passive motion of the shoulder.  This is not painful for her.  Plan is for patient to return to work on Monday with no overhead lifting of the right arm and no lifting more than 15 pounds.  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  She will follow-up with the office as needed.  Follow-Up Instructions: No follow-ups on file.   Orders:  No orders of the defined types were placed in this encounter.  No orders of the defined types were placed in this encounter.   Imaging: No results found.  PMFS History: Patient Active Problem List   Diagnosis Date Noted   Adhesive capsulitis of right shoulder    Complete tear of right rotator cuff    Degenerative superior labral anterior-to-posterior (SLAP) tear of right shoulder    Healthcare maintenance 07/31/2021   Congenital pes cavus 02/25/2021   History of uterine fibroid 01/05/2021   Herpes 07/12/2020   GERD (gastroesophageal reflux disease) 05/23/2020   Vaginal atrophy 05/23/2020   Cervical spine arthritis 05/23/2020   Osteopenia 05/23/2020   Insomnia 05/20/2020   Postmenopausal hormone therapy 05/20/2020   Past Medical History:  Diagnosis Date   Complication of anesthesia    PONV (postoperative nausea and vomiting)     Family History  Problem Relation Age of Onset   Lung cancer Mother    Thyroid disease Mother  Hypertension Mother    Hyperlipidemia Mother    CAD Mother        stent x 1   Osteoporosis Mother    Leukemia Father 67   Rheum arthritis Sister    Melanoma Sister    Osteopenia Sister    Autoimmune disease Daughter    Colon cancer Maternal Uncle    Heart disease Maternal Grandmother    Colon cancer Maternal Grandfather    Heart disease Maternal Grandfather    Stroke Maternal Grandfather    Heart disease Paternal Grandfather     Past Surgical History:  Procedure Laterality Date   AUGMENTATION MAMMAPLASTY Bilateral 2010   BICEPT TENODESIS Right 09/24/2021   Procedure: BICEPS  TENODESIS;  Surgeon: [name], MD;  Location: [facility];  Service: Orthopedics;  Laterality: Right;   CERVICAL SPINE SURGERY  2003   C5-C7- double dissectomy   PAROTID GLAND TUMOR EXCISION     SHOULDER ARTHROSCOPY Right 09/24/2021   Procedure: right shoulder arthroscopy, debridement,open rotator cuff tear repair;  Surgeon: [name], MD;  Location: [facility];  Service: Orthopedics;  Laterality: Right;   WISDOM TOOTH EXTRACTION Bilateral    Social History   Occupational History   Not on file  Tobacco Use   Smoking status: Never   Smokeless tobacco: Never  Vaping Use   Vaping Use: Never used  Substance and Sexual Activity   Alcohol use: Yes    Comment: ocassional   Drug use: Never   Sexual activity: Yes

## 2022-01-14 ENCOUNTER — Other Ambulatory Visit: Payer: Self-pay

## 2022-01-14 ENCOUNTER — Ambulatory Visit: Payer: 59 | Admitting: Physical Therapy

## 2022-01-15 ENCOUNTER — Encounter: Payer: Self-pay | Admitting: Nurse Practitioner

## 2022-01-15 ENCOUNTER — Telehealth (INDEPENDENT_AMBULATORY_CARE_PROVIDER_SITE_OTHER): Payer: 59 | Admitting: Nurse Practitioner

## 2022-01-15 ENCOUNTER — Other Ambulatory Visit: Payer: Self-pay

## 2022-01-15 DIAGNOSIS — R051 Acute cough: Secondary | ICD-10-CM | POA: Diagnosis not present

## 2022-01-15 DIAGNOSIS — R059 Cough, unspecified: Secondary | ICD-10-CM | POA: Insufficient documentation

## 2022-01-15 MED ORDER — PREDNISONE 20 MG PO TABS
40.0000 mg | ORAL_TABLET | Freq: Every day | ORAL | 0 refills | Status: AC
  Filled 2022-01-15: qty 10, 5d supply, fill #0

## 2022-01-15 MED ORDER — AZITHROMYCIN 250 MG PO TABS
ORAL_TABLET | ORAL | 0 refills | Status: AC
  Filled 2022-01-15: qty 6, 5d supply, fill #0

## 2022-01-15 NOTE — Telephone Encounter (Signed)
Pt added

## 2022-01-15 NOTE — Assessment & Plan Note (Signed)
Acute and ongoing 5 days at this time with negative Covid testing at home.  Suspect more viral turning into bacterial cough at this time as ongoing progression.  Will send in Prednisone 40 MG x 5 days and Zpack at this time -- she will hold Zpack until Friday and if no improvement take it.  Continue OTC symptoms management.  Recommend: - Increased rest - Increasing Fluids - Acetaminophen as needed for fever/pain.  - Salt water gargling, chloraseptic spray and throat lozenges - Mucinex.  - Humidifying the air Return if worsening or ongoing symptoms present.

## 2022-01-15 NOTE — Progress Notes (Signed)
There were no vitals taken for this visit.   Subjective:    Patient ID: [name], female    DOB: 03-06-1961, 61 y.o.   MRN: [id number]  HPI: [name] is a 61 y.o. female  Chief Complaint  Patient presents with   Cough    Patient states at night and in the morning she think she may have Bronchitis. Patient states she started itchiness in her throat on Sunday night. Then scratchy sore throat on Monday. Patient states today her throat doesn't hurt and she feels irritation in her upper bronchial area. Patient state she has tried Sudafed, nasal spray. Patient states she does not think she is carrying a fever. Patient states she took an at-home covid test yesterday and it was negative.    Nasal Congestion    Patient states she is coughing up clear to yellow mucus, but denies any green mucus.    This visit was completed via video visit through MyChart due to the restrictions of the COVID-19 pandemic. All issues as above were discussed and addressed. Physical exam was done as above through visual confirmation on video through MyChart. If it was felt that the patient should be evaluated in the office, they were directed there. The patient verbally consented to this visit. Location of the patient: home Location of the provider: work Those involved with this call:  Provider: Marnee Guarneri, DNP CMA: Irena Reichmann, [place] Desk/Registration: [employer]  Time spent on call:  21 minutes with patient face to face via video conference. More than 50% of this time was spent in counseling and coordination of care. 15 minutes total spent in review of patient's record and preparation of their chart.  I verified patient identity using two factors (patient name and date of birth). Patient consents verbally to being seen via telemedicine visit today.    UPPER RESPIRATORY TRACT INFECTION Started to feel bad Sunday night with itchy throat, then Monday took Sudafed and Tylenol, but started feeling  worse.  Now is more bronchial in nature.  Did home Covid test and this was negative.  This morning woke-up and had burning chest with coughing. Worst symptom: cough Fever: no Cough: yes Shortness of breath: no Wheezing: no Chest pain: no Chest tightness: yes Chest congestion: yes Nasal congestion: yes Runny nose: yes Post nasal drip: yes Sneezing: no Sore throat: a little yesterday Swollen glands: no Sinus pressure: no Headache: no Face pain: no Toothache: no Ear pain: none Ear pressure: yes bilateral Eyes red/itching:no Eye drainage/crusting: yes  Vomiting: no Rash: no Fatigue: yes Sick contacts: yes Strep contacts: no  Context: fluctuating Recurrent sinusitis: no Relief with OTC cold/cough medications: yes  Treatments attempted: cold/sinus and mucinex   Relevant past medical, surgical, family and social history reviewed and updated as indicated. Interim medical history since our last visit reviewed. Allergies and medications reviewed and updated.  Review of Systems  Constitutional:  Positive for fatigue. Negative for activity change, appetite change, chills and fever.  HENT:  Positive for congestion, postnasal drip, rhinorrhea, sinus pressure and sore throat. Negative for ear discharge, ear pain, facial swelling, sinus pain, sneezing and voice change.   Respiratory:  Positive for cough and chest tightness. Negative for shortness of breath and wheezing.   Cardiovascular:  Negative for chest pain, palpitations and leg swelling.  Gastrointestinal: Negative.   Musculoskeletal:  Positive for myalgias.  Neurological:  Negative for dizziness, numbness and headaches.  Psychiatric/Behavioral: Negative.     Per HPI unless specifically indicated  above     Objective:    There were no vitals taken for this visit.  Wt Readings from Last 3 Encounters:  09/24/21 136 lb (61.7 kg)  09/11/21 140 lb 8 oz (63.7 kg)  07/31/21 139 lb 12.8 oz (63.4 kg)    Physical Exam Vitals and  nursing note reviewed.  Constitutional:      General: She is awake. She is not in acute distress.    Appearance: She is well-developed. She is not ill-appearing.  HENT:     Head: Normocephalic.     Right Ear: Hearing normal.     Left Ear: Hearing normal.  Eyes:     General: Lids are normal.        Right eye: No discharge.        Left eye: No discharge.     Conjunctiva/sclera: Conjunctivae normal.  Pulmonary:     Effort: Pulmonary effort is normal. No accessory muscle usage or respiratory distress.  Musculoskeletal:     Cervical back: Normal range of motion.  Neurological:     Mental Status: She is alert and oriented to person, place, and time.  Psychiatric:        Attention and Perception: Attention normal.        Mood and Affect: Mood normal.        Behavior: Behavior normal. Behavior is cooperative.        Thought Content: Thought content normal.        Judgment: Judgment normal.   Results for orders placed or performed during the hospital encounter of 09/11/21  CBC  Result Value Ref Range   WBC 4.7 4.0 - 10.5 K/uL   RBC 4.67 3.87 - 5.11 MIL/uL   Hemoglobin 13.4 12.0 - 15.0 g/dL   HCT 41.7 36.0 - 46.0 %   MCV 89.3 80.0 - 100.0 fL   MCH 28.7 26.0 - 34.0 pg   MCHC 32.1 30.0 - 36.0 g/dL   RDW 12.1 11.5 - 15.5 %   Platelets 196 150 - 400 K/uL   nRBC 0.0 0.0 - 0.2 %  Basic metabolic panel  Result Value Ref Range   Sodium 136 135 - 145 mmol/L   Potassium 4.0 3.5 - 5.1 mmol/L   Chloride 100 98 - 111 mmol/L   CO2 28 22 - 32 mmol/L   Glucose, Bld 110 (H) 70 - 99 mg/dL   BUN 17 6 - 20 mg/dL   Creatinine, Ser 0.74 0.44 - 1.00 mg/dL   Calcium 9.5 8.9 - 10.3 mg/dL   GFR, Estimated >60 >60 mL/min   Anion gap 8 5 - 15      Assessment & Plan:   Problem List Items Addressed This Visit       Other   Cough    Acute and ongoing 5 days at this time with negative Covid testing at home.  Suspect more viral turning into bacterial cough at this time as ongoing progression.  Will  send in Prednisone 40 MG x 5 days and Zpack at this time -- she will hold Zpack until Friday and if no improvement take it.  Continue OTC symptoms management.  Recommend: - Increased rest - Increasing Fluids - Acetaminophen as needed for fever/pain.  - Salt water gargling, chloraseptic spray and throat lozenges - Mucinex.  - Humidifying the air Return if worsening or ongoing symptoms present.       I discussed the assessment and treatment plan with the patient. The patient was provided an opportunity  to ask questions and all were answered. The patient agreed with the plan and demonstrated an understanding of the instructions.   The patient was advised to call back or seek an in-person evaluation if the symptoms worsen or if the condition fails to improve as anticipated.   I provided 21+ minutes of time during this encounter.   Follow up plan: Return if symptoms worsen or fail to improve.

## 2022-01-15 NOTE — Patient Instructions (Signed)
Acute Bronchitis, Adult ?Acute bronchitis is when air tubes in the lungs (bronchi) suddenly get swollen. The condition can make it hard for you to breathe. In adults, acute bronchitis usually goes away within 2 weeks. A cough caused by bronchitis may last up to 3 weeks. Smoking, allergies, and asthma can make the condition worse. ?What are the causes? ?Germs that cause cold and flu (viruses). The most common cause of this condition is the virus that causes the common cold. ?Bacteria. ?Substances that bother (irritate) the lungs, including: ?Smoke from cigarettes and other types of tobacco. ?Dust and pollen. ?Fumes from chemicals, gases, or burned fuel. ?Indoor or outdoor air pollution. ?What increases the risk? ?A weak body's defense system. This is also called the immune system. ?Any condition that affects your lungs and breathing, such as asthma. ?What are the signs or symptoms? ?A cough. ?Coughing up clear, yellow, or green mucus. ?Making high-pitched whistling sounds when you breathe, most often when you breathe out (wheezing). ?Runny or stuffy nose. ?Having too much mucus in your lungs (chest congestion). ?Shortness of breath. ?Body aches. ?A sore throat. ?How is this treated? ?Acute bronchitis may go away over time without treatment. Your doctor may tell you to: ?Drink more fluids. This will help thin your mucus so it is easier to cough up. ?Use a device that gets medicine into your lungs (inhaler). ?Use a vaporizer or a humidifier. These are machines that add water to the air. This helps with coughing and poor breathing. ?Take a medicine that thins mucus and helps clear it from your lungs. ?Take a medicine that prevents or stops coughing. ?It is not common to take an antibiotic medicine for this condition. ?Follow these instructions at home: ? ?Take over-the-counter and prescription medicines only as told by your doctor. ?Use an inhaler, vaporizer, or humidifier as told by your doctor. ?Take two teaspoons (10  mL) of honey at bedtime. This helps lessen your coughing at night. ?Drink enough fluid to keep your pee (urine) pale yellow. ?Do not smoke or use any products that contain nicotine or tobacco. If you need help quitting, ask your doctor. ?Get a lot of rest. ?Return to your normal activities when your doctor says that it is safe. ?Keep all follow-up visits. ?How is this prevented? ? ?Wash your hands often with soap and water for at least 20 seconds. If you cannot use soap and water, use hand sanitizer. ?Avoid contact with people who have cold symptoms. ?Try not to touch your mouth, nose, or eyes with your hands. ?Avoid breathing in smoke or chemical fumes. ?Make sure to get the flu shot every year. ?Contact a doctor if: ?Your symptoms do not get better in 2 weeks. ?You have trouble coughing up the mucus. ?Your cough keeps you awake at night. ?You have a fever. ?Get help right away if: ?You cough up blood. ?You have chest pain. ?You have very bad shortness of breath. ?You faint or keep feeling like you are going to faint. ?You have a very bad headache. ?Your fever or chills get worse. ?These symptoms may be an emergency. Get help right away. Call your local emergency services (911 in the [place] ?Do not wait to see if the symptoms will go away. ?Do not drive yourself to the hospital. ?Summary ?Acute bronchitis is when air tubes in the lungs (bronchi) suddenly get swollen. In adults, acute bronchitis usually goes away within 2 weeks. ?Drink more fluids. This will help thin your mucus so it is easier to   cough up. ?Take over-the-counter and prescription medicines only as told by your doctor. ?Contact a doctor if your symptoms do not improve after 2 weeks of treatment. ?This information is not intended to replace advice given to you by your health care provider. Make sure you discuss any questions you have with your health care provider. ?Document Revised: 04/10/2021 Document Reviewed: 04/10/2021 ?Elsevier Patient Education  ? 2022 [employer]vier Inc[employer] ? ?

## 2022-01-16 ENCOUNTER — Ambulatory Visit: Payer: 59 | Admitting: Physical Therapy

## 2022-01-21 ENCOUNTER — Ambulatory Visit: Payer: 59 | Admitting: Physical Therapy

## 2022-01-21 ENCOUNTER — Other Ambulatory Visit: Payer: Self-pay

## 2022-01-21 DIAGNOSIS — M25512 Pain in left shoulder: Secondary | ICD-10-CM | POA: Diagnosis not present

## 2022-01-21 DIAGNOSIS — S46011D Strain of muscle(s) and tendon(s) of the rotator cuff of right shoulder, subsequent encounter: Secondary | ICD-10-CM | POA: Diagnosis not present

## 2022-01-21 DIAGNOSIS — M25611 Stiffness of right shoulder, not elsewhere classified: Secondary | ICD-10-CM | POA: Diagnosis not present

## 2022-01-21 DIAGNOSIS — M25511 Pain in right shoulder: Secondary | ICD-10-CM

## 2022-01-21 NOTE — Therapy (Signed)
[place] MAIN [facility] SERVICES [address] [place], [place], [address] Phone: [phone]   Fax:  [phone]  Physical Therapy Treatment/Physical Therapy Progress Note   Dates of reporting period  12/11/21   to   01/21/22   Patient Details  Name: [name] MRN: [id number] Date of Birth: 1961/01/26 No data recorded  Encounter Date: 01/21/2022   PT End of Session - 01/21/22 0855     Visit Number 30    Number of Visits 44    Date for PT Re-Evaluation 02/05/22    Authorization Type last recert/progress note 24/09    Progress Note Due on Visit 30    PT Start Time 0850    PT Stop Time 0930    PT Time Calculation (min) 40 min    Equipment Utilized During Treatment Gait belt    Activity Tolerance Patient tolerated treatment well    Behavior During Therapy WFL for tasks assessed/performed             Past Medical History:  Diagnosis Date   Complication of anesthesia    PONV (postoperative nausea and vomiting)     Past Surgical History:  Procedure Laterality Date   AUGMENTATION MAMMAPLASTY Bilateral 2010   BICEPT TENODESIS Right 09/24/2021   Procedure: BICEPS TENODESIS;  Surgeon: [name], MD;  Location: [place];  Service: Orthopedics;  Laterality: Right;   CERVICAL SPINE SURGERY  2003   C5-C7- double dissectomy   PAROTID GLAND TUMOR EXCISION     SHOULDER ARTHROSCOPY Right 09/24/2021   Procedure: right shoulder arthroscopy, debridement,open rotator cuff tear repair;  Surgeon: [name], MD;  Location: [place];  Service: Orthopedics;  Laterality: Right;   WISDOM TOOTH EXTRACTION Bilateral     There were no vitals filed for this visit.   Subjective Assessment - 01/21/22 0853     Subjective Pt reports some pain in the bicep upon going back to work. Pt reports some disocmfort near repair the night after working but it has been better since. Pt reports shoulder still doing well.    Pertinent History Pt is a 61 y/o  female s/p right shoulder arthroscopy with superior labral debridement, biceps tendon release, subacromial decompression, mini open rotator cuff tear repair and biceps tenodesis - surgery preformed on 09/24/21. She had a follow-up appt with Dr. [name] on 10/26 - sling was removed this date. MD encourages wearing sling at night, otherwise encourages leaving sling off during the day. Shoulder injury occured in March 2022 - she was using a scooter due to an ankle injury. She fell off of the scooter and onto her right shoulder. 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 Of note, pt also reports C5-C7 anterior fusion in 2003.    Limitations Writing;House hold activities;Lifting;Other (comment);Walking;Standing    How long can you sit comfortably? WFL    How long can you stand comfortably? WFL w/ RUE supported    How long can you walk comfortably? 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 No/denies    Pain Onset More than a month ago           Physical therapy treatment session today consisted of completing assessment of goals and administration of testing as demonstrated in flow sheet. Addition treatments may be found  below.   Quick DASH: 27.3   R shoulder PROM: Flex 132 Abd 122 ER 60 IR 70   R shoulder AROM: Flex117 ABD 110    Treatment provided this session   Manual Therapy:  STM to R biceps muscle to trigger points proximally x 5 min PROM and end range holds for 30 sec multiple reps   Therex:  Sci fit level 4 2:30 forward and 2:30 backward -no pain, some fatigue noted. Assistance with positioning of Ues.     Pt educated throughout session about proper posture and technique with exercises. Improved exercise technique, movement at target joints, use of target muscles after min to mod verbal, visual, tactile cues.                        PT Short Term Goals - 01/21/22 1245       PT SHORT TERM GOAL #1   Title Pt will be independent with HEP in order to improve strength and decrease pain in order to improve pain-free function at home and work.    Baseline 11/18: pt reports compliance with HEP, HEP to be further advanced as per protocol, 12/13: still doing regularly, no issues    Time 2    Period Weeks    Status Achieved    Target Date 11/27/21               PT Long Term Goals - 01/21/22 0900       PT LONG TERM GOAL #1   Title Patient will improve right shoulder PROM to > 140 degrees of flexion, scaption, and abduction for improved ability to perform overhead activities.    Baseline 10/18/21: flexion 41*, abd 60*; 11/18: flexoin 96 deg, abduction 81 deg, scaption 104 deg, ER 14 deg (following interventions), 12/13: see flowsheet, 12/21: see flowsheet 01/21/22:  Flex: 132  Abd: 122    Time 8    Period Weeks    Status On-going    Target Date 03/04/22      PT LONG TERM GOAL #2   Title Patient will improve right shoulder external rotation in order to perform hygiene/ADL's such as hair care pain-free.    Baseline 10/18/21: ER 0*; 11/18: ER 14 deg, pain limited, 12/13: see flowsheet, 12/21: see flowsheet 1/31: ER: 60  IR: 70    Time 8    Period Weeks    Status On-going    Target Date 03/04/22      PT LONG TERM GOAL #3   Title Pt will decrease quick DASH score by at least 8% in order to demonstrate clinically significant reduction in disability.    Baseline 10/18/21: 80% 11/18: 70.45%, 12/21: deferred 01/21/22:27.3    Time 8    Period Weeks    Status Achieved    Target Date 02/05/22      PT LONG TERM GOAL #4   Title Pt will decrease worst pain as reported on NPRS by at least 3 points in order to demonstrate clinically significant reduction in pain    Baseline 10/18/21: 10/10 pain; 11/18: 6-7/10, 1/31: 7-8 occassionally    Time 8    Period Weeks    Status Achieved     Target Date 02/05/22      PT LONG TERM GOAL #5   Title Patient will increase FOTO score to equal to or greater than 55 to demonstrate statistically significant improvement in mobility and quality of life.    Baseline 10/18/21: 20%; 11/18: 32%,  12/13: deferred, 12/21: 55%:1/31: 56%    Time 8    Period Weeks    Status Achieved    Target Date 02/05/22      PT LONG TERM GOAL #6   Title Pt will decrease quick DASH score by at least 8% in order to demonstrate clinically significant reduction in disability.    Baseline 11/18: 70%, 12/13: 54.5% 1/31: 27.3%    Time 8    Period Weeks    Status Achieved    Target Date 02/05/22      PT LONG TERM GOAL #7   Title Patient will decrease pain VAS to <3/10 for L shoulder to improve quality of life and allow patient to sleep through the night.    Baseline 12/15: 6/10, 1/31: 8/10 after work, 0/10 resting pain level    Time 8    Period Weeks    Status On-going    Target Date 03/04/22                   Plan - 01/21/22 0855     Clinical Impression Statement Pt presnets for progress note this session following 29 PT visits. Pt has made significant progress with her shoulder motion and funciton. Pt still has some limitations with her ability to actively lift UE in addition to her ability to reach behind her back. Pt also still having significant soreness following her work related activities she started last week. Pt progressing well overall but will continue to benefit from skilled PT intervantion in order to improve her right shoulder strength, range of motion, and overall function. Patient's condition has the potential to improve in response to therapy. Maximum improvement is yet to be obtained. The anticipated improvement is attainable and reasonable in a generally predictable time.      Personal Factors and Comorbidities Age;Time since onset of injury/illness/exacerbation    Examination-Activity Limitations Bathing;Reach  Overhead;Dressing;Hygiene/Grooming;Toileting;Lift;Sleep;Carry    Examination-Participation Restrictions Driving;Cleaning;Meal Prep;Community Activity;Laundry;Occupation;Shop    Stability/Clinical Decision Making Stable/Uncomplicated    Rehab Potential Good    PT Frequency 3x / week    PT Duration 8 weeks    PT Treatment/Interventions ADLs/Self Care Home Management;Aquatic Therapy;Biofeedback;Electrical Stimulation;Cryotherapy;Iontophoresis 4mg /ml Dexamethasone;Moist Heat;Traction;Ultrasound;DME Instruction;Functional mobility training;Therapeutic activities;Therapeutic exercise;Neuromuscular re-education;Patient/family education;Manual techniques;Scar mobilization;Passive range of motion;Dry needling;Other (comment);Joint Manipulations    PT Next Visit Plan progress strengthening of R, pain reduction of L, continue POC as previously indicated    PT Home Exercise Plan RUE AAROM - flexion, abduction, ER (using PVC) - no printout; no updates; 11/18 to be performed LUE only Access Code: [email or web address]; 11/22: green theraputty for R grip strength x2 min; provided pt with blue theraputty for grip strengthening    Consulted and Agree with Plan of Care Patient             Patient will benefit from skilled therapeutic intervention in order to improve the following deficits and impairments:  Decreased activity tolerance, Decreased range of motion, Hypomobility, Impaired perceived functional ability, Impaired UE functional use, Improper body mechanics, Pain, Decreased mobility  Visit Diagnosis: Stiffness of right shoulder, not elsewhere classified  Acute pain of left shoulder  Acute pain of right shoulder  Traumatic complete tear of right rotator cuff, subsequent encounter     Problem List Patient Active Problem List   Diagnosis Date Noted   Cough 01/15/2022   Complete tear of right rotator cuff    Degenerative superior labral anterior-to-posterior (SLAP) tear of right shoulder    Healthcare  maintenance 07/31/2021  Congenital pes cavus 02/25/2021   History of uterine fibroid 01/05/2021   Herpes 07/12/2020   GERD (gastroesophageal reflux disease) 05/23/2020   Vaginal atrophy 05/23/2020   Cervical spine arthritis 05/23/2020   Osteopenia 05/23/2020   Insomnia 05/20/2020   Postmenopausal hormone therapy 05/20/2020    [name], PT 01/21/2022, 12:58 PM  [place] MAIN [facility] SERVICES [address] [place], [place], [address] Phone: [phone]   Fax:  [phone]  Name: [name] MRN: [id number] Date of Birth: 05-Nov-1961

## 2022-01-24 ENCOUNTER — Ambulatory Visit: Payer: 59 | Attending: Orthopedic Surgery

## 2022-01-24 ENCOUNTER — Other Ambulatory Visit: Payer: Self-pay

## 2022-01-24 ENCOUNTER — Other Ambulatory Visit: Payer: Self-pay | Admitting: Orthopedic Surgery

## 2022-01-24 DIAGNOSIS — M25512 Pain in left shoulder: Secondary | ICD-10-CM | POA: Diagnosis not present

## 2022-01-24 DIAGNOSIS — M25511 Pain in right shoulder: Secondary | ICD-10-CM | POA: Diagnosis not present

## 2022-01-24 DIAGNOSIS — M25611 Stiffness of right shoulder, not elsewhere classified: Secondary | ICD-10-CM | POA: Insufficient documentation

## 2022-01-24 DIAGNOSIS — M6281 Muscle weakness (generalized): Secondary | ICD-10-CM | POA: Insufficient documentation

## 2022-01-24 MED FILL — Celecoxib Cap 100 MG: ORAL | 30 days supply | Qty: 60 | Fill #0 | Status: AC

## 2022-01-24 NOTE — Therapy (Signed)
[place] MAIN [facility] SERVICES [address] [place], [place], [address] Phone: [phone]   Fax:  [phone]  Physical Therapy Treatment  Patient Details  Name: [name] MRN: [id number] Date of Birth: 23-Nov-1961 No data recorded  Encounter Date: 01/24/2022    Past Medical History:  Diagnosis Date   Complication of anesthesia    PONV (postoperative nausea and vomiting)     Past Surgical History:  Procedure Laterality Date   AUGMENTATION MAMMAPLASTY Bilateral 2010   BICEPT TENODESIS Right 09/24/2021   Procedure: BICEPS TENODESIS;  Surgeon: [name], MD;  Location: [place];  Service: Orthopedics;  Laterality: Right;   CERVICAL SPINE SURGERY  2003   C5-C7- double dissectomy   PAROTID GLAND TUMOR EXCISION     SHOULDER ARTHROSCOPY Right 09/24/2021   Procedure: right shoulder arthroscopy, debridement,open rotator cuff tear repair;  Surgeon: [name], MD;  Location: [place];  Service: Orthopedics;  Laterality: Right;   WISDOM TOOTH EXTRACTION Bilateral     There were no vitals filed for this visit.  INTERVENTIONS  Manual Therapy: Pt supine on plinth, with towel under R shoulder and bolster under BLES PROM and end range holds for 30 sec x multiple reps for R shoulder flexion, abduction, and ER. All reps performed/modified to tolerance and pain-free range.    Therex:  Sci fit level 4 2:30 forward and 2:30 backward (unbilled); pt rates going forward as more difficult, fatiguing -Set up assistance reports no pain with intervention.   Cone stacking 3 x 4 cones from bottom shelf in linen cabinet to middle shelf in linen cabinet  -continued muscular fatigue with this activity. Pt rates as medium. Requires rest interval between second and third set due to fatigue.  wall push-ups 2x10  Wall slides R shoulder abd/flex 5x each. Abd pain limited, modified to pain-free range.   Pendulums RUE 10x CW/CC  Pball rollouts side to  side 10x, forward/backward 5x to achieve gentle, brief stretch  Elasto gel ice donned while pt performs the following (upon removal no adverse reaction to treatment reported, pt reported improvement with ice):  Supine pec stretch x 60 sec RUE. Pt reports interventions feels good.    Standing 3 way shoulder raises (flex, scapt, ABD) with mirror anteriorly for cues to prevent shoulder hiking and promote equal movement bilaterally  - 10x for each    Pt educated throughout session about proper posture and technique with exercises. Improved exercise technique, movement at target joints, use of target muscles after min to mod verbal, visual, tactile cues.      PT Short Term Goals - 01/21/22 1245       PT SHORT TERM GOAL #1   Title Pt will be independent with HEP in order to improve strength and decrease pain in order to improve pain-free function at home and work.    Baseline 11/18: pt reports compliance with HEP, HEP to be further advanced as per protocol, 12/13: still doing regularly, no issues    Time 2    Period Weeks    Status Achieved    Target Date 11/27/21               PT Long Term Goals - 01/21/22 0900       PT LONG TERM GOAL #1   Title Patient will improve right shoulder PROM to > 140 degrees of flexion, scaption, and abduction for improved ability to perform overhead activities.    Baseline 10/18/21: flexion 41*, abd  60*; 11/18: flexoin 96 deg, abduction 81 deg, scaption 104 deg, ER 14 deg (following interventions), 12/13: see flowsheet, 12/21: see flowsheet 01/21/22:  Flex: 132  Abd: 122    Time 8    Period Weeks    Status On-going    Target Date 03/04/22      PT LONG TERM GOAL #2   Title Patient will improve right shoulder external rotation in order to perform hygiene/ADL's such as hair care pain-free.    Baseline 10/18/21: ER 0*; 11/18: ER 14 deg, pain limited, 12/13: see flowsheet, 12/21: see flowsheet 1/31: ER: 60  IR: 70    Time 8    Period Weeks    Status  On-going    Target Date 03/04/22      PT LONG TERM GOAL #3   Title Pt will decrease quick DASH score by at least 8% in order to demonstrate clinically significant reduction in disability.    Baseline 10/18/21: 80% 11/18: 70.45%, 12/21: deferred 01/21/22:27.3    Time 8    Period Weeks    Status Achieved    Target Date 02/05/22      PT LONG TERM GOAL #4   Title Pt will decrease worst pain as reported on NPRS by at least 3 points in order to demonstrate clinically significant reduction in pain    Baseline 10/18/21: 10/10 pain; 11/18: 6-7/10, 1/31: 7-8 occassionally    Time 8    Period Weeks    Status Achieved    Target Date 02/05/22      PT LONG TERM GOAL #5   Title Patient will increase FOTO score to equal to or greater than 55 to demonstrate statistically significant improvement in mobility and quality of life.    Baseline 10/18/21: 20%; 11/18: 32%, 12/13: deferred, 12/21: 55%:1/31: 56%    Time 8    Period Weeks    Status Achieved    Target Date 02/05/22      PT LONG TERM GOAL #6   Title Pt will decrease quick DASH score by at least 8% in order to demonstrate clinically significant reduction in disability.    Baseline 11/18: 70%, 12/13: 54.5% 1/31: 27.3%    Time 8    Period Weeks    Status Achieved    Target Date 02/05/22      PT LONG TERM GOAL #7   Title Patient will decrease pain VAS to <3/10 for L shoulder to improve quality of life and allow patient to sleep through the night.    Baseline 12/15: 6/10, 1/31: 8/10 after work, 0/10 resting pain level    Time 8    Period Weeks    Status On-going    Target Date 03/04/22                    Patient will benefit from skilled therapeutic intervention in order to improve the following deficits and impairments:     Visit Diagnosis: No diagnosis found.     Problem List Patient Active Problem List   Diagnosis Date Noted   Cough 01/15/2022   Complete tear of right rotator cuff    Degenerative superior labral  anterior-to-posterior (SLAP) tear of right shoulder    Healthcare maintenance 07/31/2021   Congenital pes cavus 02/25/2021   History of uterine fibroid 01/05/2021   Herpes 07/12/2020   GERD (gastroesophageal reflux disease) 05/23/2020   Vaginal atrophy 05/23/2020   Cervical spine arthritis 05/23/2020   Osteopenia 05/23/2020   Insomnia 05/20/2020   Postmenopausal  hormone therapy 05/20/2020    Zollie Pee, PT 01/24/2022, 8:55 AM  [place] MAIN [facility] SERVICES [address] [place], [place], [address] Phone: [phone]   Fax:  [phone]  Name: Lil Lepage MRN: [id number] Date of Birth: 1961/11/09

## 2022-01-28 ENCOUNTER — Ambulatory Visit: Payer: 59

## 2022-01-28 ENCOUNTER — Other Ambulatory Visit: Payer: Self-pay

## 2022-01-30 ENCOUNTER — Ambulatory Visit: Payer: 59

## 2022-02-03 ENCOUNTER — Other Ambulatory Visit: Payer: Self-pay | Admitting: Obstetrics and Gynecology

## 2022-02-03 ENCOUNTER — Other Ambulatory Visit: Payer: Self-pay

## 2022-02-03 ENCOUNTER — Ambulatory Visit: Payer: 59

## 2022-02-03 DIAGNOSIS — M25511 Pain in right shoulder: Secondary | ICD-10-CM | POA: Diagnosis not present

## 2022-02-03 DIAGNOSIS — Z1231 Encounter for screening mammogram for malignant neoplasm of breast: Secondary | ICD-10-CM

## 2022-02-03 DIAGNOSIS — M6281 Muscle weakness (generalized): Secondary | ICD-10-CM

## 2022-02-03 DIAGNOSIS — M25512 Pain in left shoulder: Secondary | ICD-10-CM | POA: Diagnosis not present

## 2022-02-03 DIAGNOSIS — M25611 Stiffness of right shoulder, not elsewhere classified: Secondary | ICD-10-CM

## 2022-02-03 NOTE — Therapy (Signed)
[place] MAIN [facility] SERVICES [address] [place], [place], [address] Phone: [phone]   Fax:  [phone]  Physical Therapy Treatment/Discharge  Patient Details  Name: [name] MRN: [id number] Date of Birth: 1961-08-18 No data recorded  Encounter Date: 02/03/2022   PT End of Session - 02/03/22 0926     Visit Number 32    Number of Visits 44    Date for PT Re-Evaluation 02/05/22    Progress Note Due on Visit 30    PT Start Time 0852    PT Stop Time 0930    PT Time Calculation (min) 38 min    Equipment Utilized During Treatment Gait belt    Activity Tolerance Patient tolerated treatment well    Behavior During Therapy WFL for tasks assessed/performed             Past Medical History:  Diagnosis Date   Complication of anesthesia    PONV (postoperative nausea and vomiting)     Past Surgical History:  Procedure Laterality Date   AUGMENTATION MAMMAPLASTY Bilateral 2010   BICEPT TENODESIS Right 09/24/2021   Procedure: BICEPS TENODESIS;  Surgeon: [name], MD;  Location: [facility];  Service: Orthopedics;  Laterality: Right;   CERVICAL SPINE SURGERY  2003   C5-C7- double dissectomy   PAROTID GLAND TUMOR EXCISION     SHOULDER ARTHROSCOPY Right 09/24/2021   Procedure: right shoulder arthroscopy, debridement,open rotator cuff tear repair;  Surgeon: [name], MD;  Location: [facility];  Service: Orthopedics;  Laterality: Right;   WISDOM TOOTH EXTRACTION Bilateral     There were no vitals filed for this visit.   Subjective Assessment - 02/03/22 0927     Subjective Patient reports she is not limited in her daily life. Has been doing well returning to work. Is ready for today to be her last PT session.    Pertinent History Pt is a 61 y/o female s/p right shoulder arthroscopy with superior labral debridement, biceps tendon release, subacromial decompression, mini open rotator cuff tear repair and biceps tenodesis -  surgery preformed on 09/24/21. She had a follow-up appt with Dr. [name] on 10/26 - sling was removed this date. MD encourages wearing sling at night, otherwise encourages leaving sling off during the day. Shoulder injury occured in March 2022 - she was using a scooter due to an ankle injury. She fell off of the scooter and onto her right shoulder. 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 Of note, pt also reports C5-C7 anterior fusion in 2003.    Limitations Writing;House hold activities;Lifting;Other (comment);Walking;Standing    How long can you sit comfortably? WFL    How long can you stand comfortably? WFL w/ RUE supported    How long can you walk comfortably? 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 No/denies                     Goals performed 01/21/22 FOTO: 60%   Access Code: [email or web address] URL: [email or web address] Date: 02/03/2022 Prepared by: Janna Arch  Exercises Seated Scapular Retraction - 1 x daily - 7 x weekly - 2 sets - 10 reps - 5 hold Standing Shoulder Row with Anchored Resistance - 1 x daily - 7 x weekly - 2 sets - 10  reps - 5 hold Shoulder extension with resistance - Neutral - 1 x daily - 7 x weekly - 2 sets - 10 reps - 5 hold Shoulder External Rotation and Scapular Retraction with Resistance - 1 x daily - 7 x weekly - 2 sets - 10 reps - 5 hold Doorway Pec Stretch at 90 Degrees Abduction - 1 x daily - 7 x weekly - 2 sets - 10 reps - 5 hold Shoulder Internal Rotation with Resistance - 1 x daily - 7 x weekly - 2 sets - 10 reps - 5 hold Standing Bicep Curls with Resistance Band with PLB - 1 x daily - 7 x weekly - 2 sets - 10 reps - 5 hold Wall Push Up with Plus - 1 x daily - 7 x weekly - 2 sets - 10 reps - 5 hold Child's Pose - 1 x daily - 7 x weekly - 2 sets - 10 reps - 5 hold Standing 'L'  Stretch at Counter - 1 x daily - 7 x weekly - 2 sets - 2 reps - 30 hold Bicep Stretch at Table - 1 x daily - 7 x weekly - 2 sets - 2 reps - 30 hold    Pt educated throughout session about proper posture and technique with exercises. Improved exercise technique, movement at target joints, use of target muscles after min to mod verbal, visual, tactile cues.    Patient reports she is able to perform all tasks now and has returned to work. Is ready for discharge with a comprehensive home program. Education and performance of HEP performed. Will be happy to see patient again in the future as needed.                  PT Education - 02/03/22 0926     Education Details discharge    Person(s) Educated Patient    Methods Explanation;Demonstration;Tactile cues;Verbal cues    Comprehension Verbalized understanding;Returned demonstration;Verbal cues required;Tactile cues required              PT Short Term Goals - 01/21/22 1245       PT SHORT TERM GOAL #1   Title Pt will be independent with HEP in order to improve strength and decrease pain in order to improve pain-free function at home and work.    Baseline 11/18: pt reports compliance with HEP, HEP to be further advanced as per protocol, 12/13: still doing regularly, no issues    Time 2    Period Weeks    Status Achieved    Target Date 11/27/21               PT Long Term Goals - 02/03/22 1235       PT LONG TERM GOAL #1   Title Patient will improve right shoulder PROM to > 140 degrees of flexion, scaption, and abduction for improved ability to perform overhead activities.    Baseline 10/18/21: flexion 41*, abd 60*; 11/18: flexoin 96 deg, abduction 81 deg, scaption 104 deg, ER 14 deg (following interventions), 12/13: see flowsheet, 12/21: see flowsheet 01/21/22:  Flex: 132  Abd: 122    Time 8    Period Weeks    Status On-going    Target Date 03/04/22      PT LONG TERM GOAL #2   Title Patient will improve right  shoulder external rotation in order to perform hygiene/ADL's such as hair care pain-free.    Baseline 10/18/21: ER 0*; 11/18: ER 14 deg, pain  limited, 12/13: see flowsheet, 12/21: see flowsheet 1/31: ER: 60  IR: 70    Time 8    Period Weeks    Status On-going    Target Date 03/04/22      PT LONG TERM GOAL #3   Title Pt will decrease quick DASH score by at least 8% in order to demonstrate clinically significant reduction in disability.    Baseline 10/18/21: 80% 11/18: 70.45%, 12/21: deferred 01/21/22:27.3    Time 8    Period Weeks    Status Achieved    Target Date 02/05/22      PT LONG TERM GOAL #4   Title Pt will decrease worst pain as reported on NPRS by at least 3 points in order to demonstrate clinically significant reduction in pain    Baseline 10/18/21: 10/10 pain; 11/18: 6-7/10, 1/31: 7-8 occassionally    Time 8    Period Weeks    Status Achieved    Target Date 02/05/22      PT LONG TERM GOAL #5   Title Patient will increase FOTO score to equal to or greater than 55 to demonstrate statistically significant improvement in mobility and quality of life.    Baseline 10/18/21: 20%; 11/18: 32%, 12/13: deferred, 12/21: 55%:1/31: 56% 2/13: 60%    Time 8    Period Weeks    Status Achieved    Target Date 02/05/22      PT LONG TERM GOAL #6   Title Pt will decrease quick DASH score by at least 8% in order to demonstrate clinically significant reduction in disability.    Baseline 11/18: 70%, 12/13: 54.5% 1/31: 27.3%    Time 8    Period Weeks    Status Achieved    Target Date 02/05/22      PT LONG TERM GOAL #7   Title Patient will decrease pain VAS to <3/10 for L shoulder to improve quality of life and allow patient to sleep through the night.    Baseline 12/15: 6/10, 1/31: 8/10 after work, 0/10 resting pain level    Time 8    Period Weeks    Status On-going    Target Date 03/04/22                   Plan - 02/03/22 1231     Clinical Impression Statement Patient  reports she is able to perform all tasks now and has returned to work. Is ready for discharge with a comprehensive home program. Education and performance of HEP performed. Will be happy to see patient again in the future as needed.    Personal Factors and Comorbidities Age;Time since onset of injury/illness/exacerbation    Examination-Activity Limitations Bathing;Reach Overhead;Dressing;Hygiene/Grooming;Toileting;Lift;Sleep;Carry    Examination-Participation Restrictions Driving;Cleaning;Meal Prep;Community Activity;Laundry;Occupation;Shop    Stability/Clinical Decision Making Stable/Uncomplicated    Rehab Potential Good    PT Frequency 3x / week    PT Duration 8 weeks    PT Treatment/Interventions ADLs/Self Care Home Management;Aquatic Therapy;Biofeedback;Electrical Stimulation;Cryotherapy;Iontophoresis 4mg /ml Dexamethasone;Moist Heat;Traction;Ultrasound;DME Instruction;Functional mobility training;Therapeutic activities;Therapeutic exercise;Neuromuscular re-education;Patient/family education;Manual techniques;Scar mobilization;Passive range of motion;Dry needling;Other (comment);Joint Manipulations    PT Next Visit Plan progress strengthening of R, pain reduction of L, continue POC as previously indicated    PT Home Exercise Plan RUE AAROM - flexion, abduction, ER (using PVC) - no printout; no updates; 11/18 to be performed LUE only Access Code: [email or web address]; 11/22: green theraputty for R grip strength x2 min; provided pt with blue theraputty for grip strengthening; no updates  Consulted and Agree with Plan of Care Patient             Patient will benefit from skilled therapeutic intervention in order to improve the following deficits and impairments:  Decreased activity tolerance, Decreased range of motion, Hypomobility, Impaired perceived functional ability, Impaired UE functional use, Improper body mechanics, Pain, Decreased mobility  Visit Diagnosis: Muscle weakness  (generalized)  Stiffness of right shoulder, not elsewhere classified  Acute pain of left shoulder  Acute pain of right shoulder     Problem List Patient Active Problem List   Diagnosis Date Noted   Cough 01/15/2022   Complete tear of right rotator cuff    Degenerative superior labral anterior-to-posterior (SLAP) tear of right shoulder    Healthcare maintenance 07/31/2021   Congenital pes cavus 02/25/2021   History of uterine fibroid 01/05/2021   Herpes 07/12/2020   GERD (gastroesophageal reflux disease) 05/23/2020   Vaginal atrophy 05/23/2020   Cervical spine arthritis 05/23/2020   Osteopenia 05/23/2020   Insomnia 05/20/2020   Postmenopausal hormone therapy 05/20/2020    Janna Arch, PT, DPT  02/03/2022, 12:36 PM  [place] MAIN [facility] SERVICES [address] [place], [place], [address] Phone: [phone]   Fax:  [phone]  Name: [name] MRN: [id number] Date of Birth: 03-Aug-1961

## 2022-02-04 DIAGNOSIS — F520 Hypoactive sexual desire disorder: Secondary | ICD-10-CM | POA: Diagnosis not present

## 2022-02-04 DIAGNOSIS — Z79890 Hormone replacement therapy (postmenopausal): Secondary | ICD-10-CM | POA: Diagnosis not present

## 2022-02-13 ENCOUNTER — Ambulatory Visit: Payer: 59

## 2022-02-18 ENCOUNTER — Ambulatory Visit: Payer: 59 | Admitting: Dermatology

## 2022-02-18 ENCOUNTER — Other Ambulatory Visit: Payer: Self-pay

## 2022-02-18 ENCOUNTER — Encounter: Payer: Self-pay | Admitting: Dermatology

## 2022-02-18 DIAGNOSIS — L609 Nail disorder, unspecified: Secondary | ICD-10-CM | POA: Diagnosis not present

## 2022-02-18 DIAGNOSIS — Z872 Personal history of diseases of the skin and subcutaneous tissue: Secondary | ICD-10-CM | POA: Diagnosis not present

## 2022-02-18 DIAGNOSIS — L578 Other skin changes due to chronic exposure to nonionizing radiation: Secondary | ICD-10-CM | POA: Diagnosis not present

## 2022-02-18 NOTE — Patient Instructions (Signed)
Recommend taking Heliocare sun protection supplement daily in sunny weather for additional sun protection. For maximum protection on the sunniest days, you can take up to 2 capsules of regular Heliocare OR take 1 capsule of Heliocare Ultra. For prolonged exposure (such as a full day in the sun), you can repeat your dose of the supplement 4 hours after your first dose. Heliocare can be purchased at [employer], at some [facility] or at [email or web address].    Recommend daily broad spectrum sunscreen SPF 30+ to sun-exposed areas, reapply every 2 hours as needed. Call for new or changing lesions.  Staying in the shade or wearing long sleeves, sun glasses (UVA+UVB protection) and wide brim hats (4-inch brim around the entire circumference of the hat) are also recommended for sun protection.   If You Need Anything After Your Visit  If you have any questions or concerns for your doctor, please call our main line at [phone] and press option 4 to reach your doctor's medical assistant. If no one answers, please leave a voicemail as directed and we will return your call as soon as possible. Messages left after 4 pm will be answered the following business day.   You may also send [place] a message via [place]. We typically respond to MyChart messages within 1-2 business days.  For prescription refills, please ask your pharmacy to contact our office. Our fax number is [phone].  If you have an urgent issue when the clinic is closed that cannot wait until the next business day, you can page your doctor at the number below.    Please note that while we do our best to be available for urgent issues outside of office hours, we are not available 24/7.   If you have an urgent issue and are unable to reach [place], you may choose to seek medical care at your doctor's office, retail clinic, urgent care center, or emergency room.  If you have a medical emergency, please immediately call 911 or go to the emergency  department.  Pager Numbers  - Dr. Nehemiah Massed: [phone]  - Dr. Laurence Ferrari: [phone]  - Dr. Nicole Kindred: [phone]  In the event of inclement weather, please call our main line at [phone] for an update on the status of any delays or closures.  Dermatology Medication Tips: Please keep the boxes that topical medications come in in order to help keep track of the instructions about where and how to use these. Pharmacies typically print the medication instructions only on the boxes and not directly on the medication tubes.   If your medication is too expensive, please contact our office at [phone] option 4 or send [place] a message through [place].   We are unable to tell what your co-pay for medications will be in advance as this is different depending on your insurance coverage. However, we may be able to find a substitute medication at lower cost or fill out paperwork to get insurance to cover a needed medication.   If a prior authorization is required to get your medication covered by your insurance company, please allow [place] 1-2 business days to complete this process.  Drug prices often vary depending on where the prescription is filled and some pharmacies may offer cheaper prices.  The website www.goodrx.com contains coupons for medications through different pharmacies. The prices here do not account for what the cost may be with help from insurance (it may be cheaper with your insurance), but the website can give you the price if you did  not use any insurance.  - You can print the associated coupon and take it with your prescription to the pharmacy.  - You may also stop by our office during regular business hours and pick up a GoodRx coupon card.  - If you need your prescription sent electronically to a different pharmacy, notify our office through [facility] or by phone at [phone] option 4.     Si Usted Necesita Algo Despus de Su Visita  Tambin puede enviarnos un  mensaje a travs de Pharmacist, community. Por lo general respondemos a los mensajes de MyChart en el transcurso de 1 a 2 das hbiles.  Para renovar recetas, por favor pida a su farmacia que se ponga en contacto con nuestra oficina. Harland Dingwall de fax es [place] [phone].  Si tiene un asunto urgente cuando la clnica est cerrada y que no puede esperar hasta el siguiente da hbil, puede llamar/localizar a su doctor(a) al nmero que aparece a continuacin.   Por favor, tenga en cuenta que aunque hacemos todo lo posible para estar disponibles para asuntos urgentes fuera del horario de [place], no estamos disponibles las 24 horas del da, los 7 das de la [place].   Si tiene un problema urgente y no puede comunicarse con nosotros, puede optar por buscar atencin mdica  en el consultorio de su doctor(a), en una clnica privada, en un centro de atencin urgente o en una sala de emergencias.  Si tiene Engineering geologist, por favor llame inmediatamente al 911 o vaya a la sala de emergencias.  Nmeros de bper  - Dr. Nehemiah Massed: [phone]  - Dra. Moye: [phone]  - Dra. Nicole Kindred: [phone]  En caso de inclemencias del [place], por favor llame a Johnsie Kindred principal al [phone] para una actualizacin sobre el [place] de cualquier retraso o cierre.  Consejos para la medicacin en dermatologa: Por favor, guarde las cajas en las que vienen los medicamentos de uso tpico para ayudarle a seguir las instrucciones sobre dnde y cmo usarlos. Las farmacias generalmente imprimen las instrucciones del medicamento slo en las cajas y no directamente en los tubos del [place].   Si su medicamento es muy caro, por favor, pngase en contacto con Zigmund Daniel llamando al [phone] y presione la opcin 4 o envenos un mensaje a travs de Pharmacist, community.   No podemos decirle cul ser su copago por los medicamentos por adelantado ya que esto es diferente dependiendo de la cobertura de su seguro. Sin embargo,  es posible que podamos encontrar un medicamento sustituto a Electrical engineer un formulario para que el seguro cubra el medicamento que se considera necesario.   Si se requiere una autorizacin previa para que su compaa de seguros [place] su medicamento, por favor permtanos de 1 a 2 das hbiles para completar este proceso.  Los precios de los medicamentos varan con frecuencia dependiendo del Environmental consultant de dnde se surte la receta y alguna farmacias pueden ofrecer precios ms baratos.  El sitio web www.goodrx.com tiene cupones para medicamentos de Airline pilot. Los precios aqu no tienen en cuenta lo que podra costar con la ayuda del seguro (puede ser ms barato con su seguro), pero el sitio web puede darle el precio si no utiliz Research scientist (physical sciences).  - Puede imprimir el cupn correspondiente y llevarlo con su receta a la farmacia.  - Tambin puede pasar por nuestra oficina durante el horario de atencin regular y Charity fundraiser una tarjeta de cupones de GoodRx.  - Si necesita que su receta se enve  electrnicamente a [place] diferente, informe a nuestra oficina a travs de MyChart de [place] o por telfono llamando al [phone] y presione la opcin 4.

## 2022-02-18 NOTE — Progress Notes (Signed)
° °  Follow-Up Visit   Subjective  [name] is a 61 y.o. female who presents for the following: Follow-up (Patient here today for AK follow up at the chest. Patient treated with 5FU/calcipotriene mid December and had good results. Patient sent pictures via MyChart showing results. ).  The following portions of the chart were reviewed this encounter and updated as appropriate:   Tobacco   Allergies   Meds   Problems   Med Hx   Surg Hx   Fam Hx       Review of Systems:  No other skin or systemic complaints except as noted in HPI or Assessment and Plan.  Objective  Well appearing patient in no apparent distress; mood and affect are within normal limits.  A focused examination was performed including chest. Relevant physical exam findings are noted in the Assessment and Plan.  fingernails Slight transverse ridges at multiple nail plates    Assessment & Plan  Nail problem fingernails  Possibly secondary to manicure or recent surgery. Benign-appearing and will grow out with time.   History of PreCancerous Actinic Keratosis  - site(s) of PreCancerous Actinic Keratosis clear today. - these may recur and new lesions may form requiring treatment to prevent transformation into skin cancer - observe for new or changing spots and contact [place] for appointment if occur - photoprotection with sun protective clothing; sunglasses and broad spectrum sunscreen with SPF of at least 30 + and frequent self skin exams recommended - yearly exams by a dermatologist recommended for persons with history of PreCancerous Actinic Keratoses  Actinic Damage - chronic, secondary to cumulative UV radiation exposure/sun exposure over time - diffuse scaly erythematous macules with underlying dyspigmentation - Recommend daily broad spectrum sunscreen SPF 30+ to sun-exposed areas, reapply every 2 hours as needed.  - Recommend staying in the shade or wearing long sleeves, sun glasses (UVA+UVB  protection) and wide brim hats (4-inch brim around the entire circumference of the hat). - Call for new or changing lesions. - Recommend taking Heliocare sun protection supplement daily in sunny weather for additional sun protection. For maximum protection on the sunniest days, you can take up to 2 capsules of regular Heliocare OR take 1 capsule of Heliocare Ultra. For prolonged exposure (such as a full day in the sun), you can repeat your dose of the supplement 4 hours after your first dose.   Return in about 1 month (around 03/18/2022) for TBSE.  Graciella Belton, RMA, am acting as scribe for Forest Gleason, MD .  Documentation: I have reviewed the above documentation for accuracy and completeness, and I agree with the above.  Forest Gleason, MD

## 2022-03-20 ENCOUNTER — Ambulatory Visit: Payer: Self-pay | Admitting: Dermatology

## 2022-04-11 ENCOUNTER — Ambulatory Visit
Admission: RE | Admit: 2022-04-11 | Discharge: 2022-04-11 | Disposition: A | Discharge status: Home / Self Care | Payer: BC Managed Care – PPO | Source: Ambulatory Visit | Attending: Obstetrics and Gynecology | Admitting: Obstetrics and Gynecology

## 2022-04-11 DIAGNOSIS — Z1231 Encounter for screening mammogram for malignant neoplasm of breast: Secondary | ICD-10-CM | POA: Diagnosis not present

## 2022-04-11 IMAGING — MG DIGITAL SCREENING BREAST BILAT IMPLANT W/ TOMO W/ CAD
8 of 16 series · 8 of 40 positions shown · non-contrast
Comparison: Previous exam(s).

CLINICAL DATA: Screening.

EXAM:
DIGITAL SCREENING BILATERAL MAMMOGRAM WITH IMPLANTS, CAD AND
TOMOSYNTHESIS
TECHNIQUE: Bilateral screening digital craniocaudal and mediolateral oblique
mammograms were obtained. Bilateral screening digital breast
tomosynthesis was performed. The images were evaluated with
computer-aided detection. Standard and/or implant displaced views
were performed.

[L CC]
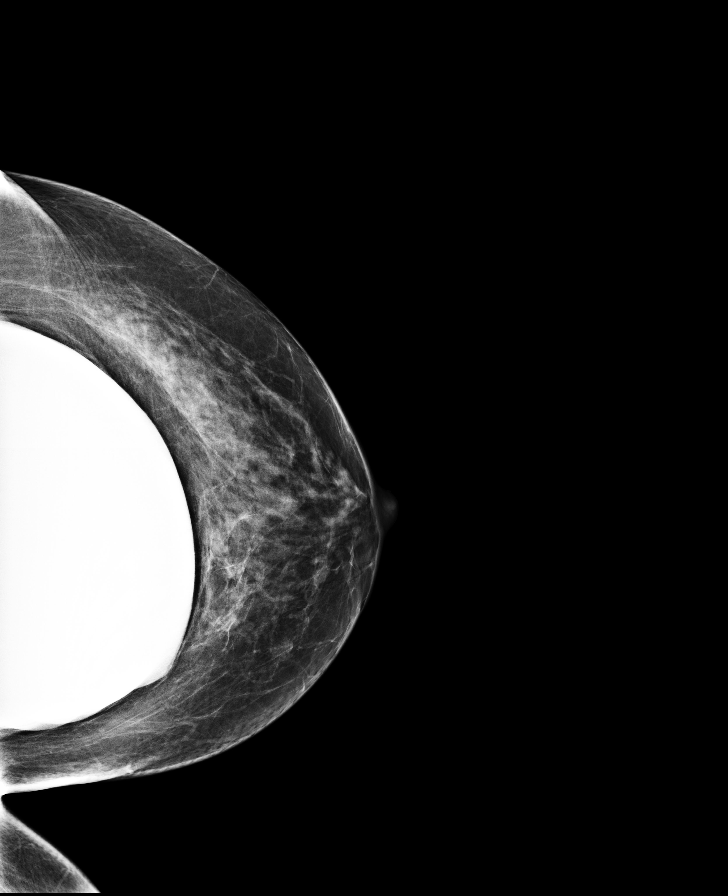

[R CC]
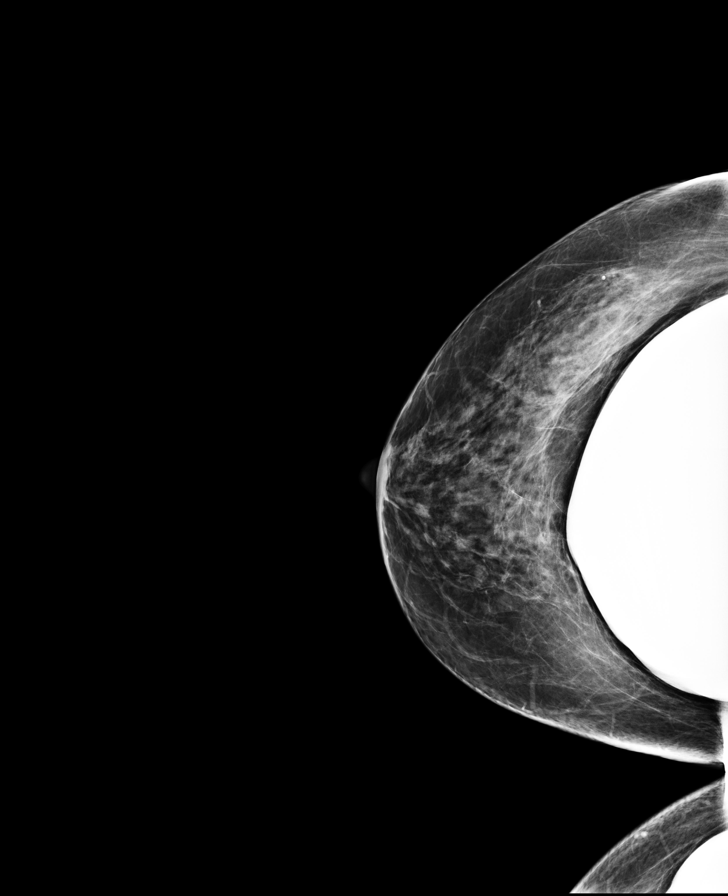

[R MLO]
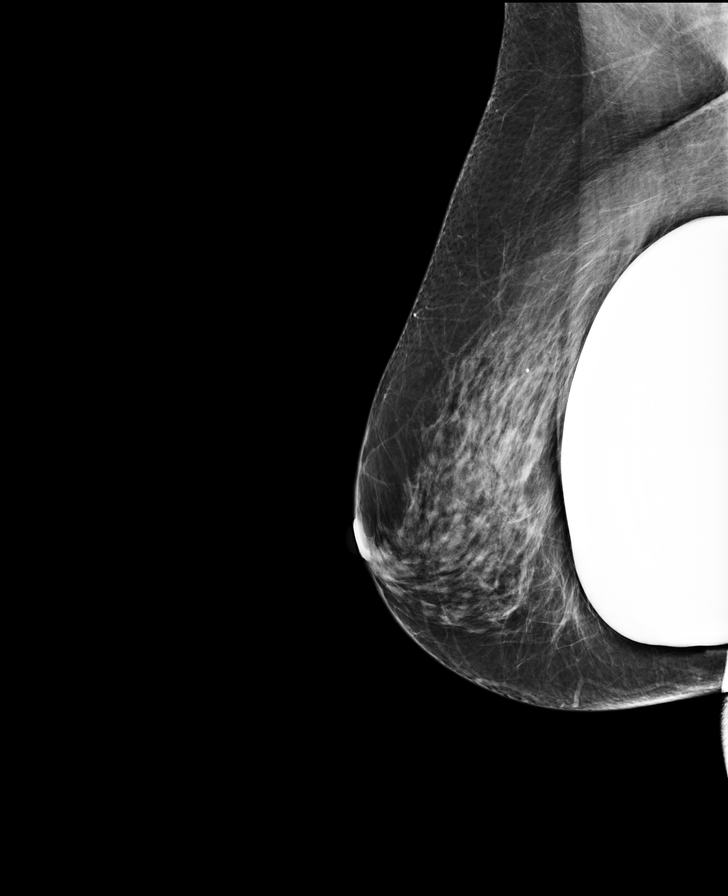

[L MLO]
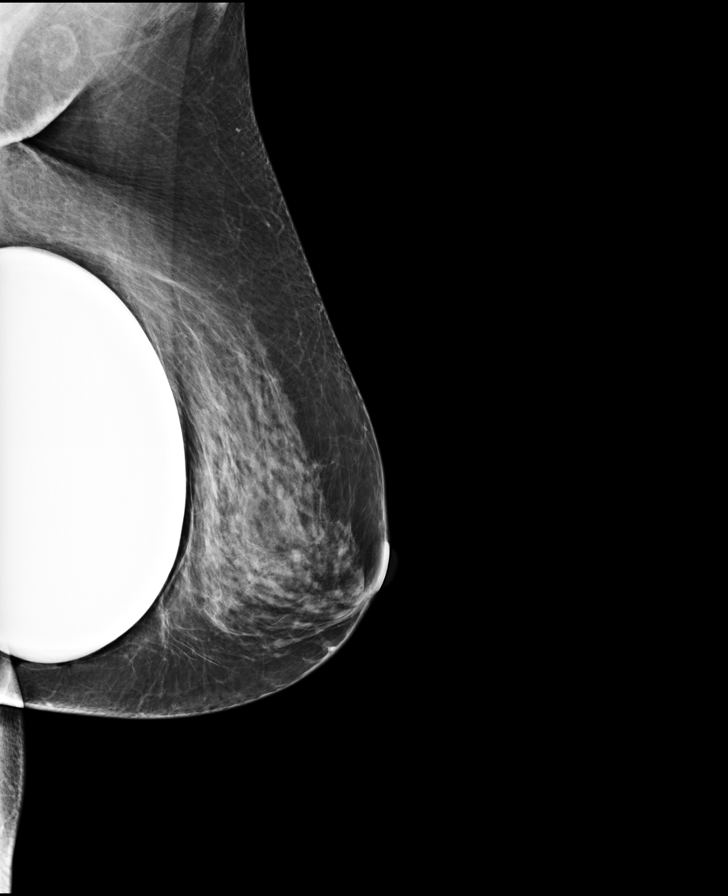

[R MLO synth-2D (1 of 2)]
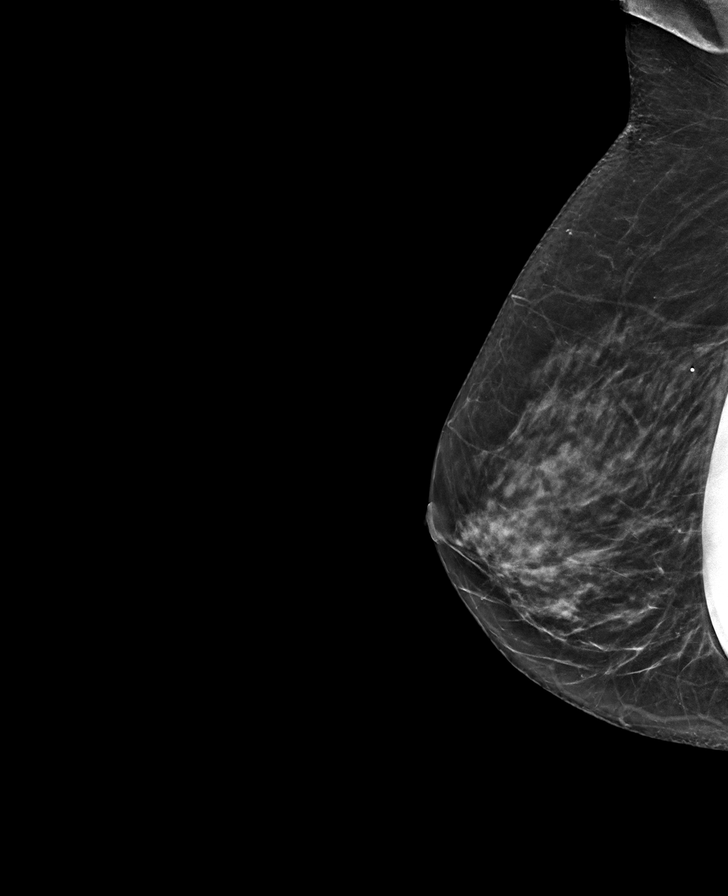

[L MLO synth-2D (1 of 2)]
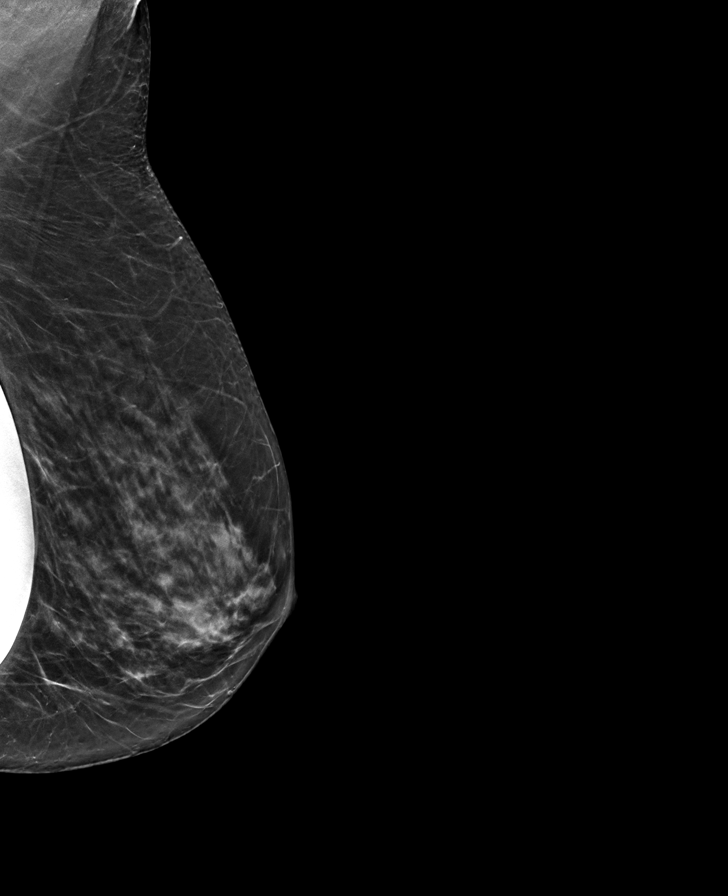

[R MLO synth-2D (2 of 2)]
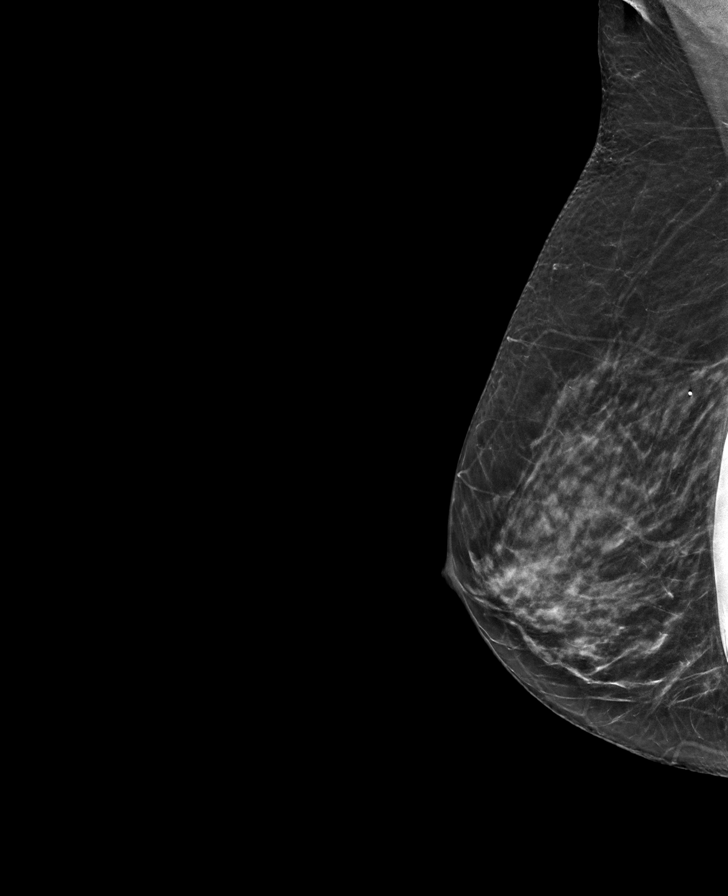

[L MLO synth-2D (2 of 2)]
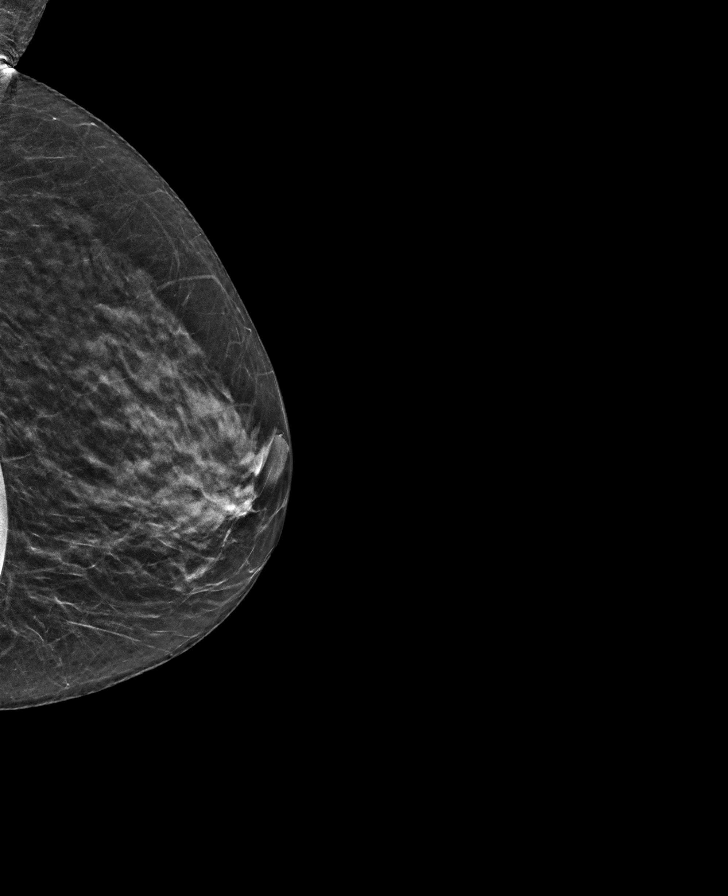

[8 of 40 positions shown; findings below may reference images not displayed]

ACR Breast Density Category c: The breast tissue is heterogeneously
dense, which may obscure small masses.
FINDINGS: The patient has retropectoral implants. There are no findings
suspicious for malignancy.
IMPRESSION: No mammographic evidence of malignancy. A result letter of this
screening mammogram will be mailed directly to the patient.

RECOMMENDATION:
Screening mammogram in one year. (Code:[LZ])

BI-RADS CATEGORY  1:  Negative.

## 2022-04-14 DIAGNOSIS — F520 Hypoactive sexual desire disorder: Secondary | ICD-10-CM | POA: Diagnosis not present

## 2022-04-14 DIAGNOSIS — Z79890 Hormone replacement therapy (postmenopausal): Secondary | ICD-10-CM | POA: Diagnosis not present

## 2022-05-23 DIAGNOSIS — Z79890 Hormone replacement therapy (postmenopausal): Secondary | ICD-10-CM | POA: Diagnosis not present

## 2022-05-23 DIAGNOSIS — F520 Hypoactive sexual desire disorder: Secondary | ICD-10-CM | POA: Diagnosis not present

## 2022-07-08 ENCOUNTER — Encounter: Payer: Self-pay | Admitting: Dermatology

## 2022-07-08 ENCOUNTER — Ambulatory Visit (INDEPENDENT_AMBULATORY_CARE_PROVIDER_SITE_OTHER): Payer: BC Managed Care – PPO | Admitting: Dermatology

## 2022-07-08 DIAGNOSIS — R238 Other skin changes: Secondary | ICD-10-CM

## 2022-07-08 DIAGNOSIS — L821 Other seborrheic keratosis: Secondary | ICD-10-CM

## 2022-07-08 DIAGNOSIS — L905 Scar conditions and fibrosis of skin: Secondary | ICD-10-CM | POA: Diagnosis not present

## 2022-07-08 NOTE — Patient Instructions (Addendum)
Recommend daily broad spectrum sunscreen SPF 30+ to sun-exposed areas, reapply every 2 hours as needed. Call for new or changing lesions.  Staying in the shade or wearing long sleeves, sun glasses (UVA+UVB protection) and wide brim hats (4-inch brim around the entire circumference of the hat) are also recommended for sun protection.    Recommend taking Heliocare sun protection supplement daily in sunny weather for additional sun protection. For maximum protection on the sunniest days, you can take up to 2 capsules of regular Heliocare OR take 1 capsule of Heliocare Ultra. For prolonged exposure (such as a full day in the sun), you can repeat your dose of the supplement 4 hours after your first dose. Heliocare can be purchased at [employer], at some [facility] or at [email or web address].     Seborrheic Keratosis  What causes seborrheic keratoses? Seborrheic keratoses are harmless, common skin growths that first appear during adult life.  As time goes by, more growths appear.  Some people may develop a large number of them.  Seborrheic keratoses appear on both covered and uncovered body parts.  They are not caused by sunlight.  The tendency to develop seborrheic keratoses can be inherited.  They vary in color from skin-colored to gray, brown, or even black.  They can be either smooth or have a rough, warty surface.   Seborrheic keratoses are superficial and look as if they were stuck on the skin.  Under the microscope this type of keratosis looks like layers upon layers of skin.  That is why at times the top layer may seem to fall off, but the rest of the growth remains and re-grows.    Treatment Seborrheic keratoses do not need to be treated, but can easily be removed in the office.  Seborrheic keratoses often cause symptoms when they rub on clothing or jewelry.  Lesions can be in the way of shaving.  If they become inflamed, they can cause itching, soreness, or burning.  Removal of a seborrheic  keratosis can be accomplished by freezing, burning, or surgery. If any spot bleeds, scabs, or grows rapidly, please return to have it checked, as these can be an indication of a skin cancer.   Due to recent changes in healthcare laws, you may see results of your pathology and/or laboratory studies on MyChart before the doctors have had a chance to review them. We understand that in some cases there may be results that are confusing or concerning to you. Please understand that not all results are received at the same time and often the doctors may need to interpret multiple results in order to provide you with the best plan of care or course of treatment. Therefore, we ask that you please give [place] 2 business days to thoroughly review all your results before contacting the office for clarification. Should we see a critical lab result, you will be contacted sooner.   If You Need Anything After Your Visit  If you have any questions or concerns for your doctor, please call our main line at [phone] and press option 4 to reach your doctor's medical assistant. If no one answers, please leave a voicemail as directed and we will return your call as soon as possible. Messages left after 4 pm will be answered the following business day.   You may also send [place] a message via [place]. We typically respond to MyChart messages within 1-2 business days.  For prescription refills, please ask your pharmacy to contact our office. Our fax  number is [phone].  If you have an urgent issue when the clinic is closed that cannot wait until the next business day, you can page your doctor at the number below.    Please note that while we do our best to be available for urgent issues outside of office hours, we are not available 24/7.   If you have an urgent issue and are unable to reach [place], you may choose to seek medical care at your doctor's office, retail clinic, urgent care center, or emergency room.  If you have  a medical emergency, please immediately call 911 or go to the emergency department.  Pager Numbers  - Dr. Nehemiah Massed: [phone]  - Dr. Laurence Ferrari: [phone]  - Dr. Nicole Kindred: [phone]  In the event of inclement weather, please call our main line at [phone] for an update on the status of any delays or closures.  Dermatology Medication Tips: Please keep the boxes that topical medications come in in order to help keep track of the instructions about where and how to use these. Pharmacies typically print the medication instructions only on the boxes and not directly on the medication tubes.   If your medication is too expensive, please contact our office at [phone] option 4 or send [place] a message through [place].   We are unable to tell what your co-pay for medications will be in advance as this is different depending on your insurance coverage. However, we may be able to find a substitute medication at lower cost or fill out paperwork to get insurance to cover a needed medication.   If a prior authorization is required to get your medication covered by your insurance company, please allow [place] 1-2 business days to complete this process.  Drug prices often vary depending on where the prescription is filled and some pharmacies may offer cheaper prices.  The website www.goodrx.com contains coupons for medications through different pharmacies. The prices here do not account for what the cost may be with help from insurance (it may be cheaper with your insurance), but the website can give you the price if you did not use any insurance.  - You can print the associated coupon and take it with your prescription to the pharmacy.  - You may also stop by our office during regular business hours and pick up a GoodRx coupon card.  - If you need your prescription sent electronically to a different pharmacy, notify our office through [facility] or by phone at [phone] option 4.     Si  Usted Necesita Algo Despus de Su Visita  Tambin puede enviarnos un mensaje a travs de Pharmacist, community. Por lo general respondemos a los mensajes de MyChart en el transcurso de 1 a 2 das hbiles.  Para renovar recetas, por favor pida a su farmacia que se ponga en contacto con nuestra oficina. Harland Dingwall de fax es [place] [phone].  Si tiene un asunto urgente cuando la clnica est cerrada y que no puede esperar hasta el siguiente da hbil, puede llamar/localizar a su doctor(a) al nmero que aparece a continuacin.   Por favor, tenga en cuenta que aunque hacemos todo lo posible para estar disponibles para asuntos urgentes fuera del horario de [place], no estamos disponibles las 24 horas del da, los 7 das de la [place].   Si tiene un problema urgente y no puede comunicarse con nosotros, puede optar por buscar atencin mdica  en el consultorio de su doctor(a), en una clnica privada, en un centro de  atencin urgente o en una sala de emergencias.  Si tiene Engineering geologist, por favor llame inmediatamente al 911 o vaya a la sala de emergencias.  Nmeros de bper  - Dr. Nehemiah Massed: [phone]  - Dra. Moye: [phone]  - Dra. Nicole Kindred: [phone]  En caso de inclemencias del [place], por favor llame a Johnsie Kindred principal al [phone] para una actualizacin sobre el [place] de cualquier retraso o cierre.  Consejos para la medicacin en dermatologa: Por favor, guarde las cajas en las que vienen los medicamentos de uso tpico para ayudarle a seguir las instrucciones sobre dnde y cmo usarlos. Las farmacias generalmente imprimen las instrucciones del medicamento slo en las cajas y no directamente en los tubos del [place].   Si su medicamento es muy caro, por favor, pngase en contacto con Zigmund Daniel llamando al [phone] y presione la opcin 4 o envenos un mensaje a travs de Pharmacist, community.   No podemos decirle cul ser su copago por los medicamentos por adelantado ya que  esto es diferente dependiendo de la cobertura de su seguro. Sin embargo, es posible que podamos encontrar un medicamento sustituto a Electrical engineer un formulario para que el seguro cubra el medicamento que se considera necesario.   Si se requiere una autorizacin previa para que su compaa de seguros [place] su medicamento, por favor permtanos de 1 a 2 das hbiles para completar este proceso.  Los precios de los medicamentos varan con frecuencia dependiendo del Environmental consultant de dnde se surte la receta y alguna farmacias pueden ofrecer precios ms baratos.  El sitio web www.goodrx.com tiene cupones para medicamentos de Airline pilot. Los precios aqu no tienen en cuenta lo que podra costar con la ayuda del seguro (puede ser ms barato con su seguro), pero el sitio web puede darle el precio si no utiliz Research scientist (physical sciences).  - Puede imprimir el cupn correspondiente y llevarlo con su receta a la farmacia.  - Tambin puede pasar por nuestra oficina durante el horario de atencin regular y Charity fundraiser una tarjeta de cupones de GoodRx.  - Si necesita que su receta se enve electrnicamente a una farmacia diferente, informe a nuestra oficina a travs de MyChart de [place] o por telfono llamando al [phone] y presione la opcin 4.

## 2022-07-08 NOTE — Progress Notes (Signed)
   Follow-Up Visit   Subjective  [name] is a 61 y.o. female who presents for the following: lesions (Right chest and face. Has H/O Aks she treated with 5FU/Calcipotriene cream. Thinks areas have come back. Raised, rough).  The patient has spots, moles and lesions to be evaluated, some may be new or changing and the patient has concerns that these could be cancer.   The following portions of the chart were reviewed this encounter and updated as appropriate:  Tobacco  Allergies  Meds  Problems  Med Hx  Surg Hx  Fam Hx      Review of Systems: No other skin or systemic complaints except as noted in HPI or Assessment and Plan.   Objective  Well appearing patient in no apparent distress; mood and affect are within normal limits.  A focused examination was performed including face, neck, chest. Relevant physical exam findings are noted in the Assessment and Plan.  Right Cheek, right dorsal hand Thin tan papule  Right Lateral cheek 0.5 cm small depressed white scar without features suspicious for malignancy on dermoscopy      Right Postauricular Area Excoriated inflammatory papule   Assessment & Plan   Seborrheic Keratoses. Chest.  - Stuck-on, waxy, tan-brown papules and/or plaques  - Benign-appearing - Discussed benign etiology and prognosis. - Observe - Call for any changes  Actinic Damage. Face, chest. - chronic, secondary to cumulative UV radiation exposure/sun exposure over time - diffuse scaly erythematous macules with underlying dyspigmentation - Recommend daily broad spectrum sunscreen SPF 30+ to sun-exposed areas, reapply every 2 hours as needed.  - Recommend staying in the shade or wearing long sleeves, sun glasses (UVA+UVB protection) and wide brim hats (4-inch brim around the entire circumference of the hat). - Call for new or changing lesions.   Seborrheic keratosis Right Cheek, right dorsal hand  Reassured benign age-related growth.  Recommend  observation.  Discussed cryotherapy if spot(s) become irritated or inflamed.  Scar Right Lateral cheek  Favor scar >> BCC   Recheck at next visit for TBSE.  Pt to call for any changes.  Excoriated papule Right Postauricular Area  Benign-appearing.  Observation.  Call clinic for new or changing lesions.     Return for TBSE As Scheduled.  I, Emelia Salisbury, CMA, am acting as scribe for Forest Gleason, MD.  Documentation: I have reviewed the above documentation for accuracy and completeness, and I agree with the above.  Forest Gleason, MD

## 2022-07-13 ENCOUNTER — Encounter: Payer: Self-pay | Admitting: Dermatology

## 2022-07-27 NOTE — Patient Instructions (Signed)

## 2022-08-01 ENCOUNTER — Ambulatory Visit (INDEPENDENT_AMBULATORY_CARE_PROVIDER_SITE_OTHER): Payer: BC Managed Care – PPO | Admitting: Nurse Practitioner

## 2022-08-01 ENCOUNTER — Encounter: Payer: Self-pay | Admitting: Nurse Practitioner

## 2022-08-01 VITALS — BP 90/62 | HR 79 | Temp 98.3°F | Ht 63.0 in | Wt 148.6 lb

## 2022-08-01 DIAGNOSIS — Z13220 Encounter for screening for lipoid disorders: Secondary | ICD-10-CM | POA: Diagnosis not present

## 2022-08-01 DIAGNOSIS — F5101 Primary insomnia: Secondary | ICD-10-CM

## 2022-08-01 DIAGNOSIS — Z79890 Hormone replacement therapy (postmenopausal): Secondary | ICD-10-CM

## 2022-08-01 DIAGNOSIS — Z0000 Encounter for general adult medical examination without abnormal findings: Secondary | ICD-10-CM

## 2022-08-01 DIAGNOSIS — Z136 Encounter for screening for cardiovascular disorders: Secondary | ICD-10-CM

## 2022-08-01 DIAGNOSIS — K219 Gastro-esophageal reflux disease without esophagitis: Secondary | ICD-10-CM | POA: Diagnosis not present

## 2022-08-01 DIAGNOSIS — M8588 Other specified disorders of bone density and structure, other site: Secondary | ICD-10-CM

## 2022-08-01 DIAGNOSIS — N952 Postmenopausal atrophic vaginitis: Secondary | ICD-10-CM

## 2022-08-01 DIAGNOSIS — B009 Herpesviral infection, unspecified: Secondary | ICD-10-CM

## 2022-08-01 MED ORDER — TRAZODONE HCL 50 MG PO TABS: ORAL_TABLET | Freq: Every day | ORAL | 4 refills | Status: DC

## 2022-08-01 NOTE — Assessment & Plan Note (Signed)
Chronic, stable.  Continue daily Nexium and adjust regimen as needed.  Had colonoscopy and EGD in 2020.  Mag level today.

## 2022-08-01 NOTE — Assessment & Plan Note (Signed)
Continue collaboration with GYN at [facility].

## 2022-08-01 NOTE — Assessment & Plan Note (Signed)
Ongoing, continue post coital abx prophylaxis as needed to avoid UTI.  Refills as needed.

## 2022-08-01 NOTE — Assessment & Plan Note (Signed)
History of with occasional flares, currently no flares.  Valtrex refill sent in.

## 2022-08-01 NOTE — Progress Notes (Signed)
BP 90/62   Pulse 79   Temp 98.3 F (36.8 C) (Oral)   Ht '5\' 3"'$  (1.6 m)   Wt 148 lb 9.6 oz (67.4 kg)   SpO2 98%   BMI 26.32 kg/m    Subjective:    Patient ID: [name], female    DOB: 09-Feb-1961, 61 y.o.   MRN: [id number]  HPI: [name] is a 61 y.o. female presenting on 08/01/2022 for comprehensive medical examination. Current medical complaints include:none  She currently lives with: husband Menopausal Symptoms: no   Has labs at her workplace May 2023 -- LDL 94, TRIGS 138, Total Chol 179,   Followed by GYN for menopausal symptoms and last saw 07/03/22 -- goes for pap at end of month.  She receives Testosterone injections at their office and continues on Progesterone and Yuvafem.  On current regimen she is able to function better.    Continues on Macrobid after intercourse as preventative.   INSOMNIA Continues on Trazodone for sleep. Duration: chronic Satisfied with sleep quality: yes Difficulty falling asleep: at times Difficulty staying asleep: no Waking a few hours after sleep onset: no Early morning awakenings: no Daytime hypersomnolence: no Wakes feeling refreshed: no Good sleep hygiene: no Apnea: no Snoring: no Depressed/anxious mood: no Recent stress: no Restless legs/nocturnal leg cramps: no Chronic pain/arthritis: no History of sleep study: no Treatments attempted: Trazodone and ambien    GERD Continues on Nexium 20 MG daily. GERD control status: stable  Satisfied with current treatment? yes Heartburn frequency: none Medication side effects: no  Medication compliance: stable Dysphagia: no Odynophagia:  no Hematemesis: no Blood in stool: no EGD: yes  Depression Screen done today and results listed below:     08/01/2022   10:22 AM 07/31/2021    9:11 AM 01/22/2021   10:31 AM 07/12/2020   10:51 AM 05/23/2020    1:17 PM  Depression screen PHQ 2/9  Decreased Interest 0 0 0 0 0  Down, Depressed, Hopeless 0 0 0 0 0  PHQ - 2 Score 0 0 0 0 0   Altered sleeping 1      Tired, decreased energy 0      Change in appetite 0      Feeling bad or failure about yourself  0      Trouble concentrating 0      Moving slowly or fidgety/restless 0      Suicidal thoughts 0      PHQ-9 Score 1      Difficult doing work/chores Not difficult at all          07/12/2020   10:51 AM 01/22/2021   10:31 AM 09/24/2021    9:46 AM 08/01/2022   10:22 AM 08/01/2022   10:27 AM  Fall Risk  Falls in the past year? 0 0  0 0  Was there an injury with Fall? 0   0 0  Fall Risk Category Calculator 0   0 0  Fall Risk Category Low   Low Low  Patient Fall Risk Level Low fall risk  Moderate fall risk Low fall risk Low fall risk  Patient at Risk for Falls Due to    No Fall Risks No Fall Risks  Fall risk Follow up Falls evaluation completed   Falls evaluation completed Falls evaluation completed    Functional Status Survey: Is the patient deaf or have difficulty hearing?: No Does the patient have difficulty seeing, even when wearing glasses/contacts?: No Does the patient have difficulty concentrating, remembering,  or making decisions?: No Does the patient have difficulty walking or climbing stairs?: No Does the patient have difficulty dressing or bathing?: No Does the patient have difficulty doing errands alone such as visiting a doctor's office or shopping?: No   Past Medical History:  Past Medical History:  Diagnosis Date   Complication of anesthesia    PONV (postoperative nausea and vomiting)     Surgical History:  Past Surgical History:  Procedure Laterality Date   AUGMENTATION MAMMAPLASTY Bilateral 2010   BICEPT TENODESIS Right 09/24/2021   Procedure: BICEPS TENODESIS;  Surgeon: [name], MD;  Location: [place] [facility];  Service: Orthopedics;  Laterality: Right;   CERVICAL SPINE SURGERY  2003   C5-C7- double dissectomy   PAROTID GLAND TUMOR EXCISION     SHOULDER ARTHROSCOPY Right 09/24/2021   Procedure: right shoulder arthroscopy, debridement,open  rotator cuff tear repair;  Surgeon: [name], MD;  Location: [place];  Service: Orthopedics;  Laterality: Right;   WISDOM TOOTH EXTRACTION Bilateral     Medications:  Current Outpatient Medications on File Prior to Visit  Medication Sig   Biotin 5 MG CAPS Take 5 mg by mouth daily.   CALCIUM PO Take 800 mg by mouth daily.   celecoxib (CELEBREX) 100 MG capsule Take 1 capsule (100 mg total) by mouth 2 (two) times daily.   Cholecalciferol 50 MCG (2000 UT) CAPS Take 2,000 Units by mouth daily.   esomeprazole (NEXIUM) 20 MG capsule Take 20 mg by mouth daily.   Estradiol (YUVAFEM) 10 MCG TABS vaginal tablet Place 1 tablet (10 mcg total) vaginally twice a week   Melatonin 5 MG CAPS Take 5 mg by mouth at bedtime.   methocarbamol (ROBAXIN) 500 MG tablet Take 1 tablet (500 mg total) by mouth every 8 (eight) hours as needed for muscle spasms.   nitrofurantoin, macrocrystal-monohydrate, (MACROBID) 100 MG capsule Take 1 tablet (100 MG) by mouth only after intercourse.   Probiotic Product (ALIGN PO) Take 1 tablet by mouth daily.   progesterone (PROMETRIUM) 200 MG capsule Take 1 capsule by mouth at bedtime.   testosterone cypionate (DEPOTESTOSTERONE CYPIONATE) 200 MG/ML injection Inject 18 mg into the muscle every 28 (twenty-eight) days.   valACYclovir (VALTREX) 500 MG tablet Take 2 (two) tablets by mouth once daily for total of 5 days as needed for outbreaks.   No current facility-administered medications on file prior to visit.    Allergies:  Allergies  Allergen Reactions   Cyclobenzaprine     Altered mental state    Pertussis Vaccines Swelling    Social History:  Social History   Socioeconomic History   Marital status: Married    Spouse name: Not on file   Number of children: Not on file   Years of education: Not on file   Highest education level: Not on file  Occupational History   Not on file  Tobacco Use   Smoking status: Never   Smokeless tobacco: Never  Vaping Use    Vaping Use: Never used  Substance and Sexual Activity   Alcohol use: Yes    Comment: ocassional   Drug use: Never   Sexual activity: Yes  Other Topics Concern   Not on file  Social History Narrative   Not on file   Social Determinants of Health   Financial Resource Strain: Low Risk  (05/23/2020)   Overall Financial Resource Strain (CARDIA)    Difficulty of Paying Living Expenses: Not hard at all  Food Insecurity: No Food Insecurity (05/23/2020)  Hunger Vital Sign    Worried About Running Out of Food in the Last Year: Never true    Ran Out of Food in the Last Year: Never true  Transportation Needs: No Transportation Needs (05/23/2020)   PRAPARE - Hydrologist (Medical): No    Lack of Transportation (Non-Medical): No  Physical Activity: Insufficiently Active (05/23/2020)   Exercise Vital Sign    Days of Exercise per Week: 3 days    Minutes of Exercise per Session: 30 min  Stress: Stress Concern Present (05/23/2020)   [place]    Feeling of Stress : Rather much  Social Connections: Moderately Isolated (05/23/2020)   Social Connection and Isolation Panel [NHANES]    Frequency of Communication with Friends and Family: Three times a week    Frequency of Social Gatherings with Friends and Family: Three times a week    Attends Religious Services: Never    Active Member of Clubs or Organizations: No    Attends Music therapist: Never    Marital Status: Married  Human resources officer Violence: Not on file   Social History   Tobacco Use  Smoking Status Never  Smokeless Tobacco Never   Social History   Substance and Sexual Activity  Alcohol Use Yes   Comment: ocassional    Family History:  Family History  Problem Relation Age of Onset   Lung cancer Mother    Thyroid disease Mother    Hypertension Mother    Hyperlipidemia Mother    CAD Mother        stent x 1   Osteoporosis  Mother    Leukemia Father 69   Rheum arthritis Sister    Melanoma Sister    Osteopenia Sister    Autoimmune disease Daughter    Colon cancer Maternal Uncle    Heart disease Maternal Grandmother    Colon cancer Maternal Grandfather    Heart disease Maternal Grandfather    Stroke Maternal Grandfather    Heart disease Paternal Grandfather     Past medical history, surgical history, medications, allergies, family history and social history reviewed with patient today and changes made to appropriate areas of the chart.   Review of Systems - negative All other ROS negative except what is listed above and in the HPI.      Objective:    BP 90/62   Pulse 79   Temp 98.3 F (36.8 C) (Oral)   Ht '5\' 3"'$  (1.6 m)   Wt 148 lb 9.6 oz (67.4 kg)   SpO2 98%   BMI 26.32 kg/m   Wt Readings from Last 3 Encounters:  08/01/22 148 lb 9.6 oz (67.4 kg)  09/24/21 136 lb (61.7 kg)  09/11/21 140 lb 8 oz (63.7 kg)    Physical Exam Vitals and nursing note reviewed. Exam conducted with a chaperone present.  Constitutional:      General: She is awake. She is not in acute distress.    Appearance: She is well-developed. She is not ill-appearing.  HENT:     Head: Normocephalic and atraumatic.     Right Ear: Hearing, tympanic membrane, ear canal and external ear normal. No drainage.     Left Ear: Hearing, tympanic membrane, ear canal and external ear normal. No drainage.     Nose: Nose normal.     Right Sinus: No maxillary sinus tenderness or frontal sinus tenderness.     Left Sinus: No maxillary sinus  tenderness or frontal sinus tenderness.     Mouth/Throat:     Mouth: Mucous membranes are moist.     Pharynx: Oropharynx is clear. Uvula midline. No pharyngeal swelling, oropharyngeal exudate or posterior oropharyngeal erythema.  Eyes:     General: Lids are normal.        Right eye: No discharge.        Left eye: No discharge.     Extraocular Movements: Extraocular movements intact.      Conjunctiva/sclera: Conjunctivae normal.     Pupils: Pupils are equal, round, and reactive to light.     Visual Fields: Right eye visual fields normal and left eye visual fields normal.  Neck:     Thyroid: No thyromegaly.     Vascular: No carotid bruit.     Trachea: Trachea normal.  Cardiovascular:     Rate and Rhythm: Normal rate and regular rhythm.     Heart sounds: Normal heart sounds. No murmur heard.    No gallop.  Pulmonary:     Effort: Pulmonary effort is normal. No accessory muscle usage or respiratory distress.     Breath sounds: Normal breath sounds.  Chest:  Breasts:    Right: Normal.     Left: Normal.  Abdominal:     General: Bowel sounds are normal.     Palpations: Abdomen is soft. There is no hepatomegaly or splenomegaly.     Tenderness: There is no abdominal tenderness.  Musculoskeletal:        General: Normal range of motion.     Cervical back: Normal range of motion and neck supple.     Right lower leg: No edema.     Left lower leg: No edema.  Lymphadenopathy:     Head:     Right side of head: No submental, submandibular, tonsillar, preauricular or posterior auricular adenopathy.     Left side of head: No submental, submandibular, tonsillar, preauricular or posterior auricular adenopathy.     Cervical: No cervical adenopathy.     Upper Body:     Right upper body: No supraclavicular, axillary or pectoral adenopathy.     Left upper body: No supraclavicular, axillary or pectoral adenopathy.  Skin:    General: Skin is warm and dry.     Capillary Refill: Capillary refill takes less than 2 seconds.     Findings: No rash.  Neurological:     Mental Status: She is alert and oriented to person, place, and time.     Gait: Gait is intact.     Deep Tendon Reflexes: Reflexes are normal and symmetric.     Reflex Scores:      Brachioradialis reflexes are 2+ on the right side and 2+ on the left side.      Patellar reflexes are 2+ on the right side and 2+ on the left  side. Psychiatric:        Attention and Perception: Attention normal.        Mood and Affect: Mood normal.        Speech: Speech normal.        Behavior: Behavior normal. Behavior is cooperative.        Thought Content: Thought content normal.        Judgment: Judgment normal.    Results for orders placed or performed during the hospital encounter of 09/11/21  CBC  Result Value Ref Range   WBC 4.7 4.0 - 10.5 K/uL   RBC 4.67 3.87 - 5.11 MIL/uL   Hemoglobin  13.4 12.0 - 15.0 g/dL   HCT 41.7 36.0 - 46.0 %   MCV 89.3 80.0 - 100.0 fL   MCH 28.7 26.0 - 34.0 pg   MCHC 32.1 30.0 - 36.0 g/dL   RDW 12.1 11.5 - 15.5 %   Platelets 196 150 - 400 K/uL   nRBC 0.0 0.0 - 0.2 %  Basic metabolic panel  Result Value Ref Range   Sodium 136 135 - 145 mmol/L   Potassium 4.0 3.5 - 5.1 mmol/L   Chloride 100 98 - 111 mmol/L   CO2 28 22 - 32 mmol/L   Glucose, Bld 110 (H) 70 - 99 mg/dL   BUN 17 6 - 20 mg/dL   Creatinine, Ser 0.74 0.44 - 1.00 mg/dL   Calcium 9.5 8.9 - 10.3 mg/dL   GFR, Estimated >60 >60 mL/min   Anion gap 8 5 - 15      Assessment & Plan:   Problem List Items Addressed This Visit       Digestive   GERD (gastroesophageal reflux disease)    Chronic, stable.  Continue daily Nexium and adjust regimen as needed.  Had colonoscopy and EGD in 2020.  Mag level today.        Musculoskeletal and Integument   Osteopenia - Primary    Ongoing, noted on DEXA. Continue daily Vitamin D3 and Calcium supplements.  Repeat DEXA in 2 years (01/02/23), family history and hormone treatment.  Vit D level today.      Relevant Orders   VITAMIN D 25 Hydroxy (Vit-D Deficiency, Fractures)     Genitourinary   Vaginal atrophy    Ongoing, continue post coital abx prophylaxis as needed to avoid UTI.  Refills as needed.      Relevant Orders   TSH     Other   Herpes    History of with occasional flares, currently no flares.  Valtrex refill sent in.      Insomnia    Chronic, stable.  Continue  current medication regimen, Trazodone and Melatonin, adjust as needed.  Continue focus on good sleep hygiene techniques at home.        Relevant Orders   TSH   Postmenopausal hormone therapy    Continue collaboration with GYN at [facility]      Relevant Orders   CBC with Differential/Platelet   Comprehensive metabolic panel   Other Visit Diagnoses     Encounter for lipid screening for cardiovascular disease       Lipid panel today.   Relevant Orders   Comprehensive metabolic panel   Encounter for annual physical exam       Annual physical today with labs and health maintenance reviewed, discussed with patient.        Follow up plan: Return in about 1 year (around 08/02/2023) for Annual physical.  LABORATORY TESTING:  - Pap smear: up to date  IMMUNIZATIONS:   - Tdap: Tetanus vaccination status reviewed: last tetanus booster within 10 years. - Influenza: Up to date - Pneumovax: Not applicable - Prevnar: Not applicable - HPV: Not applicable - Zostavax vaccine: will think about this  SCREENING: -Mammogram: Up To Date 04/11/22 - Colonoscopy: Up to date  - Bone Density: Up To Date -- on 01/02/21 -- osteopenia -Hearing Test: Not applicable  -Spirometry: Not applicable   PATIENT COUNSELING:   Advised to take 1 mg of folate supplement per day if capable of pregnancy.   Sexuality: Discussed sexually transmitted diseases, partner selection, use of condoms, avoidance  of unintended pregnancy  and contraceptive alternatives.   Advised to avoid cigarette smoking.  I discussed with the patient that most people either abstain from alcohol or drink within safe limits (<=14/week and <=4 drinks/occasion for males, <=7/weeks and <= 3 drinks/occasion for females) and that the risk for alcohol disorders and other health effects rises proportionally with the number of drinks per week and how often a drinker exceeds daily limits.  Discussed cessation/primary prevention of drug use  and availability of treatment for abuse.   Diet: Encouraged to adjust caloric intake to maintain  or achieve ideal body weight, to reduce intake of dietary saturated fat and total fat, to limit sodium intake by avoiding high sodium foods and not adding table salt, and to maintain adequate dietary potassium and calcium preferably from fresh fruits, vegetables, and low-fat dairy products.    Stressed the importance of regular exercise  Injury prevention: Discussed safety belts, safety helmets, smoke detector, smoking near bedding or upholstery.   Dental health: Discussed importance of regular tooth brushing, flossing, and dental visits.    NEXT PREVENTATIVE PHYSICAL DUE IN 1 YEAR. Return in about 1 year (around 08/02/2023) for Annual physical.

## 2022-08-01 NOTE — Assessment & Plan Note (Addendum)
Ongoing, noted on DEXA. Continue daily Vitamin D3 and Calcium supplements.  Repeat DEXA in 2 years (01/02/23), family history and hormone treatment.  Vit D level today.

## 2022-08-01 NOTE — Assessment & Plan Note (Addendum)
Chronic, stable.  Continue current medication regimen, Trazodone and Melatonin, adjust as needed.  Continue focus on good sleep hygiene techniques at home.

## 2022-08-02 LAB — CBC WITH DIFFERENTIAL/PLATELET
Basophils Absolute: 0 10*3/uL (ref 0.0–0.2)
Basos: 0 %
EOS (ABSOLUTE): 0.1 10*3/uL (ref 0.0–0.4)
Eos: 1 %
Hematocrit: 42.9 % (ref 34.0–46.6)
Hemoglobin: 14.1 g/dL (ref 11.1–15.9)
Immature Grans (Abs): 0 10*3/uL (ref 0.0–0.1)
Immature Granulocytes: 0 %
Lymphocytes Absolute: 2.3 10*3/uL (ref 0.7–3.1)
Lymphs: 39 %
MCH: 28.7 pg (ref 26.6–33.0)
MCHC: 32.9 g/dL (ref 31.5–35.7)
MCV: 87 fL (ref 79–97)
Monocytes Absolute: 0.5 10*3/uL (ref 0.1–0.9)
Monocytes: 8 %
Neutrophils Absolute: 3 10*3/uL (ref 1.4–7.0)
Neutrophils: 52 %
Platelets: 233 10*3/uL (ref 150–450)
RBC: 4.92 x10E6/uL (ref 3.77–5.28)
RDW: 12.5 % (ref 11.7–15.4)
WBC: 5.8 10*3/uL (ref 3.4–10.8)

## 2022-08-02 LAB — COMPREHENSIVE METABOLIC PANEL
ALT: 11 IU/L (ref 0–32)
AST: 18 IU/L (ref 0–40)
Albumin/Globulin Ratio: 2.7 — ABNORMAL HIGH (ref 1.2–2.2)
Albumin: 4.9 g/dL (ref 3.9–4.9)
Alkaline Phosphatase: 86 IU/L (ref 44–121)
BUN/Creatinine Ratio: 27 (ref 12–28)
BUN: 22 mg/dL (ref 8–27)
Bilirubin Total: 0.3 mg/dL (ref 0.0–1.2)
CO2: 22 mmol/L (ref 20–29)
Calcium: 9.5 mg/dL (ref 8.7–10.3)
Chloride: 98 mmol/L (ref 96–106)
Creatinine, Ser: 0.81 mg/dL (ref 0.57–1.00)
Globulin, Total: 1.8 g/dL (ref 1.5–4.5)
Glucose: 89 mg/dL (ref 70–99)
Potassium: 4.7 mmol/L (ref 3.5–5.2)
Sodium: 136 mmol/L (ref 134–144)
Total Protein: 6.7 g/dL (ref 6.0–8.5)
eGFR: 83 mL/min/{1.73_m2} (ref >59)

## 2022-08-02 LAB — VITAMIN D 25 HYDROXY (VIT D DEFICIENCY, FRACTURES): Vit D, 25-Hydroxy: 47.5 ng/mL (ref 30.0–100.0)

## 2022-08-02 LAB — TSH: TSH: 2.63 u[IU]/mL (ref 0.450–4.500)

## 2022-08-02 NOTE — Progress Notes (Signed)
Contacted via MyChart   Good morning [name], your labs have returned and overall these look fantastic!!  No changes needed.  Great job!! Keep being amazing!!  Thank you for allowing me to participate in your care.  I appreciate you. Kindest regards, Exie Chrismer

## 2022-08-08 DIAGNOSIS — Z79890 Hormone replacement therapy (postmenopausal): Secondary | ICD-10-CM | POA: Diagnosis not present

## 2022-08-08 DIAGNOSIS — F520 Hypoactive sexual desire disorder: Secondary | ICD-10-CM | POA: Diagnosis not present

## 2022-08-19 DIAGNOSIS — Z7189 Other specified counseling: Secondary | ICD-10-CM | POA: Diagnosis not present

## 2022-08-19 DIAGNOSIS — Z01419 Encounter for gynecological examination (general) (routine) without abnormal findings: Secondary | ICD-10-CM | POA: Diagnosis not present

## 2022-08-19 DIAGNOSIS — Z1331 Encounter for screening for depression: Secondary | ICD-10-CM | POA: Diagnosis not present

## 2022-08-19 DIAGNOSIS — Z79890 Hormone replacement therapy (postmenopausal): Secondary | ICD-10-CM | POA: Diagnosis not present

## 2022-08-26 ENCOUNTER — Encounter: Payer: Self-pay | Admitting: Nurse Practitioner

## 2022-09-16 ENCOUNTER — Encounter: Payer: Self-pay | Admitting: Nurse Practitioner

## 2022-09-17 ENCOUNTER — Encounter: Payer: 59 | Admitting: Dermatology

## 2022-09-17 ENCOUNTER — Other Ambulatory Visit: Payer: Self-pay

## 2022-09-17 MED ORDER — TRAZODONE HCL 50 MG PO TABS: 75.0000 mg | ORAL_TABLET | Freq: Every day | ORAL | 4 refills | Status: DC

## 2022-09-17 MED ORDER — TRAZODONE HCL 50 MG PO TABS
75.0000 mg | ORAL_TABLET | Freq: Every day | ORAL | 4 refills | Status: DC
  Filled 2022-09-17: qty 135, 90d supply, fill #0

## 2022-09-17 NOTE — Addendum Note (Signed)
Addended by: Marnee Guarneri T on: 09/17/2022 09:19 AM   Modules accepted: Orders

## 2022-09-25 ENCOUNTER — Encounter: Payer: BC Managed Care – PPO | Admitting: Dermatology

## 2022-09-25 ENCOUNTER — Encounter: Payer: Self-pay | Admitting: Dermatology

## 2022-10-07 ENCOUNTER — Ambulatory Visit: Payer: BC Managed Care – PPO | Admitting: Dermatology

## 2022-10-14 ENCOUNTER — Ambulatory Visit (INDEPENDENT_AMBULATORY_CARE_PROVIDER_SITE_OTHER): Payer: BC Managed Care – PPO | Admitting: Dermatology

## 2022-10-14 ENCOUNTER — Encounter: Payer: Self-pay | Admitting: Dermatology

## 2022-10-14 DIAGNOSIS — D2372 Other benign neoplasm of skin of left lower limb, including hip: Secondary | ICD-10-CM

## 2022-10-14 DIAGNOSIS — L821 Other seborrheic keratosis: Secondary | ICD-10-CM

## 2022-10-14 DIAGNOSIS — L905 Scar conditions and fibrosis of skin: Secondary | ICD-10-CM

## 2022-10-14 DIAGNOSIS — D2361 Other benign neoplasm of skin of right upper limb, including shoulder: Secondary | ICD-10-CM

## 2022-10-14 DIAGNOSIS — L578 Other skin changes due to chronic exposure to nonionizing radiation: Secondary | ICD-10-CM | POA: Diagnosis not present

## 2022-10-14 DIAGNOSIS — L570 Actinic keratosis: Secondary | ICD-10-CM

## 2022-10-14 DIAGNOSIS — L814 Other melanin hyperpigmentation: Secondary | ICD-10-CM

## 2022-10-14 DIAGNOSIS — D229 Melanocytic nevi, unspecified: Secondary | ICD-10-CM

## 2022-10-14 NOTE — Patient Instructions (Addendum)
Cryotherapy Aftercare  Wash gently with soap and water everyday.   Apply Vaseline and Band-Aid daily until healed.     Due to recent changes in healthcare laws, you may see results of your pathology and/or laboratory studies on MyChart before the doctors have had a chance to review them. We understand that in some cases there may be results that are confusing or concerning to you. Please understand that not all results are received at the same time and often the doctors may need to interpret multiple results in order to provide you with the best plan of care or course of treatment. Therefore, we ask that you please give us 2 business days to thoroughly review all your results before contacting the office for clarification. Should we see a critical lab result, you will be contacted sooner.   If You Need Anything After Your Visit  If you have any questions or concerns for your doctor, please call our main line at [phone] and press option 4 to reach your doctor's medical assistant. If no one answers, please leave a voicemail as directed and we will return your call as soon as possible. Messages left after 4 pm will be answered the following business day.   You may also send us a message via MyChart. We typically respond to MyChart messages within 1-2 business days.  For prescription refills, please ask your pharmacy to contact our office. Our fax number is [phone].  If you have an urgent issue when the clinic is closed that cannot wait until the next business day, you can page your doctor at the number below.    Please note that while we do our best to be available for urgent issues outside of office hours, we are not available 24/7.   If you have an urgent issue and are unable to reach us, you may choose to seek medical care at your doctor's office, retail clinic, urgent care center, or emergency room.  If you have a medical emergency, please immediately call 911 or go to the  emergency department.  Pager Numbers  - Dr. Kowalski: [phone]  - Dr. Moye: [phone]  - Dr. Stewart: [phone]  In the event of inclement weather, please call our main line at [phone] for an update on the status of any delays or closures.  Dermatology Medication Tips: Please keep the boxes that topical medications come in in order to help keep track of the instructions about where and how to use these. Pharmacies typically print the medication instructions only on the boxes and not directly on the medication tubes.   If your medication is too expensive, please contact our office at [phone] option 4 or send us a message through MyChart.   We are unable to tell what your co-pay for medications will be in advance as this is different depending on your insurance coverage. However, we may be able to find a substitute medication at lower cost or fill out paperwork to get insurance to cover a needed medication.   If a prior authorization is required to get your medication covered by your insurance company, please allow us 1-2 business days to complete this process.  Drug prices often vary depending on where the prescription is filled and some pharmacies may offer cheaper prices.  The website www.goodrx.com contains coupons for medications through different pharmacies. The prices here do not account for what the cost may be with help from insurance (it may be cheaper with your insurance), but the website can   give you the price if you did not use any insurance.  - You can print the associated coupon and take it with your prescription to the pharmacy.  - You may also stop by our office during regular business hours and pick up a GoodRx coupon card.  - If you need your prescription sent electronically to a different pharmacy, notify our office through [facility] MyChart or by phone at [phone] option 4.     Si Usted Necesita Algo Despus de Su Visita  Tambin puede  enviarnos un mensaje a travs de MyChart. Por lo general respondemos a los mensajes de MyChart en el transcurso de 1 a 2 das hbiles.  Para renovar recetas, por favor pida a su farmacia que se ponga en contacto con nuestra oficina. Nuestro nmero de fax es el [phone].  Si tiene un asunto urgente cuando la clnica est cerrada y que no puede esperar hasta el siguiente da hbil, puede llamar/localizar a su doctor(a) al nmero que aparece a continuacin.   Por favor, tenga en cuenta que aunque hacemos todo lo posible para estar disponibles para asuntos urgentes fuera del horario de oficina, no estamos disponibles las 24 horas del da, los 7 das de la semana.   Si tiene un problema urgente y no puede comunicarse con nosotros, puede optar por buscar atencin mdica  en el consultorio de su doctor(a), en una clnica privada, en un centro de atencin urgente o en una sala de emergencias.  Si tiene una emergencia mdica, por favor llame inmediatamente al 911 o vaya a la sala de emergencias.  Nmeros de bper  - Dr. Kowalski: [phone]  - Dra. Moye: [phone]  - Dra. Stewart: [phone]  En caso de inclemencias del tiempo, por favor llame a nuestra lnea principal al [phone] para una actualizacin sobre el estado de cualquier retraso o cierre.  Consejos para la medicacin en dermatologa: Por favor, guarde las cajas en las que vienen los medicamentos de uso tpico para ayudarle a seguir las instrucciones sobre dnde y cmo usarlos. Las farmacias generalmente imprimen las instrucciones del medicamento slo en las cajas y no directamente en los tubos del medicamento.   Si su medicamento es muy caro, por favor, pngase en contacto con nuestra oficina llamando al [phone] y presione la opcin 4 o envenos un mensaje a travs de MyChart.   No podemos decirle cul ser su copago por los medicamentos por adelantado ya que esto es diferente dependiendo de la cobertura de su seguro.  Sin embargo, es posible que podamos encontrar un medicamento sustituto a menor costo o llenar un formulario para que el seguro cubra el medicamento que se considera necesario.   Si se requiere una autorizacin previa para que su compaa de seguros cubra su medicamento, por favor permtanos de 1 a 2 das hbiles para completar este proceso.  Los precios de los medicamentos varan con frecuencia dependiendo del lugar de dnde se surte la receta y alguna farmacias pueden ofrecer precios ms baratos.  El sitio web www.goodrx.com tiene cupones para medicamentos de diferentes farmacias. Los precios aqu no tienen en cuenta lo que podra costar con la ayuda del seguro (puede ser ms barato con su seguro), pero el sitio web puede darle el precio si no utiliz ningn seguro.  - Puede imprimir el cupn correspondiente y llevarlo con su receta a la farmacia.  - Tambin puede pasar por nuestra oficina durante el horario de atencin regular y recoger una tarjeta de cupones de GoodRx.  -   Si necesita que su receta se enve electrnicamente a una farmacia diferente, informe a nuestra oficina a travs de MyChart de [place] o por telfono llamando al [phone] y presione la opcin 4.  

## 2022-10-14 NOTE — Progress Notes (Unsigned)
Follow-Up Visit   Subjective  [name] is a 61 y.o. female who presents for the following: Annual Exam.  Hx of Aks on her chest treated with 5FU/Calcipotriene 1 year ago.  The patient presents for Total-Body Skin Exam (TBSE) for skin cancer screening and mole check.  The patient has spots, moles and lesions to be evaluated, some may be new or changing and the patient has concerns that these could be cancer.   The following portions of the chart were reviewed this encounter and updated as appropriate:   Tobacco  Allergies  Meds  Problems  Med Hx  Surg Hx  Fam Hx      Review of Systems:  No other skin or systemic complaints except as noted in HPI or Assessment and Plan.  Objective  Well appearing patient in no apparent distress; mood and affect are within normal limits.  A full examination was performed including scalp, head, eyes, ears, nose, lips, neck, chest, axillae, abdomen, back, buttocks, bilateral upper extremities, bilateral lower extremities, hands, feet, fingers, toes, fingernails, and toenails. All findings within normal limits unless otherwise noted below.  right lateral cheek White slightly atrophic patch without features suspicious for malignancy on dermoscopy   right chest x 1 Erythematous thin papules/macules with gritty scale.     Assessment & Plan  Scar right lateral cheek  Benign-appearing.  Observation.  Call clinic for new or changing moles.  Recommend daily use of broad spectrum spf 30+ sunscreen to sun-exposed areas.    AK (actinic keratosis) right chest x 1  Actinic keratoses are precancerous spots that appear secondary to cumulative UV radiation exposure/sun exposure over time. They are chronic with expected duration over 1 year. A portion of actinic keratoses will progress to squamous cell carcinoma of the skin. It is not possible to reliably predict which spots will progress to skin cancer and so treatment is recommended to prevent  development of skin cancer.  Recommend daily broad spectrum sunscreen SPF 30+ to sun-exposed areas, reapply every 2 hours as needed.  Recommend staying in the shade or wearing long sleeves, sun glasses (UVA+UVB protection) and wide brim hats (4-inch brim around the entire circumference of the hat). Call for new or changing lesions.   Destruction of lesion - right chest x 1 Complexity: simple   Destruction method: cryotherapy   Informed consent: discussed and consent obtained   Timeout:  patient name, date of birth, surgical site, and procedure verified Lesion destroyed using liquid nitrogen: Yes   Region frozen until ice ball extended beyond lesion: Yes   Outcome: patient tolerated procedure well with no complications   Post-procedure details: wound care instructions given   Additional details:  Prior to procedure, discussed risks of blister formation, small wound, skin dyspigmentation, or rare scar following cryotherapy. Recommend Vaseline ointment to treated areas while healing.    Lentigines - Scattered tan macules - Due to sun exposure - Benign-appearing, observe - Recommend daily broad spectrum sunscreen SPF 30+ to sun-exposed areas, reapply every 2 hours as needed. - Call for any changes  Seborrheic Keratoses - Stuck-on, waxy, tan-brown papules and/or plaques  - Benign-appearing - Discussed benign etiology and prognosis. - Observe - Call for any changes  Melanocytic Nevi - Tan-brown and/or pink-flesh-colored symmetric macules and papules - Benign appearing on exam today - Observation - Call clinic for new or changing moles - Recommend daily use of broad spectrum spf 30+ sunscreen to sun-exposed areas.   Hemangiomas - Red papules - Discussed benign  nature - Observe - Call for any changes  Dermatofibroma Right upper arm, left ankle  - Firm pink/brown papulenodule with dimple sign - Benign appearing - Call for any changes   Actinic Damage - Chronic condition,  secondary to cumulative UV/sun exposure - diffuse scaly erythematous macules with underlying dyspigmentation - Recommend daily broad spectrum sunscreen SPF 30+ to sun-exposed areas, reapply every 2 hours as needed.  - Staying in the shade or wearing long sleeves, sun glasses (UVA+UVB protection) and wide brim hats (4-inch brim around the entire circumference of the hat) are also recommended for sun protection.  - Call for new or changing lesions.  Skin cancer screening performed today.   Return in about 1 year (around 10/15/2023) for TBSE, hx of AKs .  I, Marye Round, CMA, am acting as scribe for Forest Gleason, MD .   Documentation: I have reviewed the above documentation for accuracy and completeness, and I agree with the above.  Forest Gleason, MD

## 2022-10-15 ENCOUNTER — Encounter: Payer: Self-pay | Admitting: Dermatology

## 2022-10-16 DIAGNOSIS — Z79890 Hormone replacement therapy (postmenopausal): Secondary | ICD-10-CM | POA: Diagnosis not present

## 2022-10-16 DIAGNOSIS — Z7189 Other specified counseling: Secondary | ICD-10-CM | POA: Diagnosis not present

## 2022-10-16 DIAGNOSIS — F520 Hypoactive sexual desire disorder: Secondary | ICD-10-CM | POA: Diagnosis not present

## 2022-11-12 ENCOUNTER — Telehealth: Payer: BC Managed Care – PPO | Admitting: Family Medicine

## 2022-11-12 ENCOUNTER — Ambulatory Visit: Payer: Self-pay | Admitting: *Deleted

## 2022-11-12 DIAGNOSIS — U071 COVID-19: Secondary | ICD-10-CM | POA: Diagnosis not present

## 2022-11-12 MED ORDER — BENZONATATE 100 MG PO CAPS: 200.0000 mg | ORAL_CAPSULE | Freq: Two times a day (BID) | ORAL | 0 refills | Status: DC | PRN

## 2022-11-12 MED ORDER — FLUTICASONE PROPIONATE 50 MCG/ACT NA SUSP: 2.0000 | Freq: Every day | NASAL | 0 refills | Status: DC

## 2022-11-12 MED ORDER — PROMETHAZINE-DM 6.25-15 MG/5ML PO SYRP: 5.0000 mL | ORAL_SOLUTION | Freq: Four times a day (QID) | ORAL | 0 refills | Status: DC | PRN

## 2022-11-12 MED ORDER — MOLNUPIRAVIR EUA 200MG CAPSULE: 4.0000 | ORAL_CAPSULE | Freq: Two times a day (BID) | ORAL | 0 refills | Status: AC

## 2022-11-12 NOTE — Progress Notes (Signed)
Virtual Visit Consent   [name], you are scheduled for a virtual visit with a [facility] provider today. Just as with appointments in the office, your consent must be obtained to participate. Your consent will be active for this visit and any virtual visit you may have with one of our providers in the next 365 days. If you have a MyChart account, a copy of this consent can be sent to you electronically.  As this is a virtual visit, video technology does not allow for your provider to perform a traditional examination. This may limit your provider's ability to fully assess your condition. If your provider identifies any concerns that need to be evaluated in person or the need to arrange testing (such as labs, EKG, etc.), we will make arrangements to do so. Although advances in technology are sophisticated, we cannot ensure that it will always work on either your end or our end. If the connection with a video visit is poor, the visit may have to be switched to a telephone visit. With either a video or telephone visit, we are not always able to ensure that we have a secure connection.  By engaging in this virtual visit, you consent to the provision of healthcare and authorize for your insurance to be billed (if applicable) for the services provided during this visit. Depending on your insurance coverage, you may receive a charge related to this service.  I need to obtain your verbal consent now. Are you willing to proceed with your visit today? Ilze Bellanca has provided verbal consent on 11/12/2022 for a virtual visit (video or telephone). Perlie Mayo, NP  Date: 11/12/2022 2:22 PM  Virtual Visit via Video Note   I, Perlie Mayo, connected with  [name]  ([id number], 1961/11/03) on 11/12/22 at  2:15 PM EST by a video-enabled telemedicine application and verified that I am speaking with the correct person using two identifiers.  Location: Patient: Virtual Visit Location Patient:  Home Provider: Virtual Visit Location Provider: Home Office   I discussed the limitations of evaluation and management by telemedicine and the availability of in person appointments. The patient expressed understanding and agreed to proceed.    History of Present Illness: [name] is a 61 y.o. who identifies as a female who was assigned female at birth, and is being seen today for COVID +  Woke up feeling tired. Noted tickle in throat. Achy- headache, feels feverish with chills, but no fever as of yet. Nasal stuffiness, feels the congestion in the chest- when coughs at times has, yellow sputum. Is known for getting bronchitis easy. Tested at home today for COVID. Husband is also COVID +  Denies chest pain, shortness of breath, ear pain. COVID vaccine but has not received the booster. Has had Flu vaccine   Problems:  Patient Active Problem List   Diagnosis Date Noted   Complete tear of right rotator cuff    Degenerative superior labral anterior-to-posterior (SLAP) tear of right shoulder    Healthcare maintenance 07/31/2021   Congenital pes cavus 02/25/2021   History of uterine fibroid 01/05/2021   Herpes 07/12/2020   GERD (gastroesophageal reflux disease) 05/23/2020   Vaginal atrophy 05/23/2020   Cervical spine arthritis 05/23/2020   Osteopenia 05/23/2020   Insomnia 05/20/2020   Postmenopausal hormone therapy 05/20/2020    Allergies:  Allergies  Allergen Reactions   Cyclobenzaprine     Altered mental state    Pertussis Vaccines Swelling   Medications:  Current Outpatient Medications:  Biotin 5 MG CAPS, Take 5 mg by mouth daily., Disp: , Rfl:    CALCIUM PO, Take 800 mg by mouth daily., Disp: , Rfl:    celecoxib (CELEBREX) 100 MG capsule, Take 1 capsule (100 mg total) by mouth 2 (two) times daily., Disp: 60 capsule, Rfl: 0   Cholecalciferol 50 MCG (2000 UT) CAPS, Take 2,000 Units by mouth daily., Disp: , Rfl:    esomeprazole (NEXIUM) 20 MG capsule, Take 20 mg by mouth  daily., Disp: , Rfl:    Estradiol (YUVAFEM) 10 MCG TABS vaginal tablet, Place 1 tablet (10 mcg total) vaginally twice a week, Disp: 18 tablet, Rfl: 0   Melatonin 5 MG CAPS, Take 5 mg by mouth at bedtime., Disp: , Rfl:    methocarbamol (ROBAXIN) 500 MG tablet, Take 1 tablet (500 mg total) by mouth every 8 (eight) hours as needed for muscle spasms., Disp: 30 tablet, Rfl: 1   nitrofurantoin, macrocrystal-monohydrate, (MACROBID) 100 MG capsule, Take 1 tablet (100 MG) by mouth only after intercourse., Disp: 90 capsule, Rfl: 4   Probiotic Product (ALIGN PO), Take 1 tablet by mouth daily., Disp: , Rfl:    progesterone (PROMETRIUM) 200 MG capsule, Take 1 capsule by mouth at bedtime., Disp: , Rfl:    testosterone cypionate (DEPOTESTOSTERONE CYPIONATE) 200 MG/ML injection, Inject 18 mg into the muscle every 28 (twenty-eight) days., Disp: , Rfl:    traZODone (DESYREL) 50 MG tablet, Take 1.5 tablets (75 mg total) by mouth at bedtime., Disp: 135 tablet, Rfl: 4   valACYclovir (VALTREX) 500 MG tablet, Take 2 (two) tablets by mouth once daily for total of 5 days as needed for outbreaks., Disp: 60 tablet, Rfl: 4  Observations/Objective: Patient is well-developed, well-nourished in no acute distress.  Resting comfortably  at home.  Head is normocephalic, atraumatic.  No labored breathing.  Speech is clear and coherent with logical content.  Patient is alert and oriented at baseline.  Cough- throat clearing present during video   Assessment and Plan:  1. COVID-19  - molnupiravir EUA (LAGEVRIO) 200 mg CAPS capsule; Take 4 capsules (800 mg total) by mouth 2 (two) times daily for 5 days.  Dispense: 40 capsule; Refill: 0 - promethazine-dextromethorphan (PROMETHAZINE-DM) 6.25-15 MG/5ML syrup; Take 5 mLs by mouth 4 (four) times daily as needed for cough.  Dispense: 118 mL; Refill: 0 - benzonatate (TESSALON) 100 MG capsule; Take 2 capsules (200 mg total) by mouth 2 (two) times daily as needed for cough.  Dispense:  20 capsule; Refill: 0 - fluticasone (FLONASE) 50 MCG/ACT nasal spray; Place 2 sprays into both nostrils daily.  Dispense: 16 g; Refill: 0  -Take meds as prescribed -Rest -Use a cool mist humidifier especially during the winter months when heat dries out the air. -Flonase spray - Use saline nose sprays frequently to help soothe nasal passages and promote drainage. -Saline irrigations of the nose can be very helpful if done frequently.             * 4X daily for 1 week*             * Use of a nettie pot can be helpful with this.  *Follow directions with this* *Boiled or distilled water only -stay hydrated by drinking plenty of fluids - Keep thermostat turn down low to prevent drying out sinuses - For any cough or congestion- robitussin DM or Delsym as needed - For fever or aches or pains- take tylenol or ibuprofen as directed on bottle             *  for fevers greater than 101 orally you may alternate ibuprofen and tylenol every 3 hours.  If you do not improve you will need a follow up visit in person.                Reviewed side effects, risks and benefits of medication.    Patient acknowledged agreement and understanding of the plan.   Past Medical, Surgical, Social History, Allergies, and Medications have been Reviewed.    Follow Up Instructions: I discussed the assessment and treatment plan with the patient. The patient was provided an opportunity to ask questions and all were answered. The patient agreed with the plan and demonstrated an understanding of the instructions.  A copy of instructions were sent to the patient via MyChart unless otherwise noted below.     The patient was advised to call back or seek an in-person evaluation if the symptoms worsen or if the condition fails to improve as anticipated.  Time:  I spent 15 minutes with the patient via telehealth technology discussing the above problems/concerns.    Perlie Mayo, NP

## 2022-11-12 NOTE — Patient Instructions (Signed)
Glyn Ade, thank you for joining Perlie Mayo, NP for today's virtual visit.  While this provider is not your primary care provider (PCP), if your PCP is located in our provider database this encounter information will be shared with them immediately following your visit.   [place] account gives you access to today's visit and all your visits, tests, and labs performed at [facility] " click here if you don't have a [facility] account or go to [email or web address]  Consent: (Patient) [name] provided verbal consent for this virtual visit at the beginning of the encounter.  Current Medications:  Current Outpatient Medications:    Biotin 5 MG CAPS, Take 5 mg by mouth daily., Disp: , Rfl:    CALCIUM PO, Take 800 mg by mouth daily., Disp: , Rfl:    celecoxib (CELEBREX) 100 MG capsule, Take 1 capsule (100 mg total) by mouth 2 (two) times daily., Disp: 60 capsule, Rfl: 0   Cholecalciferol 50 MCG (2000 UT) CAPS, Take 2,000 Units by mouth daily., Disp: , Rfl:    esomeprazole (NEXIUM) 20 MG capsule, Take 20 mg by mouth daily., Disp: , Rfl:    Estradiol (YUVAFEM) 10 MCG TABS vaginal tablet, Place 1 tablet (10 mcg total) vaginally twice a week, Disp: 18 tablet, Rfl: 0   Melatonin 5 MG CAPS, Take 5 mg by mouth at bedtime., Disp: , Rfl:    methocarbamol (ROBAXIN) 500 MG tablet, Take 1 tablet (500 mg total) by mouth every 8 (eight) hours as needed for muscle spasms., Disp: 30 tablet, Rfl: 1   nitrofurantoin, macrocrystal-monohydrate, (MACROBID) 100 MG capsule, Take 1 tablet (100 MG) by mouth only after intercourse., Disp: 90 capsule, Rfl: 4   Probiotic Product (ALIGN PO), Take 1 tablet by mouth daily., Disp: , Rfl:    progesterone (PROMETRIUM) 200 MG capsule, Take 1 capsule by mouth at bedtime., Disp: , Rfl:    testosterone cypionate (DEPOTESTOSTERONE CYPIONATE) 200 MG/ML injection, Inject 18 mg into the muscle every 28 (twenty-eight) days., Disp: , Rfl:     traZODone (DESYREL) 50 MG tablet, Take 1.5 tablets (75 mg total) by mouth at bedtime., Disp: 135 tablet, Rfl: 4   valACYclovir (VALTREX) 500 MG tablet, Take 2 (two) tablets by mouth once daily for total of 5 days as needed for outbreaks., Disp: 60 tablet, Rfl: 4   Medications ordered in this encounter:  No orders of the defined types were placed in this encounter.    *If you need refills on other medications prior to your next appointment, please contact your pharmacy*  Follow-Up: Call back or seek an in-person evaluation if the symptoms worsen or if the condition fails to improve as anticipated.  [place] [phone]  Other Instructions  Please keep well-hydrated and get plenty of rest. Start a saline nasal rinse to flush out your nasal passages. You can use plain Mucinex to help thin congestion. If you have a humidifier, running in the bedroom at night. I want you to start OTC vitamin D3 1000 units daily, vitamin C 1000 mg daily, and a zinc supplement. Please take prescribed medications as directed.  You have been enrolled in a MyChart symptom monitoring program. Please answer these questions daily so we can keep track of how you are doing.  You were to quarantine for 5 days from onset of your symptoms.  After day 5, if you have had no fever and you are feeling better, you can end quarantine but need to  mask for an additional 5 days. After day 5 if you have a fever or are having significant symptoms, please quarantine for full 10 days.  If you note any worsening of symptoms, any significant shortness of breath or any chest pain, please seek ER evaluation ASAP.  Please do not delay care!  COVID-19: What to Do if You Are Sick If you test positive and are an older adult or someone who is at high risk of getting very sick from COVID-19, treatment may be available. Contact a healthcare provider right away after a positive test to determine if you are eligible, even  if your symptoms are mild right now. You can also visit a Test to Treat location and, if eligible, receive a prescription from a provider. Don't delay: Treatment must be started within the first few days to be effective. If you have a fever, cough, or other symptoms, you might have COVID-19. Most people have mild illness and are able to recover at home. If you are sick: Keep track of your symptoms. If you have an emergency warning sign (including trouble breathing), call 911. Steps to help prevent the spread of COVID-19 if you are sick If you are sick with COVID-19 or think you might have COVID-19, follow the steps below to care for yourself and to help protect other people in your home and community. Stay home except to get medical care Stay home. Most people with COVID-19 have mild illness and can recover at home without medical care. Do not leave your home, except to get medical care. Do not visit public areas and do not go to places where you are unable to wear a mask. Take care of yourself. Get rest and stay hydrated. Take over-the-counter medicines, such as acetaminophen, to help you feel better. Stay in touch with your doctor. Call before you get medical care. Be sure to get care if you have trouble breathing, or have any other emergency warning signs, or if you think it is an emergency. Avoid public transportation, ride-sharing, or taxis if possible. Get tested If you have symptoms of COVID-19, get tested. While waiting for test results, stay away from others, including staying apart from those living in your household. Get tested as soon as possible after your symptoms start. Treatments may be available for people with COVID-19 who are at risk for becoming very sick. Don't delay: Treatment must be started early to be effective--some treatments must begin within 5 days of your first symptoms. Contact your healthcare provider right away if your test result is positive to determine if you are  eligible. Self-tests are one of several options for testing for the virus that causes COVID-19 and may be more convenient than laboratory-based tests and point-of-care tests. Ask your healthcare provider or your local health department if you need help interpreting your test results. You can visit your state, tribal, local, and territorial health department's website to look for the latest local information on testing sites. Separate yourself from other people As much as possible, stay in a specific room and away from other people and pets in your home. If possible, you should use a separate bathroom. If you need to be around other people or animals in or outside of the home, wear a well-fitting mask. Tell your close contacts that they may have been exposed to COVID-19. An infected person can spread COVID-19 starting 48 hours (or 2 days) before the person has any symptoms or tests positive. By letting your close contacts know  they may have been exposed to COVID-19, you are helping to protect everyone. See COVID-19 and Animals if you have questions about pets. If you are diagnosed with COVID-19, someone from the health department may call you. Answer the call to slow the spread. Monitor your symptoms Symptoms of COVID-19 include fever, cough, or other symptoms. Follow care instructions from your healthcare provider and local health department. Your local health authorities may give instructions on checking your symptoms and reporting information. When to seek emergency medical attention Look for emergency warning signs* for COVID-19. If someone is showing any of these signs, seek emergency medical care immediately: Trouble breathing Persistent pain or pressure in the chest New confusion Inability to wake or stay awake Pale, gray, or blue-colored skin, lips, or nail beds, depending on skin tone *This list is not all possible symptoms. Please call your medical provider for any other symptoms that are  severe or concerning to you. Call 911 or call ahead to your local emergency facility: Notify the operator that you are seeking care for someone who has or may have COVID-19. Call ahead before visiting your doctor Call ahead. Many medical visits for routine care are being postponed or done by phone or telemedicine. If you have a medical appointment that cannot be postponed, call your doctor's office, and tell them you have or may have COVID-19. This will help the office protect themselves and other patients. If you are sick, wear a well-fitting mask You should wear a mask if you must be around other people or animals, including pets (even at home). Wear a mask with the best fit, protection, and comfort for you. You don't need to wear the mask if you are alone. If you can't put on a mask (because of trouble breathing, for example), cover your coughs and sneezes in some other way. Try to stay at least 6 feet away from other people. This will help protect the people around you. Masks should not be placed on young children under age 3 years, anyone who has trouble breathing, or anyone who is not able to remove the mask without help. Cover your coughs and sneezes Cover your mouth and nose with a tissue when you cough or sneeze. Throw away used tissues in a lined trash can. Immediately wash your hands with soap and water for at least 20 seconds. If soap and water are not available, clean your hands with an alcohol-based hand sanitizer that contains at least 60% alcohol. Clean your hands often Wash your hands often with soap and water for at least 20 seconds. This is especially important after blowing your nose, coughing, or sneezing; going to the bathroom; and before eating or preparing food. Use hand sanitizer if soap and water are not available. Use an alcohol-based hand sanitizer with at least 60% alcohol, covering all surfaces of your hands and rubbing them together until they feel dry. Soap and water  are the best option, especially if hands are visibly dirty. Avoid touching your eyes, nose, and mouth with unwashed hands. Handwashing Tips Avoid sharing personal household items Do not share dishes, drinking glasses, cups, eating utensils, towels, or bedding with other people in your home. Wash these items thoroughly after using them with soap and water or put in the dishwasher. Clean surfaces in your home regularly Clean and disinfect high-touch surfaces (for example, doorknobs, tables, handles, light switches, and countertops) in your "sick room" and bathroom. In shared spaces, you should clean and disinfect surfaces and items after each  use by the person who is ill. If you are sick and cannot clean, a caregiver or other person should only clean and disinfect the area around you (such as your bedroom and bathroom) on an as needed basis. Your caregiver/other person should wait as long as possible (at least several hours) and wear a mask before entering, cleaning, and disinfecting shared spaces that you use. Clean and disinfect areas that may have blood, stool, or body fluids on them. Use household cleaners and disinfectants. Clean visible dirty surfaces with household cleaners containing soap or detergent. Then, use a household disinfectant. Use a product from [employer] List N: Disinfectants for Coronavirus (RJJOA-41). Be sure to follow the instructions on the label to ensure safe and effective use of the product. Many products recommend keeping the surface wet with a disinfectant for a certain period of time (look at "contact time" on the product label). You may also need to wear personal protective equipment, such as gloves, depending on the directions on the product label. Immediately after disinfecting, wash your hands with soap and water for 20 seconds. For completed guidance on cleaning and disinfecting your home, visit Complete Disinfection Guidance. Take steps to improve ventilation at  home Improve ventilation (air flow) at home to help prevent from spreading COVID-19 to other people in your household. Clear out COVID-19 virus particles in the air by opening windows, using air filters, and turning on fans in your home. Use this interactive tool to learn how to improve air flow in your home. When you can be around others after being sick with COVID-19 Deciding when you can be around others is different for different situations. Find out when you can safely end home isolation. For any additional questions about your care, contact your healthcare provider or state or local health department. 03/12/2021 Content source: [facility] Immun[facility] Res[facility] ([facility]), Division of Viral Diseases This information is not intended to replace advice given to you by your health care provider. Make sure you discuss any questions you have with your health care provider. Document Revised: 04/25/2021 Document Reviewed: 04/25/2021 Elsevier Patient Education  2022 [employer].     If you have been instructed to have an in-person evaluation today at a local Urgent Care facility, please use the link below. It will take you to a list of all of our available [facility], including address, phone number and hours of operation. Please do not delay care.  [facility]  If you or a family member do not have a primary care provider, use the link below to schedule a visit and establish care. When you choose a [facility] primary care physician or advanced practice provider, you gain a long-term partner in health. Find a Primary Care Provider  Learn more about [facility] in-office and virtual care options: [facility]

## 2022-11-12 NOTE — Telephone Encounter (Signed)
Pt scheduled for VV with urgent care

## 2022-11-12 NOTE — Telephone Encounter (Signed)
Summary: cold and flu like symptoms / rx req   The patient has tested positive for COVID 19 via at home test today 11/12/22  The patient is currently experiencing sinus drainage and body aches  The patient would like to be prescribed something for their discomfort  Please contact further when possible       Chief Complaint: covid positive at home test today sx started this am . Requesting antiviral medication Symptoms: productive cough yellow sputum, chills, skin tenderness to cold. Scratchy throat,, tickle in throat feels like getting bronchitis. C/o HR 106.  Frequency: this am  Pertinent Negatives: Patient denies chest pain no difficulty breathing.  Disposition: _0 ED /_1 Urgent Care (no appt availability in office) / _2 Appointment(In office/virtual)/ _3  [facility]/ _4 Home Care/ _5 Refused Recommended Disposition /_6  Mobile Bus/ _7  Follow-up with PCP Additional Notes:    Recommended UC VV. No available appt.        Reason for Disposition  [1] COVID-19 diagnosed by positive lab test (e.g., PCR, rapid self-test kit) AND [2] mild symptoms (e.g., cough, fever, others) AND [0] no complications or SOB  Answer Assessment - Initial Assessment Questions 1. COVID-19 DIAGNOSIS: "How do you know that you have COVID?" (e.g., positive lab test or self-test, diagnosed by doctor or NP/PA, symptoms after exposure).     Positive at home test today  2. COVID-19 EXPOSURE: "Was there any known exposure to COVID before the symptoms began?" CDC Definition of close contact: within 6 feet (2 meters) for a total of 15 minutes or more over a 24-hour period.      Husband tested positive today too 3. ONSET: "When did the COVID-19 symptoms start?"      This am  4. WORST SYMPTOM: "What is your worst symptom?" (e.g., cough, fever, shortness of breath, muscle aches)     Cough, scratchy throat, yellow sputum. Skin tenderness, chills  5. COUGH: "Do you have a cough?" If Yes, ask: "How bad  is the cough?"       Yes productive yellow sputum 6. FEVER: "Do you have a fever?" If Yes, ask: "What is your temperature, how was it measured, and when did it start?"     Chills  7. RESPIRATORY STATUS: "Describe your breathing?" (e.g., normal; shortness of breath, wheezing, unable to speak)      Normal 8. BETTER-SAME-WORSE: "Are you getting better, staying the same or getting worse compared to yesterday?"  If getting worse, ask, "In what way?"     Tickle in throat and feels like might be getter bronchitis  9. OTHER SYMPTOMS: "Do you have any other symptoms?"  (e.g., chills, fatigue, headache, loss of smell or taste, muscle pain, sore throat)     Tickle in throat, productive cough yellow sputum, aches, heart rate 106 10. HIGH RISK DISEASE: "Do you have any chronic medical problems?" (e.g., asthma, heart or lung disease, weak immune system, obesity, etc.)       no 11. VACCINE: "Have you had the COVID-19 vaccine?" If Yes, ask: "Which one, how many shots, when did you get it?"       Yes no booster this year  12. PREGNANCY: "Is there any chance you are pregnant?" "When was your last menstrual period?"       na 13. O2 SATURATION MONITOR:  "Do you use an oxygen saturation monitor (pulse oximeter) at home?" If Yes, ask "What is your reading (oxygen level) today?" "What is your usual oxygen saturation reading?" (e.g., 95%)  na  Protocols used: Coronavirus (IRWER-15) Diagnosed or Suspected-A-AH

## 2023-01-02 ENCOUNTER — Other Ambulatory Visit: Payer: Self-pay | Admitting: Nurse Practitioner

## 2023-01-02 ENCOUNTER — Encounter: Payer: Self-pay | Admitting: Nurse Practitioner

## 2023-01-02 DIAGNOSIS — Z79890 Hormone replacement therapy (postmenopausal): Secondary | ICD-10-CM

## 2023-01-20 ENCOUNTER — Other Ambulatory Visit: Payer: Self-pay | Admitting: Nurse Practitioner

## 2023-01-20 DIAGNOSIS — Z1231 Encounter for screening mammogram for malignant neoplasm of breast: Secondary | ICD-10-CM

## 2023-04-13 ENCOUNTER — Ambulatory Visit
Admission: RE | Admit: 2023-04-13 | Discharge: 2023-04-13 | Disposition: A | Discharge status: Home / Self Care | Payer: BC Managed Care – PPO | Source: Ambulatory Visit | Attending: Nurse Practitioner | Admitting: Nurse Practitioner

## 2023-04-13 ENCOUNTER — Other Ambulatory Visit: Payer: BC Managed Care – PPO

## 2023-04-13 DIAGNOSIS — Z1231 Encounter for screening mammogram for malignant neoplasm of breast: Secondary | ICD-10-CM | POA: Insufficient documentation

## 2023-04-14 DIAGNOSIS — M8588 Other specified disorders of bone density and structure, other site: Secondary | ICD-10-CM | POA: Diagnosis not present

## 2023-04-14 NOTE — Progress Notes (Signed)
Contacted via MyChart   Normal mammogram, may repeat in one year:)

## 2023-05-07 ENCOUNTER — Encounter: Payer: Self-pay | Admitting: Nurse Practitioner

## 2023-05-07 ENCOUNTER — Telehealth (INDEPENDENT_AMBULATORY_CARE_PROVIDER_SITE_OTHER): Payer: BC Managed Care – PPO | Admitting: Nurse Practitioner

## 2023-05-07 DIAGNOSIS — R059 Cough, unspecified: Secondary | ICD-10-CM | POA: Diagnosis not present

## 2023-05-07 MED ORDER — HYDROCOD POLI-CHLORPHE POLI ER 10-8 MG/5ML PO SUER: 5.0000 mL | Freq: Two times a day (BID) | ORAL | 0 refills | Status: DC | PRN

## 2023-05-07 MED ORDER — ALBUTEROL SULFATE HFA 108 (90 BASE) MCG/ACT IN AERS: 2.0000 | INHALATION_SPRAY | Freq: Four times a day (QID) | RESPIRATORY_TRACT | 0 refills | Status: DC | PRN

## 2023-05-07 MED ORDER — PREDNISONE 20 MG PO TABS: 40.0000 mg | ORAL_TABLET | Freq: Every day | ORAL | 0 refills | Status: AC

## 2023-05-07 NOTE — Assessment & Plan Note (Signed)
Acute for 4 days.  No Covid testing done at home.  Recent URI exposure from family.  Recommend she Covid test and if positive alert provider.  At this time will not start abx due to length of symptoms <7 days, discussed at length with her guidelines on this. Will send in Prednisone 40 MG daily for 5 days, Albuterol inhaler PRN, and Tussionex to use as needed for cough.  Recommend: - Increased rest - Increasing Fluids - Acetaminophen / ibuprofen as needed for fever/pain.  - Salt water gargling, chloraseptic spray and throat lozenges - Mucinex.  - Humidifying the air.  - If by day 7 or more symptoms not improving, advised her to reach out via MyChart and alert provider, at that time will send in abx therapy.  She is in agreeance with this plan of care.  Return for worsening or ongoing symptoms.

## 2023-05-07 NOTE — Patient Instructions (Signed)

## 2023-05-07 NOTE — Progress Notes (Signed)
There were no vitals taken for this visit.   Subjective:    Patient ID: [name], female    DOB: 21-Mar-1961, 62 y.o.   MRN: [id number]  HPI: Leydi Kocian is a 62 y.o. female  Chief Complaint  Patient presents with   Cough    Started Monday with scratchy throat and nasal drainage, now chest and is hurting to cough, Has been taking Tylenol and Nyquil at night, and sudafed with Advil in the morning. Not helping very much.    This visit was completed via video visit through MyChart due to the restrictions of the COVID-19 pandemic. All issues as above were discussed and addressed. Physical exam was done as above through visual confirmation on video through MyChart. If it was felt that the patient should be evaluated in the office, they were directed there. The patient verbally consented to this visit. Location of the patient: home Location of the provider: work Those involved with this call:  Provider: Aura Dials, DNP CMA: Tristan Schroeder, CMA Front Desk/Registration: Kandice Hams  Time spent on call:  21 minutes with patient face to face via video conference. More than 50% of this time was spent in counseling and coordination of care. 15 minutes total spent in review of patient's record and preparation of their chart.  I verified patient identity using two factors (patient name and date of birth). Patient consents verbally to being seen via telemedicine visit today.    UPPER RESPIRATORY TRACT INFECTION Has had a cough and scratchy throat since Monday, then night before last started with chest congestion and cough.  Yesterday burning in chest.  Thick mucus coming up.  Has not tested Covid -- her grandchildren and daughter tested negative for this and RSV/Flu. Fever: probably because gets achy and skin sensitive Cough: yes Shortness of breath: no Wheezing: no Chest pain: burning sensation Chest tightness: yes Chest congestion: yes Nasal congestion: no Runny nose: yes Post nasal  drip: yes Sneezing: no Sore throat: no Swollen glands: no Sinus pressure: no Headache: with fever Face pain: no Toothache: no Ear pain: ears are aching Ear pressure: none Eyes red/itching:no Eye drainage/crusting: no  Vomiting: no Rash: no Fatigue: yes Sick contacts: yes -- her grandchildren and daughter Strep contacts: no  Context: stable Recurrent sinusitis: no Relief with OTC cold/cough medications: yes  Treatments attempted: Nyquil, Tylenol, Advil, Sudafed    Relevant past medical, surgical, family and social history reviewed and updated as indicated. Interim medical history since our last visit reviewed. Allergies and medications reviewed and updated.  Review of Systems  Constitutional:  Positive for chills and fatigue. Negative for activity change, appetite change and fever (suspected).  HENT:  Positive for congestion, postnasal drip and rhinorrhea. Negative for ear discharge, ear pain, facial swelling, sinus pressure, sinus pain, sneezing, sore throat and voice change.   Eyes:  Negative for pain and visual disturbance.  Respiratory:  Positive for cough and chest tightness. Negative for shortness of breath and wheezing.   Cardiovascular:  Negative for chest pain, palpitations and leg swelling.  Gastrointestinal: Negative.   Endocrine: Negative.   Musculoskeletal:  Positive for myalgias.  Neurological: Negative.   Psychiatric/Behavioral: Negative.      Per HPI unless specifically indicated above     Objective:    There were no vitals taken for this visit.  Wt Readings from Last 3 Encounters:  08/01/22 148 lb 9.6 oz (67.4 kg)  09/24/21 136 lb (61.7 kg)  09/11/21 140 lb 8 oz (63.7 kg)  Physical Exam Vitals and nursing note reviewed.  Constitutional:      General: She is awake. She is not in acute distress.    Appearance: She is well-developed and well-groomed. She is ill-appearing. She is not toxic-appearing.  HENT:     Head: Normocephalic.     Right Ear:  Hearing normal.     Left Ear: Hearing normal.  Eyes:     General: Lids are normal.        Right eye: No discharge.        Left eye: No discharge.     Conjunctiva/sclera: Conjunctivae normal.  Pulmonary:     Effort: Pulmonary effort is normal. No accessory muscle usage or respiratory distress.  Musculoskeletal:     Cervical back: Normal range of motion.  Neurological:     Mental Status: She is alert and oriented to person, place, and time.  Psychiatric:        Attention and Perception: Attention normal.        Mood and Affect: Mood normal.        Behavior: Behavior normal. Behavior is cooperative.        Thought Content: Thought content normal.        Judgment: Judgment normal.    Results for orders placed or performed in visit on 08/26/22  HM DEXA SCAN  Result Value Ref Range   HM Dexa Scan see report in chart       Assessment & Plan:   Problem List Items Addressed This Visit       Other   Cough in adult - Primary    Acute for 4 days.  No Covid testing done at home.  Recent URI exposure from family.  Recommend she Covid test and if positive alert provider.  At this time will not start abx due to length of symptoms <7 days, discussed at length with her guidelines on this. Will send in Prednisone 40 MG daily for 5 days, Albuterol inhaler PRN, and Tussionex to use as needed for cough.  Recommend: - Increased rest - Increasing Fluids - Acetaminophen / ibuprofen as needed for fever/pain.  - Salt water gargling, chloraseptic spray and throat lozenges - Mucinex.  - Humidifying the air.  - If by day 7 or more symptoms not improving, advised her to reach out via MyChart and alert provider, at that time will send in abx therapy.  She is in agreeance with this plan of care.  Return for worsening or ongoing symptoms.       I discussed the assessment and treatment plan with the patient. The patient was provided an opportunity to ask questions and all were answered. The patient agreed  with the plan and demonstrated an understanding of the instructions.   The patient was advised to call back or seek an in-person evaluation if the symptoms worsen or if the condition fails to improve as anticipated.   I provided 21+ minutes of time during this encounter.    Follow up plan: Return if symptoms worsen or fail to improve.

## 2023-05-11 ENCOUNTER — Telehealth: Payer: BC Managed Care – PPO | Admitting: Family Medicine

## 2023-05-11 ENCOUNTER — Other Ambulatory Visit: Payer: Self-pay

## 2023-05-11 ENCOUNTER — Encounter: Payer: Self-pay | Admitting: Nurse Practitioner

## 2023-05-11 DIAGNOSIS — R059 Cough, unspecified: Secondary | ICD-10-CM

## 2023-05-11 MED ORDER — AMOXICILLIN-POT CLAVULANATE 875-125 MG PO TABS
1.0000 | ORAL_TABLET | Freq: Two times a day (BID) | ORAL | 0 refills | Status: AC
  Filled 2023-05-11: qty 14, 7d supply, fill #0

## 2023-05-11 NOTE — Progress Notes (Signed)
[name]   Will contact her PCP due to recent appt regarding similar and on going symptoms, PCP made note she would follow up with her if she did not improve.  Patient acknowledged agreement and understanding of the plan.

## 2023-05-15 ENCOUNTER — Telehealth: Payer: BC Managed Care – PPO | Admitting: Nurse Practitioner

## 2023-05-15 DIAGNOSIS — J40 Bronchitis, not specified as acute or chronic: Secondary | ICD-10-CM | POA: Diagnosis not present

## 2023-05-15 MED ORDER — AZITHROMYCIN 250 MG PO TABS
ORAL_TABLET | ORAL | 0 refills | Status: AC
Start: 2023-05-15 — End: 2023-05-20

## 2023-05-15 MED ORDER — PROMETHAZINE-DM 6.25-15 MG/5ML PO SYRP
5.0000 mL | ORAL_SOLUTION | Freq: Four times a day (QID) | ORAL | 0 refills | Status: DC | PRN
Start: 2023-05-15 — End: 2023-08-03

## 2023-05-15 NOTE — Progress Notes (Signed)
Virtual Visit Consent   [name], you are scheduled for a virtual visit with a [facility] provider today. Just as with appointments in the office, your consent must be obtained to participate. Your consent will be active for this visit and any virtual visit you may have with one of our providers in the next 365 days. If you have a MyChart account, a copy of this consent can be sent to you electronically.  As this is a virtual visit, video technology does not allow for your provider to perform a traditional examination. This may limit your provider's ability to fully assess your condition. If your provider identifies any concerns that need to be evaluated in person or the need to arrange testing (such as labs, EKG, etc.), we will make arrangements to do so. Although advances in technology are sophisticated, we cannot ensure that it will always work on either your end or our end. If the connection with a video visit is poor, the visit may have to be switched to a telephone visit. With either a video or telephone visit, we are not always able to ensure that we have a secure connection.  By engaging in this virtual visit, you consent to the provision of healthcare and authorize for your insurance to be billed (if applicable) for the services provided during this visit. Depending on your insurance coverage, you may receive a charge related to this service.  I need to obtain your verbal consent now. Are you willing to proceed with your visit today? [name] has provided verbal consent on 05/15/2023 for a virtual visit (video or telephone). Emma Simas, FNP  Date: 05/15/2023 2:50 PM  Virtual Visit via Video Note   I, [name], connected with  [name]  ([id number], [birth year]) on 05/15/23 at  3:00 PM EDT by a video-enabled telemedicine application and verified that I am speaking with the correct person using two identifiers.  Location: Patient: Virtual Visit Location Patient:  Home Provider: Virtual Visit Location Provider: Home Office   I discussed the limitations of evaluation and management by telemedicine and the availability of in person appointments. The patient expressed understanding and agreed to proceed.    History of Present Illness: [name] is a 62 y.o. who identifies as a female who was assigned female at birth, and is being seen today for a cough.  Started Augmentin 05/11/23 Has also been on prednisone   Her cough sounds like mucous is moving though the cough is not productive   During the day she is managing the cough OK but she is unable to sleep   She has been trying Tussionex without relief   She did have promethazine with better relief in the past  Denies any sinus congestion involved with her cough   She has also been giving her Albuterol inhaler   Allergy to pertussis vaccine-  She has had some immunizations in the past but her adult dosage of TDAp caused reaction   Problems:  Patient Active Problem List   Diagnosis Date Noted   Cough in adult 01/15/2022   Complete tear of right rotator cuff    Degenerative superior labral anterior-to-posterior (SLAP) tear of right shoulder    Healthcare maintenance 07/31/2021   Congenital pes cavus 02/25/2021   History of uterine fibroid 01/05/2021   Herpes 07/12/2020   GERD (gastroesophageal reflux disease) 05/23/2020   Vaginal atrophy 05/23/2020   Cervical spine arthritis 05/23/2020   Osteopenia 05/23/2020   Insomnia 05/20/2020   Postmenopausal hormone  therapy 05/20/2020    Allergies:  Allergies  Allergen Reactions   Cyclobenzaprine     Altered mental state    Pertussis Vaccines Swelling   Medications:  Current Outpatient Medications:    albuterol (VENTOLIN HFA) 108 (90 Base) MCG/ACT inhaler, Inhale 2 puffs into the lungs every 6 (six) hours as needed for wheezing or shortness of breath., Disp: 18 g, Rfl: 0   amoxicillin-clavulanate (AUGMENTIN) 875-125 MG tablet, Take 1  tablet by mouth 2 (two) times daily for 7 days., Disp: 14 tablet, Rfl: 0   Biotin 5 MG CAPS, Take 5 mg by mouth daily., Disp: , Rfl:    CALCIUM PO, Take 800 mg by mouth daily., Disp: , Rfl:    chlorpheniramine-HYDROcodone (TUSSIONEX) 10-8 MG/5ML, Take 5 mLs by mouth every 12 (twelve) hours as needed for cough., Disp: 115 mL, Rfl: 0   Cholecalciferol 50 MCG (2000 UT) CAPS, Take 2,000 Units by mouth daily., Disp: , Rfl:    esomeprazole (NEXIUM) 20 MG capsule, Take 20 mg by mouth daily., Disp: , Rfl:    Estradiol (YUVAFEM) 10 MCG TABS vaginal tablet, Place 1 tablet (10 mcg total) vaginally twice a week, Disp: 18 tablet, Rfl: 0   fluticasone (FLONASE) 50 MCG/ACT nasal spray, Place 2 sprays into both nostrils daily., Disp: 16 g, Rfl: 0   Melatonin 5 MG CAPS, Take 5 mg by mouth at bedtime., Disp: , Rfl:    methocarbamol (ROBAXIN) 500 MG tablet, Take 1 tablet (500 mg total) by mouth every 8 (eight) hours as needed for muscle spasms., Disp: 30 tablet, Rfl: 1   Probiotic Product (ALIGN PO), Take 1 tablet by mouth daily., Disp: , Rfl:    progesterone (PROMETRIUM) 200 MG capsule, Take 1 capsule by mouth at bedtime., Disp: , Rfl:    testosterone (ANDROGEL) 50 MG/5GM (1%) GEL, Apply 0.5cc daily in syringe to upper outer or inner thigh, at least an hour after shaving., Disp: , Rfl:    traZODone (DESYREL) 50 MG tablet, Take 1.5 tablets (75 mg total) by mouth at bedtime., Disp: 135 tablet, Rfl: 4   valACYclovir (VALTREX) 500 MG tablet, Take 2 (two) tablets by mouth once daily for total of 5 days as needed for outbreaks., Disp: 60 tablet, Rfl: 4  Observations/Objective: Patient is well-developed, well-nourished in no acute distress.  Resting comfortably  at home.  Head is normocephalic, atraumatic.  No labored breathing.  Speech is clear and coherent with logical content.  Patient is alert and oriented at baseline.    Assessment and Plan:  1. Bronchitis Stop Tussinex Finish Augmentin tomorrow morning  then start Azithromycin  Concern for pertussis given under immunized status vs CAP  - promethazine-dextromethorphan (PROMETHAZINE-DM) 6.25-15 MG/5ML syrup; Take 5 mLs by mouth 4 (four) times daily as needed for cough.  Dispense: 118 mL; Refill: 0  - azithromycin (ZITHROMAX) 250 MG tablet; Take 2 tablets on day 1, then 1 tablet daily on days 2 through 5  Dispense: 6 tablet; Refill: 0     Follow Up Instructions: I discussed the assessment and treatment plan with the patient. The patient was provided an opportunity to ask questions and all were answered. The patient agreed with the plan and demonstrated an understanding of the instructions.  A copy of instructions were sent to the patient via MyChart unless otherwise noted below.    The patient was advised to call back or seek an in-person evaluation if the symptoms worsen or if the condition fails to improve as anticipated.  Time:  I spent 15 minutes with the patient via telehealth technology discussing the above problems/concerns.    Emma Simas, FNP

## 2023-06-01 DIAGNOSIS — R5383 Other fatigue: Secondary | ICD-10-CM | POA: Diagnosis not present

## 2023-06-26 ENCOUNTER — Encounter: Payer: Self-pay | Admitting: Nurse Practitioner

## 2023-07-13 ENCOUNTER — Ambulatory Visit: Payer: BC Managed Care – PPO | Admitting: Nurse Practitioner

## 2023-07-30 ENCOUNTER — Other Ambulatory Visit: Payer: Self-pay | Admitting: Nurse Practitioner

## 2023-07-31 NOTE — Telephone Encounter (Signed)
Requested Prescriptions  Pending Prescriptions Disp Refills   albuterol (VENTOLIN HFA) 108 (90 Base) MCG/ACT inhaler [Pharmacy Med Name: ALBUTEROL HFA (PROVENTIL) INH] 6.7 each 0    Sig: TAKE 2 PUFFS BY MOUTH EVERY 6 HOURS AS NEEDED FOR WHEEZE OR SHORTNESS OF BREATH     Pulmonology:  Beta Agonists 2 Passed - 07/30/2023  2:39 AM      Passed - Last BP in normal range    BP Readings from Last 1 Encounters:  08/01/22 90/62         Passed - Last Heart Rate in normal range    Pulse Readings from Last 1 Encounters:  08/01/22 79         Passed - Valid encounter within last 12 months    Recent Outpatient Visits           2 months ago Cough in adult   [facility] [place], [place] Anne T, NP   12 months ago Osteopenia of lumbar spine   [place] [place], Corrie Dandy T, NP   1 year ago Acute cough   [facility] [place], Corrie Dandy T, NP   2 years ago Primary insomnia   [facility] [place], Boyd T, NP   2 years ago Primary insomnia   [facility] [place], Corrie Dandy T, NP       Future Appointments             In 3 days Marjie Skiff, NP [facility]   In 6 months Willeen Niece, MD [facility]

## 2023-08-01 DIAGNOSIS — E7800 Pure hypercholesterolemia, unspecified: Secondary | ICD-10-CM | POA: Insufficient documentation

## 2023-08-01 NOTE — Patient Instructions (Signed)
Be Involved in Caring For Your Health:  Taking Medications When medications are taken as directed, they can greatly improve your health. But if they are not taken as prescribed, they may not work. In some cases, not taking them correctly can be harmful. To help ensure your treatment remains effective and safe, understand your medications and how to take them. Bring your medications to each visit for review by your provider.  Your lab results, notes, and after visit summary will be available on My Chart. We strongly encourage you to use this feature. If lab results are abnormal the clinic will contact you with the appropriate steps. If the clinic does not contact you assume the results are satisfactory. You can always view your results on My Chart. If you have questions regarding your health or results, please contact the clinic during office hours. You can also ask questions on My Chart.  We at [facility] are grateful that you chose [place] to provide your care. We strive to provide evidence-based and compassionate care and are always looking for feedback. If you get a survey from the clinic please complete this so we can hear your opinions.  Healthy Eating, Adult Healthy eating may help you get and keep a healthy body weight, reduce the risk of chronic disease, and live a long and productive life. It is important to follow a healthy eating pattern. Your nutritional and calorie needs should be met mainly by different nutrient-rich foods. What are tips for following this plan? Reading food labels Read labels and choose the following: Reduced or low sodium products. Juices with 100% fruit juice. Foods with low saturated fats (<3 g per serving) and high polyunsaturated and monounsaturated fats. Foods with whole grains, such as whole wheat, cracked wheat, brown rice, and wild rice. Whole grains that are fortified with folic acid. This is recommended for females who are pregnant or who want to  become pregnant. Read labels and do not eat or drink the following: Foods or drinks with added sugars. These include foods that contain brown sugar, corn sweetener, corn syrup, dextrose, fructose, glucose, high-fructose corn syrup, honey, invert sugar, lactose, malt syrup, maltose, molasses, raw sugar, sucrose, trehalose, or turbinado sugar. Limit your intake of added sugars to less than 10% of your total daily calories. Do not eat more than the following amounts of added sugar per day: 6 teaspoons (25 g) for females. 9 teaspoons (38 g) for males. Foods that contain processed or refined starches and grains. Refined grain products, such as white flour, degermed cornmeal, white bread, and white rice. Shopping Choose nutrient-rich snacks, such as vegetables, whole fruits, and nuts. Avoid high-calorie and high-sugar snacks, such as potato chips, fruit snacks, and candy. Use oil-based dressings and spreads on foods instead of solid fats such as butter, margarine, sour cream, or cream cheese. Limit pre-made sauces, mixes, and "instant" products such as flavored rice, instant noodles, and ready-made pasta. Try more plant-protein sources, such as tofu, tempeh, black beans, edamame, lentils, nuts, and seeds. Explore eating plans such as the Mediterranean diet or vegetarian diet. Try heart-healthy dips made with beans and healthy fats like hummus and guacamole. Vegetables go great with these. Cooking Use oil to saut or stir-fry foods instead of solid fats such as butter, margarine, or lard. Try baking, boiling, grilling, or broiling instead of frying. Remove the fatty part of meats before cooking. Steam vegetables in water or broth. Meal planning  At meals, imagine dividing your plate into fourths: One-half of  your plate is fruits and vegetables. One-fourth of your plate is whole grains. One-fourth of your plate is protein, especially lean meats, poultry, eggs, tofu, beans, or nuts. Include low-fat  dairy as part of your daily diet. Lifestyle Choose healthy options in all settings, including home, work, school, restaurants, or stores. Prepare your food safely: Wash your hands after handling raw meats. Where you prepare food, keep surfaces clean by regularly washing with hot, soapy water. Keep raw meats separate from ready-to-eat foods, such as fruits and vegetables. Cook seafood, meat, poultry, and eggs to the recommended temperature. Get a food thermometer. Store foods at safe temperatures. In general: Keep cold foods at 48F (4.4C) or below. Keep hot foods at 148F (60C) or above. Keep your freezer at [facility] (-17.8C) or below. Foods are not safe to eat if they have been between the temperatures of 40-148F (4.4-60C) for more than 2 hours. What foods should I eat? Fruits Aim to eat 1-2 cups of fresh, canned (in natural juice), or frozen fruits each day. One cup of fruit equals 1 small apple, 1 large banana, 8 large strawberries, 1 cup (237 g) canned fruit,  cup (82 g) dried fruit, or 1 cup (240 mL) 100% juice. Vegetables Aim to eat 2-4 cups of fresh and frozen vegetables each day, including different varieties and colors. One cup of vegetables equals 1 cup (91 g) broccoli or cauliflower florets, 2 medium carrots, 2 cups (150 g) raw, leafy greens, 1 large tomato, 1 large bell pepper, 1 large sweet potato, or 1 medium white potato. Grains Aim to eat 5-10 ounce-equivalents of whole grains each day. Examples of 1 ounce-equivalent of grains include 1 slice of bread, 1 cup (40 g) ready-to-eat cereal, 3 cups (24 g) popcorn, or  cup (93 g) cooked rice. Meats and other proteins Try to eat 5-7 ounce-equivalents of protein each day. Examples of 1 ounce-equivalent of protein include 1 egg,  oz nuts (12 almonds, 24 pistachios, or 7 walnut halves), 1/4 cup (90 g) cooked beans, 6 tablespoons (90 g) hummus or 1 tablespoon (16 g) peanut butter. A cut of meat or fish that is the size of a deck of  cards is about 3-4 ounce-equivalents (85 g). Of the protein you eat each week, try to have at least 8 sounce (227 g) of seafood. This is about 2 servings per week. This includes salmon, trout, herring, sardines, and anchovies. Dairy Aim to eat 3 cup-equivalents of fat-free or low-fat dairy each day. Examples of 1 cup-equivalent of dairy include 1 cup (240 mL) milk, 8 ounces (250 g) yogurt, 1 ounces (44 g) natural cheese, or 1 cup (240 mL) fortified soy milk. Fats and oils Aim for about 5 teaspoons (21 g) of fats and oils per day. Choose monounsaturated fats, such as canola and olive oils, mayonnaise made with olive oil or avocado oil, avocados, peanut butter, and most nuts, or polyunsaturated fats, such as sunflower, corn, and soybean oils, walnuts, pine nuts, sesame seeds, sunflower seeds, and flaxseed. Beverages Aim for 6 eight-ounce glasses of water per day. Limit coffee to 3-5 eight-ounce cups per day. Limit caffeinated beverages that have added calories, such as soda and energy drinks. If you drink alcohol: Limit how much you have to: 0-1 drink a day if you are female. 0-2 drinks a day if you are female. Know how much alcohol is in your drink. In the [place] one drink is one 12 oz bottle of beer (355 mL), one 5 oz glass of wine (  148 mL), or one 1 oz glass of hard liquor (44 mL). Seasoning and other foods Try not to add too much salt to your food. Try using herbs and spices instead of salt. Try not to add sugar to food. This information is based on [place] nutrition guidelines. To learn more, visit [employer]. Exact amounts may vary. You may need different amounts. This information is not intended to replace advice given to you by your health care provider. Make sure you discuss any questions you have with your health care provider. Document Revised: 09/08/2022 Document Reviewed: 09/08/2022 Elsevier Patient Education  2024 [employer].

## 2023-08-03 ENCOUNTER — Ambulatory Visit (INDEPENDENT_AMBULATORY_CARE_PROVIDER_SITE_OTHER): Payer: BC Managed Care – PPO | Admitting: Nurse Practitioner

## 2023-08-03 ENCOUNTER — Encounter: Payer: Self-pay | Admitting: Nurse Practitioner

## 2023-08-03 VITALS — BP 104/64 | HR 79 | Temp 97.8°F | Ht 62.6 in | Wt 150.6 lb

## 2023-08-03 DIAGNOSIS — Z0000 Encounter for general adult medical examination without abnormal findings: Secondary | ICD-10-CM | POA: Diagnosis not present

## 2023-08-03 DIAGNOSIS — K219 Gastro-esophageal reflux disease without esophagitis: Secondary | ICD-10-CM

## 2023-08-03 DIAGNOSIS — F5101 Primary insomnia: Secondary | ICD-10-CM

## 2023-08-03 DIAGNOSIS — N952 Postmenopausal atrophic vaginitis: Secondary | ICD-10-CM

## 2023-08-03 DIAGNOSIS — Z79890 Hormone replacement therapy (postmenopausal): Secondary | ICD-10-CM

## 2023-08-03 DIAGNOSIS — E7800 Pure hypercholesterolemia, unspecified: Secondary | ICD-10-CM | POA: Diagnosis not present

## 2023-08-03 DIAGNOSIS — M8588 Other specified disorders of bone density and structure, other site: Secondary | ICD-10-CM

## 2023-08-03 MED ORDER — TRAZODONE HCL 50 MG PO TABS: 100.0000 mg | ORAL_TABLET | Freq: Every day | ORAL | 4 refills | Status: DC

## 2023-08-03 NOTE — Assessment & Plan Note (Addendum)
Noted on past labs.  Recheck today. Continue diet and exercise focus at home. The 10-year ASCVD risk score (Arnett DK, et al., 2019) is: 2.4%   Values used to calculate the score:     Age: 62 years     Sex: Female     Is Non-Hispanic African American: No     Diabetic: No     Tobacco smoker: No     Systolic Blood Pressure: 104 mmHg     Is BP treated: No     HDL Cholesterol: 62 mg/dL     Total Cholesterol: 179 mg/dL

## 2023-08-03 NOTE — Assessment & Plan Note (Signed)
Ongoing, noted on DEXA. Continue daily Vitamin D3 and Calcium supplements.  Repeat DEXA in 2 years (04/13/25), family history and hormone treatment.  Vit D level today.

## 2023-08-03 NOTE — Assessment & Plan Note (Signed)
Chronic, stable.  Continue current medication regimen, Trazodone and Melatonin, will increase Trazodone so she can take 75-100 MG as needed.  Continue focus on good sleep hygiene techniques at home.

## 2023-08-03 NOTE — Progress Notes (Signed)
BP 104/64   Pulse 79   Temp 97.8 F (36.6 C) (Oral)   Ht 5' 2.6" (1.59 m)   Wt 150 lb 9.6 oz (68.3 kg)   SpO2 96%   BMI 27.02 kg/m    Subjective:    Patient ID: [name], female    DOB: 02/09/1961, 62 y.o.   MRN: [id number]  HPI: [name] is a 62 y.o. female presenting on 08/03/2023 for comprehensive medical examination. Current medical complaints include:none  She currently lives with: husband Menopausal Symptoms: no   Followed by GYN for menopausal symptoms and last saw 08/19/22.  She receives Testosterone injections at their office and continues on Progesterone and Yuvafem.  On current regimen she is able to function well. Continues on Macrobid after intercourse as preventative.   INSOMNIA Continues on Trazodone for sleep. She does have periods where does not sleep as well. Duration: chronic Satisfied with sleep quality: yes Difficulty falling asleep: at times -- fight or flight feeling, thinking a lot Difficulty staying asleep: no Waking a few hours after sleep onset: no Early morning awakenings: no Daytime hypersomnolence: no Wakes feeling refreshed: no Good sleep hygiene: no Apnea: no Snoring: yes Depressed/anxious mood: no Recent stress: no Restless legs/nocturnal leg cramps: no Chronic pain/arthritis: no History of sleep study: no Treatments attempted: Trazodone and Ambien    GERD Continues on Nexium 20 MG daily. If does not take gets heart burn. GERD control status: stable  Satisfied with current treatment? yes Heartburn frequency: no Medication side effects: no  Medication compliance: stable Dysphagia: no Odynophagia:  no Hematemesis: no Blood in stool: no EGD: yes  The 10-year ASCVD risk score (Arnett DK, et al., 2019) is: 2.4%   Values used to calculate the score:     Age: 15 years     Sex: Female     Is Non-Hispanic African American: No     Diabetic: No     Tobacco smoker: No     Systolic Blood Pressure: 104 mmHg     Is BP treated:  No     HDL Cholesterol: 62 mg/dL     Total Cholesterol: 179 mg/dL  Depression Screen done today and results listed below:     08/03/2023    9:12 AM 08/01/2022   10:22 AM 07/31/2021    9:11 AM 01/22/2021   10:31 AM 07/12/2020   10:51 AM  Depression screen PHQ 2/9  Decreased Interest 0 0 0 0 0  Down, Depressed, Hopeless 0 0 0 0 0  PHQ - 2 Score 0 0 0 0 0  Altered sleeping 1 1     Tired, decreased energy 0 0     Change in appetite 0 0     Feeling bad or failure about yourself  0 0     Trouble concentrating 0 0     Moving slowly or fidgety/restless 0 0     Suicidal thoughts 0 0     PHQ-9 Score 1 1     Difficult doing work/chores Somewhat difficult Not difficult at all         08/03/2023    9:12 AM 08/01/2022   10:22 AM  GAD 7 : Generalized Anxiety Score  Nervous, Anxious, on Edge 0 0  Control/stop worrying 0 0  Worry too much - different things 0 0  Trouble relaxing 0 0  Restless 0 0  Easily annoyed or irritable 0 0  Afraid - awful might happen 0 0  Total GAD 7 Score  0 0  Anxiety Difficulty Not difficult at all Not difficult at all      09/24/2021    9:46 AM 08/01/2022   10:22 AM 08/01/2022   10:27 AM 08/03/2023    9:10 AM 08/03/2023    9:12 AM  Fall Risk  Falls in the past year?  0 0 0 0  Was there an injury with Fall?  0 0 0 0  Fall Risk Category Calculator  0 0 0 0  Fall Risk Category (Retired)  Low Low    (RETIRED) Patient Fall Risk Level Moderate fall risk Low fall risk Low fall risk    Patient at Risk for Falls Due to  No Fall Risks No Fall Risks No Fall Risks No Fall Risks  Fall risk Follow up  Falls evaluation completed Falls evaluation completed Falls evaluation completed Falls evaluation completed    Functional Status Survey: Is the patient deaf or have difficulty hearing?: No Does the patient have difficulty seeing, even when wearing glasses/contacts?: No Does the patient have difficulty concentrating, remembering, or making decisions?: No Does the patient have  difficulty walking or climbing stairs?: No Does the patient have difficulty dressing or bathing?: No Does the patient have difficulty doing errands alone such as visiting a doctor's office or shopping?: No   Past Medical History:  Past Medical History:  Diagnosis Date   Complication of anesthesia    PONV (postoperative nausea and vomiting)     Surgical History:  Past Surgical History:  Procedure Laterality Date   AUGMENTATION MAMMAPLASTY Bilateral 2010   BICEPT TENODESIS Right 09/24/2021   Procedure: BICEPS TENODESIS;  Surgeon: Cammy Copa, MD;  Location: [facility] OR;  Service: Orthopedics;  Laterality: Right;   CERVICAL SPINE SURGERY  2003   C5-C7- double dissectomy   PAROTID GLAND TUMOR EXCISION     SHOULDER ARTHROSCOPY Right 09/24/2021   Procedure: right shoulder arthroscopy, debridement,open rotator cuff tear repair;  Surgeon: Cammy Copa, MD;  Location: [facility] OR;  Service: Orthopedics;  Laterality: Right;   WISDOM TOOTH EXTRACTION Bilateral     Medications:  Current Outpatient Medications on File Prior to Visit  Medication Sig   Biotin 5 MG CAPS Take 5 mg by mouth daily.   CALCIUM PO Take 800 mg by mouth daily.   Cholecalciferol 50 MCG (2000 UT) CAPS Take 2,000 Units by mouth daily.   esomeprazole (NEXIUM) 20 MG capsule Take 20 mg by mouth daily.   Estradiol (YUVAFEM) 10 MCG TABS vaginal tablet Place 1 tablet (10 mcg total) vaginally twice a week   fluticasone (FLONASE) 50 MCG/ACT nasal spray Place 2 sprays into both nostrils daily.   Melatonin 5 MG CAPS Take 5 mg by mouth at bedtime.   methocarbamol (ROBAXIN) 500 MG tablet Take 1 tablet (500 mg total) by mouth every 8 (eight) hours as needed for muscle spasms.   Probiotic Product (ALIGN PO) Take 1 tablet by mouth daily.   progesterone (PROMETRIUM) 200 MG capsule Take 1 capsule by mouth at bedtime.   testosterone (ANDROGEL) 50 MG/5GM (1%) GEL Apply 0.5cc daily in syringe to upper outer or inner thigh, at least an hour  after shaving.   valACYclovir (VALTREX) 500 MG tablet Take 2 (two) tablets by mouth once daily for total of 5 days as needed for outbreaks.   No current facility-administered medications on file prior to visit.    Allergies:  Allergies  Allergen Reactions   Cyclobenzaprine     Altered mental state    Pertussis  Vaccines Swelling    Social History:  Social History   Socioeconomic History   Marital status: Married    Spouse name: Not on file   Number of children: Not on file   Years of education: Not on file   Highest education level: Not on file  Occupational History   Not on file  Tobacco Use   Smoking status: Never   Smokeless tobacco: Never  Vaping Use   Vaping status: Never Used  Substance and Sexual Activity   Alcohol use: Yes    Comment: ocassional   Drug use: Never   Sexual activity: Yes  Other Topics Concern   Not on file  Social History Narrative   Not on file   Social Determinants of Health   Financial Resource Strain: Low Risk  (05/23/2020)   Overall Financial Resource Strain (CARDIA)    Difficulty of Paying Living Expenses: Not hard at all  Food Insecurity: No Food Insecurity (05/23/2020)   Hunger Vital Sign    Worried About Running Out of Food in the Last Year: Never true    Ran Out of Food in the Last Year: Never true  Transportation Needs: No Transportation Needs (05/23/2020)   PRAPARE - Administrator, Civil Service (Medical): No    Lack of Transportation (Non-Medical): No  Physical Activity: Insufficiently Active (05/23/2020)   Exercise Vital Sign    Days of Exercise per Week: 3 days    Minutes of Exercise per Session: 30 min  Stress: Stress Concern Present (05/23/2020)   [employer] of Occupational Health - Occupational Stress Questionnaire    Feeling of Stress : Rather much  Social Connections: Moderately Isolated (05/23/2020)   Social Connection and Isolation Panel [NHANES]    Frequency of Communication with Friends and Family:  Three times a week    Frequency of Social Gatherings with Friends and Family: Three times a week    Attends Religious Services: Never    Active Member of Clubs or Organizations: No    Attends Engineer, structural: Never    Marital Status: Married  Catering manager Violence: Not on file   Social History   Tobacco Use  Smoking Status Never  Smokeless Tobacco Never   Social History   Substance and Sexual Activity  Alcohol Use Yes   Comment: ocassional    Family History:  Family History  Problem Relation Age of Onset   Lung cancer Mother    Thyroid disease Mother    Hypertension Mother    Hyperlipidemia Mother    CAD Mother        stent x 1   Osteoporosis Mother    Leukemia Father 38   Rheum arthritis Sister    Melanoma Sister    Osteopenia Sister    Autoimmune disease Daughter    Colon cancer Maternal Uncle    Heart disease Maternal Grandmother    Colon cancer Maternal Grandfather    Heart disease Maternal Grandfather    Stroke Maternal Grandfather    Heart disease Paternal Grandfather     Past medical history, surgical history, medications, allergies, family history and social history reviewed with patient today and changes made to appropriate areas of the chart.   Review of Systems - negative All other ROS negative except what is listed above and in the HPI.      Objective:    BP 104/64   Pulse 79   Temp 97.8 F (36.6 C) (Oral)   Ht 5' 2.6" (1.59  m)   Wt 150 lb 9.6 oz (68.3 kg)   SpO2 96%   BMI 27.02 kg/m   Wt Readings from Last 3 Encounters:  08/03/23 150 lb 9.6 oz (68.3 kg)  08/01/22 148 lb 9.6 oz (67.4 kg)  09/24/21 136 lb (61.7 kg)    Physical Exam Vitals and nursing note reviewed. Exam conducted with a chaperone present.  Constitutional:      General: She is awake. She is not in acute distress.    Appearance: She is well-developed and well-groomed. She is not ill-appearing or toxic-appearing.  HENT:     Head: Normocephalic and  atraumatic.     Right Ear: Hearing, tympanic membrane, ear canal and external ear normal. No drainage.     Left Ear: Hearing, tympanic membrane, ear canal and external ear normal. No drainage.     Nose: Nose normal.     Right Sinus: No maxillary sinus tenderness or frontal sinus tenderness.     Left Sinus: No maxillary sinus tenderness or frontal sinus tenderness.     Mouth/Throat:     Mouth: Mucous membranes are moist.     Pharynx: Oropharynx is clear. Uvula midline. No pharyngeal swelling, oropharyngeal exudate or posterior oropharyngeal erythema.  Eyes:     General: Lids are normal.        Right eye: No discharge.        Left eye: No discharge.     Extraocular Movements: Extraocular movements intact.     Conjunctiva/sclera: Conjunctivae normal.     Pupils: Pupils are equal, round, and reactive to light.     Visual Fields: Right eye visual fields normal and left eye visual fields normal.  Neck:     Thyroid: No thyromegaly.     Vascular: No carotid bruit.     Trachea: Trachea normal.  Cardiovascular:     Rate and Rhythm: Normal rate and regular rhythm.     Heart sounds: Normal heart sounds. No murmur heard.    No gallop.  Pulmonary:     Effort: Pulmonary effort is normal. No accessory muscle usage or respiratory distress.     Breath sounds: Normal breath sounds.  Chest:  Breasts:    Right: Normal.     Left: Normal.  Abdominal:     General: Bowel sounds are normal.     Palpations: Abdomen is soft. There is no hepatomegaly or splenomegaly.     Tenderness: There is no abdominal tenderness.  Musculoskeletal:        General: Normal range of motion.     Cervical back: Normal range of motion and neck supple.     Right lower leg: No edema.     Left lower leg: No edema.  Lymphadenopathy:     Head:     Right side of head: No submental, submandibular, tonsillar, preauricular or posterior auricular adenopathy.     Left side of head: No submental, submandibular, tonsillar,  preauricular or posterior auricular adenopathy.     Cervical: No cervical adenopathy.     Upper Body:     Right upper body: No supraclavicular, axillary or pectoral adenopathy.     Left upper body: No supraclavicular, axillary or pectoral adenopathy.  Skin:    General: Skin is warm and dry.     Capillary Refill: Capillary refill takes less than 2 seconds.     Findings: No rash.  Neurological:     Mental Status: She is alert and oriented to person, place, and time.  Gait: Gait is intact.     Deep Tendon Reflexes: Reflexes are normal and symmetric.     Reflex Scores:      Brachioradialis reflexes are 2+ on the right side and 2+ on the left side.      Patellar reflexes are 2+ on the right side and 2+ on the left side. Psychiatric:        Attention and Perception: Attention normal.        Mood and Affect: Mood normal.        Speech: Speech normal.        Behavior: Behavior normal. Behavior is cooperative.        Thought Content: Thought content normal.        Judgment: Judgment normal.    Results for orders placed or performed in visit on 08/26/22  HM DEXA SCAN  Result Value Ref Range   HM Dexa Scan see report in chart       Assessment & Plan:   Problem List Items Addressed This Visit       Digestive   GERD (gastroesophageal reflux disease)    Chronic, stable.  Continue daily Nexium and adjust regimen as needed.  Had colonoscopy and EGD in 2020.        Relevant Orders   CBC with Differential/Platelet     Musculoskeletal and Integument   Osteopenia - Primary    Ongoing, noted on DEXA. Continue daily Vitamin D3 and Calcium supplements.  Repeat DEXA in 2 years (04/13/25), family history and hormone treatment.  Vit D level today.      Relevant Orders   TSH   VITAMIN D 25 Hydroxy (Vit-D Deficiency, Fractures)     Genitourinary   Vaginal atrophy    Ongoing, continue post coital abx prophylaxis as needed to avoid UTI.  Refills as needed.        Other   Elevated low  density lipoprotein (LDL) cholesterol level    Noted on past labs.  Recheck today. Continue diet and exercise focus at home. The 10-year ASCVD risk score (Arnett DK, et al., 2019) is: 2.4%   Values used to calculate the score:     Age: 63 years     Sex: Female     Is Non-Hispanic African American: No     Diabetic: No     Tobacco smoker: No     Systolic Blood Pressure: 104 mmHg     Is BP treated: No     HDL Cholesterol: 62 mg/dL     Total Cholesterol: 179 mg/dL       Relevant Orders   Comprehensive metabolic panel   Lipid Panel w/o Chol/HDL Ratio   Insomnia    Chronic, stable.  Continue current medication regimen, Trazodone and Melatonin, will increase Trazodone so she can take 75-100 MG as needed.  Continue focus on good sleep hygiene techniques at home.        Postmenopausal hormone therapy    Continue collaboration with GYN at [facility]      Other Visit Diagnoses     Encounter for annual physical exam       Annual physical today with labs and health maintenance reviewed, discussed with patient.   Relevant Orders   CBC with Differential/Platelet   TSH        Follow up plan: Return in about 1 year (around 08/02/2024) for Annual Exam.  LABORATORY TESTING:  - Pap smear: up to date  IMMUNIZATIONS:   - Tdap: Tetanus vaccination  status reviewed: last tetanus booster within 10 years. - Influenza: Up to date - Pneumovax: Not applicable - Prevnar: Not applicable - HPV: Not applicable - Zostavax vaccine: is thinking about getting this with husband  SCREENING: -Mammogram: Up To Date next due 04/12/24 - Colonoscopy: Up to date  - Bone Density: Up To Date -- on 04/14/23 -- osteopenia -- continues on Vitamin D -Hearing Test: Not applicable  -Spirometry: Not applicable   PATIENT COUNSELING:   Advised to take 1 mg of folate supplement per day if capable of pregnancy.   Sexuality: Discussed sexually transmitted diseases, partner selection, use of condoms, avoidance  of unintended pregnancy  and contraceptive alternatives.   Advised to avoid cigarette smoking.  I discussed with the patient that most people either abstain from alcohol or drink within safe limits (<=14/week and <=4 drinks/occasion for males, <=7/weeks and <= 3 drinks/occasion for females) and that the risk for alcohol disorders and other health effects rises proportionally with the number of drinks per week and how often a drinker exceeds daily limits.  Discussed cessation/primary prevention of drug use and availability of treatment for abuse.   Diet: Encouraged to adjust caloric intake to maintain  or achieve ideal body weight, to reduce intake of dietary saturated fat and total fat, to limit sodium intake by avoiding high sodium foods and not adding table salt, and to maintain adequate dietary potassium and calcium preferably from fresh fruits, vegetables, and low-fat dairy products.    Stressed the importance of regular exercise  Injury prevention: Discussed safety belts, safety helmets, smoke detector, smoking near bedding or upholstery.   Dental health: Discussed importance of regular tooth brushing, flossing, and dental visits.    NEXT PREVENTATIVE PHYSICAL DUE IN 1 YEAR. Return in about 1 year (around 08/02/2024) for Annual Exam.

## 2023-08-03 NOTE — Assessment & Plan Note (Signed)
Continue collaboration with GYN at [facility]. 

## 2023-08-03 NOTE — Assessment & Plan Note (Signed)
Chronic, stable.  Continue daily Nexium and adjust regimen as needed.  Had colonoscopy and EGD in 2020.

## 2023-08-03 NOTE — Assessment & Plan Note (Signed)
Ongoing, continue post coital abx prophylaxis as needed to avoid UTI.  Refills as needed.

## 2023-08-04 NOTE — Progress Notes (Signed)
Contacted via MyChart   Good morning [name], your labs have returned and overall look great with exception of ongoing mild elevation in LDL.  No medications needed at this time though. The LDL is the bad cholesterol. Over time and in combination with inflammation and other factors, this contributes to plaque which in turn may lead to stroke and/or heart attack down the road. Sometimes high LDL is primarily genetic, and people might be eating all the right foods but still have high numbers. Other times, there is room for improvement in one's diet and eating healthier can bring this number down and potentially reduce one's risk of heart attack and/or stroke.   To reduce your LDL, Remember - more fruits and vegetables, more fish, and limit red meat and dairy products. More soy, nuts, beans, barley, lentils, oats and plant sterol ester enriched margarine instead of butter. I also encourage eliminating sugar and processed food. Remember, shop on the outside of the grocery store and visit your [employer]. Any questions? Keep being stellar!!  Thank you for allowing me to participate in your care.  I appreciate you. Kindest regards, 

## 2023-10-15 ENCOUNTER — Encounter: Payer: BC Managed Care – PPO | Admitting: Dermatology

## 2023-10-21 ENCOUNTER — Encounter: Payer: BC Managed Care – PPO | Admitting: Dermatology

## 2023-10-22 ENCOUNTER — Encounter: Payer: BC Managed Care – PPO | Admitting: Dermatology

## 2024-01-02 ENCOUNTER — Emergency Department
Admission: EM | Admit: 2024-01-02 | Discharge: 2024-01-03 | Disposition: A | Discharge status: Home / Self Care | Payer: BC Managed Care – PPO | Attending: Emergency Medicine | Admitting: Emergency Medicine

## 2024-01-02 ENCOUNTER — Emergency Department: Payer: BC Managed Care – PPO

## 2024-01-02 DIAGNOSIS — S99912A Unspecified injury of left ankle, initial encounter: Secondary | ICD-10-CM | POA: Diagnosis not present

## 2024-01-02 DIAGNOSIS — S82852A Displaced trimalleolar fracture of left lower leg, initial encounter for closed fracture: Secondary | ICD-10-CM | POA: Diagnosis not present

## 2024-01-02 DIAGNOSIS — M25372 Other instability, left ankle: Secondary | ICD-10-CM | POA: Diagnosis not present

## 2024-01-02 DIAGNOSIS — Y9323 Activity, snow (alpine) (downhill) skiing, snow boarding, sledding, tobogganing and snow tubing: Secondary | ICD-10-CM | POA: Insufficient documentation

## 2024-01-02 DIAGNOSIS — W228XXA Striking against or struck by other objects, initial encounter: Secondary | ICD-10-CM | POA: Insufficient documentation

## 2024-01-02 DIAGNOSIS — S82402A Unspecified fracture of shaft of left fibula, initial encounter for closed fracture: Secondary | ICD-10-CM | POA: Diagnosis not present

## 2024-01-02 DIAGNOSIS — S82892A Other fracture of left lower leg, initial encounter for closed fracture: Secondary | ICD-10-CM

## 2024-01-02 DIAGNOSIS — S82842A Displaced bimalleolar fracture of left lower leg, initial encounter for closed fracture: Secondary | ICD-10-CM | POA: Diagnosis not present

## 2024-01-02 DIAGNOSIS — S8252XA Displaced fracture of medial malleolus of left tibia, initial encounter for closed fracture: Secondary | ICD-10-CM | POA: Diagnosis not present

## 2024-01-02 MED ORDER — HYDROMORPHONE HCL 1 MG/ML IJ SOLN
0.5000 mg | Freq: Once | INTRAMUSCULAR | Status: AC
  Administered 2024-01-02: 0.5 mg via INTRAVENOUS
  Filled 2024-01-02: qty 0.5

## 2024-01-02 MED ORDER — ONDANSETRON HCL 4 MG/2ML IJ SOLN
4.0000 mg | Freq: Once | INTRAMUSCULAR | Status: AC
  Administered 2024-01-02: 4 mg via INTRAVENOUS
  Filled 2024-01-02: qty 2

## 2024-01-02 MED ORDER — OXYCODONE HCL 5 MG PO TABS
5.0000 mg | ORAL_TABLET | Freq: Once | ORAL | Status: AC
  Administered 2024-01-02: 5 mg via ORAL
  Filled 2024-01-02: qty 1

## 2024-01-02 MED ORDER — ONDANSETRON HCL 4 MG PO TABS
4.0000 mg | ORAL_TABLET | Freq: Once | ORAL | Status: AC
  Administered 2024-01-02: 4 mg via ORAL
  Filled 2024-01-02: qty 1

## 2024-01-02 NOTE — ED Provider Triage Note (Signed)
 Emergency Medicine Provider Triage Evaluation Note  [name] , a 63 y.o. female  was evaluated in triage.  Pt complains of left ankle pain after injury while sledding today. She went to [facility] and was x-rayed. Images show unstable bimalleolar fracture. Splint applied at [facility].   Physical Exam  There were no vitals taken for this visit. Gen:   Awake, no distress   Resp:  Normal effort  MSK:   Moves extremities without difficulty  Other:    Medical Decision Making  Medically screening exam initiated at 2:10 PM.  Appropriate orders placed.  Nell Holan was informed that the remainder of the evaluation will be completed by another provider, this initial triage assessment does not replace that evaluation, and the importance of remaining in the ED until their evaluation is complete.     Herlinda Kirk NOVAK, FNP 01/02/24 1415

## 2024-01-02 NOTE — ED Triage Notes (Signed)
 Pt was sledding and hit a tree. Pt has left ankle injury. Went to Ortho emergency and got xrays that showed a fracture, splinted and was sent here. Pt given toradol IM PTA.

## 2024-01-02 NOTE — ED Provider Notes (Signed)
 [facility] Provider Note    Event Date/Time   First MD Initiated Contact with Patient 01/02/24 2347     (approximate)   History   Ankle Pain   HPI  [name] is a 63 y.o. female who is otherwise healthy comes in for sliding and hitting a tree.  She reports taking direct injury to her left ankle.  She went to [facility] sent here for surgery after being splinted.  Patient given Toradol  prior to arrival.  Denies any other injuries, denies hitting head.  Physical Exam   Triage Vital Signs: ED Triage Vitals  Encounter Vitals Group     BP 01/02/24 1410 123/66     Systolic BP Percentile --      Diastolic BP Percentile --      Pulse Rate 01/02/24 1410 94     Resp 01/02/24 1410 18     Temp 01/02/24 2016 97.8 F (36.6 C)     Temp Source 01/02/24 2016 Oral     SpO2 01/02/24 1410 100 %     Weight 01/02/24 1411 145 lb (65.8 kg)     Height 01/02/24 1411 5' 3 (1.6 m)     Head Circumference --      Peak Flow --      Pain Score 01/02/24 1410 7     Pain Loc --      Pain Education --      Exclude from Growth Chart --     Most recent vital signs: Vitals:   01/02/24 1410 01/02/24 2016  BP: 123/66 125/71  Pulse: 94 82  Resp: 18 18  Temp:  97.8 F (36.6 C)  SpO2: 100% 100%     General: Awake, no distress.  CV:  Good peripheral perfusion.  Resp:  Normal effort.  No chest wall tenderness Abd:  No distention.  No abdominal tenderness Other:  Patient with splint in place.  Able to feel pulse good distal pulse able to wiggle toes. No hematoma, no neck pain, no chest pain, no abdominal pain.   ED Results / Procedures / Treatments   Labs (all labs ordered are listed, but only abnormal results are displayed) Labs Reviewed  CBC WITH DIFFERENTIAL/PLATELET  BASIC METABOLIC PANEL    RADIOLOGY I have reviewed the CT personally interpreted and positive fracture  PROCEDURES:  Critical Care performed: No  Procedures   MEDICATIONS ORDERED IN  ED: Medications  oxyCODONE  (Oxy IR/ROXICODONE ) immediate release tablet 5 mg (5 mg Oral Given 01/02/24 1419)  ondansetron  (ZOFRAN ) tablet 4 mg (4 mg Oral Given 01/02/24 1419)  oxyCODONE  (Oxy IR/ROXICODONE ) immediate release tablet 5 mg (5 mg Oral Given 01/02/24 1849)  oxyCODONE  (Oxy IR/ROXICODONE ) immediate release tablet 5 mg (5 mg Oral Given 01/02/24 2254)  HYDROmorphone  (DILAUDID ) injection 0.5 mg (0.5 mg Intravenous Given 01/02/24 2359)  ondansetron  (ZOFRAN ) injection 4 mg (4 mg Intravenous Given 01/02/24 2359)     IMPRESSION / MDM / ASSESSMENT AND PLAN / ED COURSE  I reviewed the triage vital signs and the nursing notes.   Patient's presentation is most consistent with acute presentation with potential threat to life or bodily function.   Patient comes in with mechanical ankle injury.  Sensation and pulse intact but already splinted.  Sent over for surgery.  Patient is gotten multiple doses of oxycodone  on relieving of pain.  Patient will be given a dose of IV Dilaudid  IV Zofran  preop labs ordered  D/w podiatry unable to do surgery- recommend d/w ortho surgery  D/w dr Edie from ortho impacted tibial pilon fracture and needs transfer to [facility]   D/w Barton recommends outpatient management of this in the next few days and elevating above the heart nor to help get the swelling down.  He states that this does not need to be transferred emergently.  Discussed my concern for pain control and he stated that the patient needed to be admitted would have to be to the hospitalist but that this does not need a emergent orthopedic surgery tomorrow. No other reduction I need to do in the emergency room.  The splint is good.  Discussed with patient and she is concerned about her pain control.  We discussed admission for pain control however they would probably be waiting for 1 to 2 days for transfer given no ambulances are arnt running however would be able to do pain control.  Has declined this and  prefers to go home.  Will discharge patient after pain medication.  Pain is now manageable with oral medications and she feels comfortable with discharge home  He is requesting some type of muscle relaxer reports being on Robaxin  previously.  We discussed trying to not to use at the exact same time as the oxycodone  did to avoid sedation.  She is also requesting some Zofran  to help with nausea with the pain medication.  She will call [facility] or follow-up with the [facility] orthopedic doctor.  Nursing is to be nonweightbearing and that she needs to be seen for surgery next week.  FINAL CLINICAL IMPRESSION(S) / ED DIAGNOSES   Final diagnoses:  Closed fracture of left ankle, initial encounter     Rx / DC Orders   ED Discharge Orders     None        Note:  This document was prepared using Dragon voice recognition software and may include unintentional dictation errors.   Ernest Ronal BRAVO, MD 01/03/24 [phone]

## 2024-01-03 LAB — CBC WITH DIFFERENTIAL/PLATELET
Abs Immature Granulocytes: 0.02 10*3/uL (ref 0.00–0.07)
Basophils Absolute: 0 10*3/uL (ref 0.0–0.1)
Basophils Relative: 0 %
Eosinophils Absolute: 0.1 10*3/uL (ref 0.0–0.5)
Eosinophils Relative: 1 %
HCT: 40 % (ref 36.0–46.0)
Hemoglobin: 13.6 g/dL (ref 12.0–15.0)
Immature Granulocytes: 0 %
Lymphocytes Relative: 37 %
Lymphs Abs: 3.3 10*3/uL (ref 0.7–4.0)
MCH: 29.4 pg (ref 26.0–34.0)
MCHC: 34 g/dL (ref 30.0–36.0)
MCV: 86.4 fL (ref 80.0–100.0)
Monocytes Absolute: 0.7 10*3/uL (ref 0.1–1.0)
Monocytes Relative: 8 %
Neutro Abs: 4.9 10*3/uL (ref 1.7–7.7)
Neutrophils Relative %: 54 %
Platelets: 250 10*3/uL (ref 150–400)
RBC: 4.63 MIL/uL (ref 3.87–5.11)
RDW: 12 % (ref 11.5–15.5)
WBC: 9.1 10*3/uL (ref 4.0–10.5)
nRBC: 0 % (ref 0.0–0.2)

## 2024-01-03 LAB — BASIC METABOLIC PANEL
Anion gap: 11 (ref 5–15)
BUN: 22 mg/dL (ref 8–23)
CO2: 20 mmol/L — ABNORMAL LOW (ref 22–32)
Calcium: 9.2 mg/dL (ref 8.9–10.3)
Chloride: 103 mmol/L (ref 98–111)
Creatinine, Ser: 0.8 mg/dL (ref 0.44–1.00)
GFR, Estimated: >60 mL/min (ref >60)
Glucose, Bld: 103 mg/dL — ABNORMAL HIGH (ref 70–99)
Potassium: 3.7 mmol/L (ref 3.5–5.1)
Sodium: 134 mmol/L — ABNORMAL LOW (ref 135–145)

## 2024-01-03 MED ORDER — ONDANSETRON 4 MG PO TBDP: 4.0000 mg | ORAL_TABLET | Freq: Three times a day (TID) | ORAL | 0 refills | Status: AC | PRN

## 2024-01-03 MED ORDER — KETOROLAC TROMETHAMINE 15 MG/ML IJ SOLN
15.0000 mg | Freq: Once | INTRAMUSCULAR | Status: AC
Start: 2024-01-03 — End: 2024-01-03
  Administered 2024-01-03: 15 mg via INTRAVENOUS
  Filled 2024-01-03: qty 1

## 2024-01-03 MED ORDER — OXYCODONE HCL 5 MG PO TABS: 5.0000 mg | ORAL_TABLET | ORAL | 0 refills | Status: AC | PRN

## 2024-01-03 MED ORDER — METHOCARBAMOL 500 MG PO TABS
500.0000 mg | ORAL_TABLET | Freq: Once | ORAL | Status: AC
  Administered 2024-01-03: 500 mg via ORAL
  Filled 2024-01-03: qty 1

## 2024-01-03 MED ORDER — OXYCODONE HCL 5 MG PO TABS
5.0000 mg | ORAL_TABLET | Freq: Once | ORAL | Status: AC
  Administered 2024-01-03: 5 mg via ORAL
  Filled 2024-01-03: qty 1

## 2024-01-03 MED ORDER — METHOCARBAMOL 500 MG PO TABS: 500.0000 mg | ORAL_TABLET | Freq: Three times a day (TID) | ORAL | 0 refills | Status: AC | PRN

## 2024-01-03 NOTE — Discharge Instructions (Addendum)
 Take Tylenol  1 g every 8 hours.  Use the oxycodone  for breakthrough pain.  Use the muscle relaxer but try not to take at the exact same time to help prevent sedation.  Do not bear any weight on it.  Call [facility] here and see if they have an orthopedic surgeon that can manage this and if not you can follow-up with the other orthopedic surgeon listed in [facility]  Return to the ER for worsening pain inability to manage at home or any other concerns  We attempted to transfer you but unfortunately the orthopedic surgeon stated that this need to be dealt with outpatient you need to keep your leg elevated up over your heart and take Tylenol  1 g every 8 hours with the mother medications to help with your pain.  Return to the ER for worsening symptoms or any other concerns.  You should have surgery this week and you need to call the orthopedic offices to follow-up with them.   IMPRESSION: 1. Acute, complex trimalleolar fracture of the left ankle, as described above. 2. Medial subluxation of the talar dome relative to the distal tibia without dislocation. 3. Soft tissue swelling with ill-defined hematoma about the ankle, more pronounced medially.  Take oxycodone  as prescribed. Do not drink alcohol, drive or participate in any other potentially dangerous activities while taking this medication as it may make you sleepy. Do not take this medication with any other sedating medications, either prescription or over-the-counter. If you were prescribed Percocet or Vicodin, do not take these with acetaminophen  (Tylenol ) as it is already contained within these medications.  This medication is an opiate (or narcotic) pain medication and can be habit forming. Use it as little as possible to achieve adequate pain control. Do not use or use it with extreme caution if you have a history of opiate abuse or dependence. If you are on a pain contract with your primary care doctor or a pain specialist, be sure to let them  know you were prescribed this medication today from the Emergency Department. This medication is intended for your use only - do not give any to anyone else and keep it in a secure place where nobody else, especially children, have access to it.

## 2024-01-04 DIAGNOSIS — S82872A Displaced pilon fracture of left tibia, initial encounter for closed fracture: Secondary | ICD-10-CM | POA: Diagnosis not present

## 2024-01-04 HISTORY — PX: ANKLE ARTHROSCOPY WITH OPEN REDUCTION INTERNAL FIXATION (ORIF): SHX5582

## 2024-01-05 DIAGNOSIS — S82832A Other fracture of upper and lower end of left fibula, initial encounter for closed fracture: Secondary | ICD-10-CM | POA: Diagnosis not present

## 2024-01-05 DIAGNOSIS — X58XXXA Exposure to other specified factors, initial encounter: Secondary | ICD-10-CM | POA: Diagnosis not present

## 2024-01-05 DIAGNOSIS — S82892A Other fracture of left lower leg, initial encounter for closed fracture: Secondary | ICD-10-CM | POA: Diagnosis not present

## 2024-01-05 DIAGNOSIS — S82872A Displaced pilon fracture of left tibia, initial encounter for closed fracture: Secondary | ICD-10-CM | POA: Diagnosis not present

## 2024-01-05 DIAGNOSIS — G8918 Other acute postprocedural pain: Secondary | ICD-10-CM | POA: Diagnosis not present

## 2024-01-05 DIAGNOSIS — Y998 Other external cause status: Secondary | ICD-10-CM | POA: Diagnosis not present

## 2024-01-20 DIAGNOSIS — M25572 Pain in left ankle and joints of left foot: Secondary | ICD-10-CM | POA: Diagnosis not present

## 2024-01-28 DIAGNOSIS — D219 Benign neoplasm of connective and other soft tissue, unspecified: Secondary | ICD-10-CM | POA: Diagnosis not present

## 2024-01-28 DIAGNOSIS — R6882 Decreased libido: Secondary | ICD-10-CM | POA: Diagnosis not present

## 2024-01-28 DIAGNOSIS — Z1331 Encounter for screening for depression: Secondary | ICD-10-CM | POA: Diagnosis not present

## 2024-01-28 DIAGNOSIS — Z01411 Encounter for gynecological examination (general) (routine) with abnormal findings: Secondary | ICD-10-CM | POA: Diagnosis not present

## 2024-01-28 DIAGNOSIS — Z124 Encounter for screening for malignant neoplasm of cervix: Secondary | ICD-10-CM | POA: Diagnosis not present

## 2024-01-28 DIAGNOSIS — Z79890 Hormone replacement therapy (postmenopausal): Secondary | ICD-10-CM | POA: Diagnosis not present

## 2024-01-28 DIAGNOSIS — F520 Hypoactive sexual desire disorder: Secondary | ICD-10-CM | POA: Diagnosis not present

## 2024-02-16 ENCOUNTER — Ambulatory Visit: Payer: BC Managed Care – PPO | Admitting: Dermatology

## 2024-02-17 DIAGNOSIS — M25572 Pain in left ankle and joints of left foot: Secondary | ICD-10-CM | POA: Diagnosis not present

## 2024-02-23 ENCOUNTER — Ambulatory Visit: Attending: Orthopaedic Surgery | Admitting: Physical Therapy

## 2024-02-23 DIAGNOSIS — M25572 Pain in left ankle and joints of left foot: Secondary | ICD-10-CM | POA: Insufficient documentation

## 2024-02-23 NOTE — Therapy (Addendum)
 OUTPATIENT PHYSICAL THERAPY LOWER EXTREMITY EVALUATION   Patient Name: [name] MRN: [id number] DOB:Apr 25, 1961, 63 y.o., female Today's Date: 02/23/2024  END OF SESSION:  PT End of Session - 02/23/24 1116     Visit Number 1    Number of Visits 20    Date for PT Re-Evaluation 05/03/24    Authorization Type [employer] [id number]    Authorization - Visit Number 1    Authorization - Number of Visits 20    Progress Note Due on Visit 10    PT Start Time 0955    PT Stop Time 1030    PT Time Calculation (min) 35 min    Activity Tolerance Patient tolerated treatment well    Behavior During Therapy WFL for tasks assessed/performed             Past Medical History:  Diagnosis Date   Complication of anesthesia    PONV (postoperative nausea and vomiting)    Past Surgical History:  Procedure Laterality Date   AUGMENTATION MAMMAPLASTY Bilateral 2010   BICEPT TENODESIS Right 09/24/2021   Procedure: BICEPS TENODESIS;  Surgeon: Cammy Copa, MD;  Location: [facility] OR;  Service: Orthopedics;  Laterality: Right;   CERVICAL SPINE SURGERY  2003   C5-C7- double dissectomy   PAROTID GLAND TUMOR EXCISION     SHOULDER ARTHROSCOPY Right 09/24/2021   Procedure: right shoulder arthroscopy, debridement,open rotator cuff tear repair;  Surgeon: Cammy Copa, MD;  Location: [facility] OR;  Service: Orthopedics;  Laterality: Right;   WISDOM TOOTH EXTRACTION Bilateral    Patient Active Problem List   Diagnosis Date Noted   Elevated low density lipoprotein (LDL) cholesterol level 08/01/2023   Complete tear of right rotator cuff    Degenerative superior labral anterior-to-posterior (SLAP) tear of right shoulder    Healthcare maintenance 07/31/2021   Congenital pes cavus 02/25/2021   History of uterine fibroid 01/05/2021   Herpes 07/12/2020   GERD (gastroesophageal reflux disease) 05/23/2020   Vaginal atrophy 05/23/2020   Cervical spine arthritis 05/23/2020   Osteopenia 05/23/2020   Insomnia 05/20/2020    Postmenopausal hormone therapy 05/20/2020    PCP: Dr. Aura Dials   REFERRING PROVIDER: Dr. Dub Mikes   REFERRING DIAG: Left Ankle Fracture   THERAPY DIAG:  Pain in left ankle and joints of left foot - Plan: PT plan of care cert/re-cert  Rationale for Evaluation and Treatment: Rehabilitation  ONSET DATE: 01/05/24 (Surgery Date)   SUBJECTIVE:   SUBJECTIVE STATEMENT: See pertinent information   PERTINENT HISTORY: Pt reports sustaining a left ankle injury on January 11th after sledding into a tree. She sustained a pylon fracture of left ankle and she has since been non-weight bearing in post operative boot. He wounds have healed since and she is experiencing swelling in left foot but this has also reduced since initial surgery. She is to remain non-weight bearing on left ankle until March 12th.  PAIN:  Are you having pain? Yes: NPRS scale: 3/10  Pain location: Medial and lateral right ankle  Pain description: Achy  Aggravating factors: When moving ankle  Relieving factors: Keeping ankle still   PRECAUTIONS: None  RED FLAGS: None   WEIGHT BEARING RESTRICTIONS: Yes Non-weight bearing and needs to use post surgical boot and scooter until March 12th   FALLS:  Has patient fallen in last 6 months? No  LIVING ENVIRONMENT: Lives with: lives with their spouse Lives in: House/apartment Stairs:  Did not ask  Has following equipment at home: Copywriter, advertising  OCCUPATION: Nurse; works at an outpatient clinic   PLOF: Independent  PATIENT GOALS: To return to walking like she did before the accident.   NEXT MD VISIT: Not sure, end of March   OBJECTIVE:  Note: Objective measures were completed at Evaluation unless otherwise noted.  VITALS:  BP 108/52  HR 93 SpO2 99   DIAGNOSTIC FINDINGS:   CLINICAL DATA:  Ankle trauma, fracture, xray done (Age >= 5y)   EXAM: CT OF THE LEFT ANKLE WITHOUT CONTRAST   TECHNIQUE: Multidetector CT imaging of the left ankle  was performed according to the standard protocol. Multiplanar CT image reconstructions were also generated.   RADIATION DOSE REDUCTION: This exam was performed according to the departmental dose-optimization program which includes automated exposure control, adjustment of the mA and/or kV according to patient size and/or use of iterative reconstruction technique.   COMPARISON:  None Available.   FINDINGS: Bones/Joint/Cartilage   Acute, complex trimalleolar fracture of the left ankle. Vertically oriented fracture through the base of the medial malleolus with mild medial displacement resulting in approximately 4 mm of articular-surface diastasis. Comminuted intra-articular fracture of the tibial plafond and with vertical extension into the distal metaphysis. Approximately 5 mm of articular-surface depression and diastasis. Transversely oriented fracture through the distal fibular metaphysis without significant displacement. Medial subluxation of the talar dome relative to the distal tibia without dislocation.   Bones of the hindfoot and midfoot are intact without fracture or malalignment. Joint spaces are preserved.   Ligaments   Suboptimally assessed by CT.   Muscles and Tendons   No acute musculotendinous abnormality by CT although position of the tibialis posterior tendon and flexor digitorum longus tendons could be at risk for entrapment.   Soft tissues   Soft tissue swelling with ill-defined hematoma about the ankle, more pronounced medially. No soft tissue air to suggest open fracture.   IMPRESSION: 1. Acute, complex trimalleolar fracture of the left ankle, as described above. 2. Medial subluxation of the talar dome relative to the distal tibia without dislocation. 3. Soft tissue swelling with ill-defined hematoma about the ankle, more pronounced medially.     Electronically Signed   By: Duanne Guess D.O.   On: 01/02/2024 14:54    PATIENT SURVEYS:   LEFS NT   COGNITION: Overall cognitive status: Within functional limits for tasks assessed     SENSATION: Light touch: Impaired   EDEMA:  Figure 8: NT    POSTURE: No Significant postural limitations  PALPATION: Dorsal side of right DIPS TTP   LOWER EXTREMITY ROM:  A/PROM Right eval Left eval  Hip flexion    Hip extension    Hip abduction    Hip adduction    Hip internal rotation    Hip external rotation    Knee flexion    Knee extension    Ankle dorsiflexion 10/12 5/7  Ankle plantarflexion 50 8/9  Ankle inversion 35 10/12  Ankle eversion 15 5/5   (Blank rows = not tested)    LOWER EXTREMITY MMT:  MMT Right eval Left eval  Hip flexion    Hip extension    Hip abduction    Hip adduction    Hip internal rotation    Hip external rotation    Knee flexion    Knee extension    Ankle dorsiflexion (Long Sitting) 4+ 3+  Ankle plantarflexion (Long Sitting) 4+ 4-  Ankle inversion 4+ 3+  Ankle eversion 4+ 3+   (Blank rows = not tested)   FUNCTIONAL TESTS:  None performed   GAIT:  N/a; non-weight bearing on left foot                                                                                                                                 TREATMENT DATE:   02/23/24: All exercises performed on LLE  Ankle Circles Clockwise 1 x 20    Ankle Pumps  1 X 20  PF Stretch 3 x 60 sec    PATIENT EDUCATION:  Education details: Form and technique for correct performance of exercise. Person educated: Patient Education method: Explanation, Demonstration, Verbal cues, and Handouts Education comprehension: verbalized understanding and returned demonstration  HOME EXERCISE PROGRAM: Access Code: [email or web address] URL: [email or web address] Date: 02/23/2024 Prepared by: Ellin Goodie  Exercises - Supine Ankle Circles  - 1 x daily - 7 x weekly - 3 sets - 10 reps - Supine Single Leg Ankle Pumps  - 1 x daily - 7 x weekly - 3 sets - 20 reps - Long Sitting Calf  Stretch with Strap  - 1 x daily - 3 reps - 30-60 sec sec  hold  ASSESSMENT:  CLINICAL IMPRESSION: Patient is a 63 y.o. white female who was seen today for physical therapy evaluation and treatment for s/p 7 weeks ORIF of left ankle after suffering pylon fracture on January 11th. 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 She will benefit from skilled PT to address these aforementioned deficits to return to walking and weight bearing on left ankle to return to job and performing self care and household tasks.   OBJECTIVE IMPAIRMENTS: Abnormal gait, difficulty walking, decreased ROM, decreased strength, hypomobility, increased edema, impaired sensation, and pain.   ACTIVITY LIMITATIONS: carrying, lifting, bending, standing, squatting, stairs, bathing, toileting, dressing, hygiene/grooming, locomotion level, and caring for others  PARTICIPATION LIMITATIONS: cleaning, laundry, driving, shopping, community activity, and occupation  PERSONAL FACTORS: Age, Education, Fitness, Profession, and Time since onset of injury/illness/exacerbation are also affecting patient's functional outcome.   REHAB POTENTIAL: Good  CLINICAL DECISION MAKING: Stable/uncomplicated  EVALUATION COMPLEXITY: Low   GOALS: Goals reviewed with patient? No  SHORT TERM GOALS: Target date: 03/08/2024  Patient will demonstrate undestanding of home exercise plan by performing exercises correctly with evidence of good carry over with min to no verbal or tactile cues when demonstrating exercises. Baseline: NT  Goal status: INITIAL  2.  Patient will be able to perform partial weight bearing exercises on LLE as evidence of ongoing healing and improved function in left ankle.  Baseline: Unable to weight bear on left ankle until March 12th  Goal status: INITIAL   LONG  TERM GOALS: Target date: 05/03/2024  Patient will improve her LEFS score by >=9 pt to demonstrate a statistically significant improvement in her right ankle function to be able to  walk longer distances to collect items for her job. (Binkley et al 1999) Baseline: NT             Goal status: INITIAL   2.  Patient will demonstrate improved left ankle AROM with range of motion within 20% of right ankle to improve ankle mechanics to ambulate without limitations.  Baseline: Ankle DF R/L 10/5, Ankle PF 50/8, Ankle Inv R/L 35/10, Ankle Ev R/L 15/5  Goal status: INITIAL  3.  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  Patient will improve left ankle strength to be near symmetrical to right ankle for improved left ankle function to be able to normalize gait and return to weight bearing activities for her job.  Baseline: Ankle DF R/L 4+/3+, Ankle PF R/L 4+/4-, Ankle Ever R/L 4+/4- , Ankle Inv R/L 4+/4-  Goal status: INITIAL     PLAN:  PT FREQUENCY: 1-2x/week  PT DURATION: 10 weeks  PLANNED INTERVENTIONS: 97164- PT Re-evaluation, 97110-Therapeutic exercises, 97530- Therapeutic activity, 97112- Neuromuscular re-education, 97535- Self Care, 16109- Manual therapy, [phone]- Gait training, [phone]- Orthotic Fit/training, [phone]- Aquatic Therapy, [phone]- Electrical stimulation (unattended), [phone]- Electrical stimulation (manual), 97597- Wound care (first 20 sq cm), Patient/Family education, Balance training, Stair training, Dry Needling, Joint mobilization, Joint manipulation, Spinal manipulation, Spinal mobilization, Scar mobilization, DME instructions, Cryotherapy, and Moist heat  PLAN FOR NEXT SESSION: Take LEFS survery. Edema massage. Progress ongoing ankle strengthening  exercises.    Ellin Goodie PT, DPT  [facility] [address] [place], [address] Phone: [phone]   Fax:  [phone]

## 2024-02-25 ENCOUNTER — Ambulatory Visit: Admitting: Physical Therapy

## 2024-02-25 DIAGNOSIS — M25572 Pain in left ankle and joints of left foot: Secondary | ICD-10-CM

## 2024-02-25 NOTE — Therapy (Addendum)
 OUTPATIENT PHYSICAL THERAPY LOWER EXTREMITY TREATMENT    Patient Name: [name] MRN: [id number] DOB:Oct 31, 1961, 63 y.o., female Today's Date: 02/25/2024  END OF SESSION:  PT End of Session - 02/25/24 1434     Visit Number 2    Number of Visits 20    Date for PT Re-Evaluation 05/03/24    Authorization Type [id number]    Authorization - Visit Number 2    Authorization - Number of Visits 20    Progress Note Due on Visit 10    PT Start Time 1435    PT Stop Time 1515    PT Time Calculation (min) 40 min    Activity Tolerance Patient tolerated treatment well    Behavior During Therapy WFL for tasks assessed/performed             Past Medical History:  Diagnosis Date   Complication of anesthesia    PONV (postoperative nausea and vomiting)    Past Surgical History:  Procedure Laterality Date   AUGMENTATION MAMMAPLASTY Bilateral 2010   BICEPT TENODESIS Right 09/24/2021   Procedure: BICEPS TENODESIS;  Surgeon: Cammy Copa, MD;  Location: [facility] OR;  Service: Orthopedics;  Laterality: Right;   CERVICAL SPINE SURGERY  2003   C5-C7- double dissectomy   PAROTID GLAND TUMOR EXCISION     SHOULDER ARTHROSCOPY Right 09/24/2021   Procedure: right shoulder arthroscopy, debridement,open rotator cuff tear repair;  Surgeon: Cammy Copa, MD;  Location: [facility] OR;  Service: Orthopedics;  Laterality: Right;   WISDOM TOOTH EXTRACTION Bilateral    Patient Active Problem List   Diagnosis Date Noted   Elevated low density lipoprotein (LDL) cholesterol level 08/01/2023   Complete tear of right rotator cuff    Degenerative superior labral anterior-to-posterior (SLAP) tear of right shoulder    Healthcare maintenance 07/31/2021   Congenital pes cavus 02/25/2021   History of uterine fibroid 01/05/2021   Herpes 07/12/2020   GERD (gastroesophageal reflux disease) 05/23/2020   Vaginal atrophy 05/23/2020   Cervical spine arthritis 05/23/2020   Osteopenia 05/23/2020   Insomnia 05/20/2020    Postmenopausal hormone therapy 05/20/2020    PCP: Dr. Aura Dials   REFERRING PROVIDER: Dr. Dub Mikes   REFERRING DIAG: Left Ankle Fracture   THERAPY DIAG:  Pain in left ankle and joints of left foot  Rationale for Evaluation and Treatment: Rehabilitation  ONSET DATE: 01/05/24 (Surgery Date)   SUBJECTIVE:   SUBJECTIVE STATEMENT: Pt states she had a large increase in her left ankle pain after doing circles. She had to take tramadol and it has since felt good.   PERTINENT HISTORY: Pt reports sustaining a left ankle injury on January 11th after sledding into a tree. She sustained a pylon fracture of left ankle and she has since been non-weight bearing in post operative boot. He wounds have healed since and she is experiencing swelling in left foot but this has also reduced since initial surgery. She is to remain non-weight bearing on left ankle until March 12th.  PAIN:  Are you having pain? Yes: NPRS scale: 3/10  Pain location: Medial and lateral right ankle  Pain description: Achy  Aggravating factors: When moving ankle  Relieving factors: Keeping ankle still   PRECAUTIONS: None  RED FLAGS: None   WEIGHT BEARING RESTRICTIONS: Yes Non-weight bearing and needs to use post surgical boot and scooter until March 12th   FALLS:  Has patient fallen in last 6 months? No  LIVING ENVIRONMENT: Lives with: lives with their spouse Lives in:  House/apartment Stairs:  Did not ask  Has following equipment at home: shower chair and Scooter   OCCUPATION: Nurse; works at an outpatient clinic   PLOF: Independent  PATIENT GOALS: To return to walking like she did before the accident.   NEXT MD VISIT: Not sure, end of March   OBJECTIVE:  Note: Objective measures were completed at Evaluation unless otherwise noted.  VITALS:  BP 108/52  HR 93 SpO2 99   DIAGNOSTIC FINDINGS:   CLINICAL DATA:  Ankle trauma, fracture, xray done (Age >= 5y)   EXAM: CT OF THE LEFT ANKLE WITHOUT  CONTRAST   TECHNIQUE: Multidetector CT imaging of the left ankle was performed according to the standard protocol. Multiplanar CT image reconstructions were also generated.   RADIATION DOSE REDUCTION: This exam was performed according to the departmental dose-optimization program which includes automated exposure control, adjustment of the mA and/or kV according to patient size and/or use of iterative reconstruction technique.   COMPARISON:  None Available.   FINDINGS: Bones/Joint/Cartilage   Acute, complex trimalleolar fracture of the left ankle. Vertically oriented fracture through the base of the medial malleolus with mild medial displacement resulting in approximately 4 mm of articular-surface diastasis. Comminuted intra-articular fracture of the tibial plafond and with vertical extension into the distal metaphysis. Approximately 5 mm of articular-surface depression and diastasis. Transversely oriented fracture through the distal fibular metaphysis without significant displacement. Medial subluxation of the talar dome relative to the distal tibia without dislocation.   Bones of the hindfoot and midfoot are intact without fracture or malalignment. Joint spaces are preserved.   Ligaments   Suboptimally assessed by CT.   Muscles and Tendons   No acute musculotendinous abnormality by CT although position of the tibialis posterior tendon and flexor digitorum longus tendons could be at risk for entrapment.   Soft tissues   Soft tissue swelling with ill-defined hematoma about the ankle, more pronounced medially. No soft tissue air to suggest open fracture.   IMPRESSION: 1. Acute, complex trimalleolar fracture of the left ankle, as described above. 2. Medial subluxation of the talar dome relative to the distal tibia without dislocation. 3. Soft tissue swelling with ill-defined hematoma about the ankle, more pronounced medially.     Electronically Signed   By:  Duanne Guess D.O.   On: 01/02/2024 14:54    PATIENT SURVEYS:  LEFS NT   COGNITION: Overall cognitive status: Within functional limits for tasks assessed     SENSATION: Light touch: Impaired   EDEMA:  Figure 8: NT    POSTURE: No Significant postural limitations  PALPATION: Dorsal side of right DIPS TTP   LOWER EXTREMITY ROM:  A/PROM Right eval Left eval  Hip flexion    Hip extension    Hip abduction    Hip adduction    Hip internal rotation    Hip external rotation    Knee flexion    Knee extension    Ankle dorsiflexion 10/12 5/7  Ankle plantarflexion 50 8/9  Ankle inversion 35 10/12  Ankle eversion 15 5/5   (Blank rows = not tested)    LOWER EXTREMITY MMT:  MMT Right eval Left eval  Hip flexion    Hip extension    Hip abduction    Hip adduction    Hip internal rotation    Hip external rotation    Knee flexion    Knee extension    Ankle dorsiflexion (Long Sitting) 4+ 3+  Ankle plantarflexion (Long Sitting) 4+ 4-  Ankle inversion  4+ 3+  Ankle eversion 4+ 3+   (Blank rows = not tested)   FUNCTIONAL TESTS:  None performed   GAIT:  N/a; non-weight bearing on left foot                                                                                                                                 TREATMENT DATE:   02/25/24: All exercises performed on LLE  THEREX  Nu-Step seat and arms at 7 for 5 min with towel under left heel to make foot contact pedal.  Long Sitting PF with red band 3 x 10 use of cold pack on ankle -Pt reports increased left foot pain   Self Care  Edema Massage  Scar Tissue Massage    02/23/24: All exercises performed on LLE  Ankle Circles Clockwise 1 x 20    Ankle Pumps  1 X 20  PF Stretch 3 x 60 sec    PATIENT EDUCATION:  Education details: Form and technique for correct performance of exercise. Person educated: Patient Education method: Explanation, Demonstration, Verbal cues, and Handouts Education  comprehension: verbalized understanding and returned demonstration  HOME EXERCISE PROGRAM: Access Code: [email or web address] URL: [email or web address] Date: 02/25/2024 Prepared by: Ellin Goodie  Exercises - Supine Ankle Circles  - 1 x daily - 7 x weekly - 3 sets - 10 reps - Supine Single Leg Ankle Pumps  - 1 x daily - 7 x weekly - 3 sets - 20 reps - Long Sitting Calf Stretch with Strap  - 1 x daily - 3 reps - 30-60 sec sec  hold - Long Sitting Ankle Plantar Flexion with Resistance  - 3-4 x weekly - 3 sets - 10 reps  Patient Education - Scar Massage  ASSESSMENT:  CLINICAL IMPRESSION: Pt presents s/p 7 weeks ORIF of left ankle. Pt able to complete all non-weight bearing exercises without an increase in her pain. She was able to perform edema and scar massage independently. She will benefit from skilled PT to address these aforementioned deficits to return to walking and weight bearing on left ankle to return to job and performing self care and household tasks.   OBJECTIVE IMPAIRMENTS: Abnormal gait, difficulty walking, decreased ROM, decreased strength, hypomobility, increased edema, impaired sensation, and pain.   ACTIVITY LIMITATIONS: carrying, lifting, bending, standing, squatting, stairs, bathing, toileting, dressing, hygiene/grooming, locomotion level, and caring for others  PARTICIPATION LIMITATIONS: cleaning, laundry, driving, shopping, community activity, and occupation  PERSONAL FACTORS: Age, Education, Fitness, Profession, and Time since onset of injury/illness/exacerbation are also affecting patient's functional outcome.   REHAB POTENTIAL: Good  CLINICAL DECISION MAKING: Stable/uncomplicated  EVALUATION COMPLEXITY: Low   GOALS: Goals reviewed with patient? No  SHORT TERM GOALS: Target date: 03/08/2024  Patient will demonstrate undestanding of home exercise plan by performing exercises correctly with evidence of good carry over with min to no verbal or tactile  cues when demonstrating exercises. Baseline: NT  Goal status: INITIAL  2.  Patient will be able to perform partial weight bearing exercises on LLE as evidence of ongoing healing and improved function in left ankle.  Baseline: Unable to weight bear on left ankle until March 12th  Goal status: INITIAL   LONG TERM GOALS: Target date: 05/03/2024  Patient will improve her LEFS score by >=9 pt to demonstrate a statistically significant improvement in her right ankle function to be able to walk longer distances to collect items for her job. (Binkley et al 1999) Baseline: NT             Goal status: INITIAL   2.  Patient will demonstrate improved left ankle AROM with range of motion within 20% of right ankle to improve ankle mechanics to ambulate without limitations.  Baseline: Ankle DF R/L 10/5, Ankle PF 50/8, Ankle Inv R/L 35/10, Ankle Ev R/L 15/5  Goal status: INITIAL  3.  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  Patient will improve left ankle strength to be near symmetrical to right ankle for improved left ankle function to be able to normalize gait and return to weight bearing activities for her job.  Baseline: Ankle DF R/L 4+/3+, Ankle PF R/L 4+/4-, Ankle Ever R/L 4+/4- , Ankle Inv R/L 4+/4-  Goal status: INITIAL     PLAN:  PT FREQUENCY: 1-2x/week  PT DURATION: 10 weeks  PLANNED INTERVENTIONS: 97164- PT Re-evaluation, 97110-Therapeutic exercises, 97530- Therapeutic activity, 97112- Neuromuscular re-education, 97535- Self Care, 16109- Manual therapy, [phone]- Gait training, [phone]- Orthotic Fit/training, [phone]- Aquatic Therapy, [phone]- Electrical stimulation (unattended), [phone]- Electrical stimulation (manual), 97597- Wound care (first 20 sq cm),  Patient/Family education, Balance training, Stair training, Dry Needling, Joint mobilization, Joint manipulation, Spinal manipulation, Spinal mobilization, Scar mobilization, DME instructions, Cryotherapy, and Moist heat  PLAN FOR NEXT SESSION: Take LEFS survery. Edema massage. Progress strengthening exercises: ankle DF and inversion resisted    Ellin Goodie PT, DPT  [facility] [address] [place], [address] Phone: [phone]   Fax:  [phone]

## 2024-02-29 ENCOUNTER — Ambulatory Visit: Admitting: Physical Therapy

## 2024-03-02 DIAGNOSIS — D252 Subserosal leiomyoma of uterus: Secondary | ICD-10-CM | POA: Diagnosis not present

## 2024-03-02 DIAGNOSIS — D251 Intramural leiomyoma of uterus: Secondary | ICD-10-CM | POA: Diagnosis not present

## 2024-03-02 DIAGNOSIS — D219 Benign neoplasm of connective and other soft tissue, unspecified: Secondary | ICD-10-CM | POA: Diagnosis not present

## 2024-03-07 ENCOUNTER — Ambulatory Visit: Admitting: Physical Therapy

## 2024-03-07 ENCOUNTER — Encounter: Payer: Self-pay | Admitting: Physical Therapy

## 2024-03-07 DIAGNOSIS — M25572 Pain in left ankle and joints of left foot: Secondary | ICD-10-CM

## 2024-03-07 NOTE — Therapy (Signed)
 OUTPATIENT PHYSICAL THERAPY LOWER EXTREMITY TREATMENT    Patient Name: [name] MRN: [id number] DOB:02/17/1961, 63 y.o., female Today's Date: 03/07/2024  END OF SESSION:  PT End of Session - 03/07/24 0911     Visit Number 3    Number of Visits 20    Date for PT Re-Evaluation 05/03/24    Authorization Type [id number]    Authorization - Visit Number 3    Authorization - Number of Visits 20    Progress Note Due on Visit 10    PT Start Time 0905    PT Stop Time 0950    PT Time Calculation (min) 45 min    Activity Tolerance Patient tolerated treatment well    Behavior During Therapy [facility] for tasks assessed/performed             Past Medical History:  Diagnosis Date   Complication of anesthesia    PONV (postoperative nausea and vomiting)    Past Surgical History:  Procedure Laterality Date   AUGMENTATION MAMMAPLASTY Bilateral 2010   BICEPT TENODESIS Right 09/24/2021   Procedure: BICEPS TENODESIS;  Surgeon: Cammy Copa, MD;  Location: [facility] OR;  Service: Orthopedics;  Laterality: Right;   CERVICAL SPINE SURGERY  2003   C5-C7- double dissectomy   PAROTID GLAND TUMOR EXCISION     SHOULDER ARTHROSCOPY Right 09/24/2021   Procedure: right shoulder arthroscopy, debridement,open rotator cuff tear repair;  Surgeon: Cammy Copa, MD;  Location: [facility] OR;  Service: Orthopedics;  Laterality: Right;   WISDOM TOOTH EXTRACTION Bilateral    Patient Active Problem List   Diagnosis Date Noted   Elevated low density lipoprotein (LDL) cholesterol level 08/01/2023   Complete tear of right rotator cuff    Degenerative superior labral anterior-to-posterior (SLAP) tear of right shoulder    Healthcare maintenance 07/31/2021   Congenital pes cavus 02/25/2021   History of uterine fibroid 01/05/2021   Herpes 07/12/2020   GERD (gastroesophageal reflux disease) 05/23/2020   Vaginal atrophy 05/23/2020   Cervical spine arthritis 05/23/2020   Osteopenia 05/23/2020   Insomnia 05/20/2020    Postmenopausal hormone therapy 05/20/2020    PCP: Dr. Aura Dials   REFERRING PROVIDER: Dr. Dub Mikes   REFERRING DIAG: Left Ankle Fracture   THERAPY DIAG:  Pain in left ankle and joints of left foot  Rationale for Evaluation and Treatment: Rehabilitation  ONSET DATE: 01/05/24 (Surgery Date)   SUBJECTIVE:   SUBJECTIVE STATEMENT: Pt describes feeling discouraged by the pain she feels when trying to place pressure on her right foot. She tried to do toe touch weight bearing on her right foot and felt an increase in her right foot pain.   PERTINENT HISTORY: Pt reports sustaining a left ankle injury on January 11th after sledding into a tree. She sustained a pylon fracture of left ankle and she has since been non-weight bearing in post operative boot. He wounds have healed since and she is experiencing swelling in left foot but this has also reduced since initial surgery. She is to remain non-weight bearing on left ankle until March 12th.  PAIN:  Are you having pain? Yes: NPRS scale: 3/10  Pain location: Medial and lateral right ankle  Pain description: Achy  Aggravating factors: When moving ankle  Relieving factors: Keeping ankle still   PRECAUTIONS: None  RED FLAGS: None   WEIGHT BEARING RESTRICTIONS: Yes Non-weight bearing and needs to use post surgical boot and scooter until March 12th   FALLS:  Has patient fallen in last 6  months? No  LIVING ENVIRONMENT: Lives with: lives with their spouse Lives in: House/apartment Stairs:  Did not ask  Has following equipment at home: shower chair and Scooter   OCCUPATION: Nurse; works at an outpatient clinic   PLOF: Independent  PATIENT GOALS: To return to walking like she did before the accident.   NEXT MD VISIT: Not sure, end of March   OBJECTIVE:  Note: Objective measures were completed at Evaluation unless otherwise noted.  VITALS:  BP 108/52  HR 93 SpO2 99   DIAGNOSTIC FINDINGS:   CLINICAL DATA:  Ankle  trauma, fracture, xray done (Age >= 5y)   EXAM: CT OF THE LEFT ANKLE WITHOUT CONTRAST   TECHNIQUE: Multidetector CT imaging of the left ankle was performed according to the standard protocol. Multiplanar CT image reconstructions were also generated.   RADIATION DOSE REDUCTION: This exam was performed according to the departmental dose-optimization program which includes automated exposure control, adjustment of the mA and/or kV according to patient size and/or use of iterative reconstruction technique.   COMPARISON:  None Available.   FINDINGS: Bones/Joint/Cartilage   Acute, complex trimalleolar fracture of the left ankle. Vertically oriented fracture through the base of the medial malleolus with mild medial displacement resulting in approximately 4 mm of articular-surface diastasis. Comminuted intra-articular fracture of the tibial plafond and with vertical extension into the distal metaphysis. Approximately 5 mm of articular-surface depression and diastasis. Transversely oriented fracture through the distal fibular metaphysis without significant displacement. Medial subluxation of the talar dome relative to the distal tibia without dislocation.   Bones of the hindfoot and midfoot are intact without fracture or malalignment. Joint spaces are preserved.   Ligaments   Suboptimally assessed by CT.   Muscles and Tendons   No acute musculotendinous abnormality by CT although position of the tibialis posterior tendon and flexor digitorum longus tendons could be at risk for entrapment.   Soft tissues   Soft tissue swelling with ill-defined hematoma about the ankle, more pronounced medially. No soft tissue air to suggest open fracture.   IMPRESSION: 1. Acute, complex trimalleolar fracture of the left ankle, as described above. 2. Medial subluxation of the talar dome relative to the distal tibia without dislocation. 3. Soft tissue swelling with ill-defined hematoma about  the ankle, more pronounced medially.     Electronically Signed   By: Duanne Guess D.O.   On: 01/02/2024 14:54    PATIENT SURVEYS:  LEFS NT   COGNITION: Overall cognitive status: Within functional limits for tasks assessed     SENSATION: Light touch: Impaired   EDEMA:  Figure 8: NT    POSTURE: No Significant postural limitations  PALPATION: Dorsal side of right DIPS TTP   LOWER EXTREMITY ROM:  A/PROM Right eval Left eval  Hip flexion    Hip extension    Hip abduction    Hip adduction    Hip internal rotation    Hip external rotation    Knee flexion    Knee extension    Ankle dorsiflexion 10/12 5/7  Ankle plantarflexion 50 8/9  Ankle inversion 35 10/12  Ankle eversion 15 5/5   (Blank rows = not tested)    LOWER EXTREMITY MMT:  MMT Right eval Left eval  Hip flexion    Hip extension    Hip abduction    Hip adduction    Hip internal rotation    Hip external rotation    Knee flexion    Knee extension    Ankle dorsiflexion (Long  Sitting) 4+ 3+  Ankle plantarflexion (Long Sitting) 4+ 4-  Ankle inversion 4+ 3+  Ankle eversion 4+ 3+   (Blank rows = not tested)   FUNCTIONAL TESTS:  None performed   GAIT:  N/a; non-weight bearing on left foot                                                                                                                                 TREATMENT DATE:   03/07/24: All performed on LLE   THEREX  PF/DF  on theraband wobble board 1 x 30    Review of toe touch weight bearing with walker.  [email or web address] 10 ft x 4    Lateral weight shift onto foam pad with BUE support 1 x 5 -Pt needed to stop early due to increased pain and discomfort in LLE   Seated Soleus Stretch on LLE 2 X 30 SEC   02/25/24: All exercises performed on LLE  THEREX  Nu-Step seat and arms at 7 for 5 min with towel under left heel to make foot contact pedal.  Long Sitting PF with red band 3 x 10 use of cold  pack on ankle -Pt reports increased left foot pain   Self Care  Edema Massage  Scar Tissue Massage    02/23/24: All exercises performed on LLE  Ankle Circles Clockwise 1 x 20    Ankle Pumps  1 X 20  PF Stretch 3 x 60 sec    PATIENT EDUCATION:  Education details: Form and technique for correct performance of exercise. Person educated: Patient Education method: Explanation, Demonstration, Verbal cues, and Handouts Education comprehension: verbalized understanding and returned demonstration  HOME EXERCISE PROGRAM: Access Code: [email or web address] URL: [email or web address] Date: 03/07/2024 Prepared by: Ellin Goodie  Exercises - Seated Soleus Stretch with Strap  - 1 x daily - 7 x weekly - 3 reps - 30-60 sec  hold - Supine Ankle Circles  - 1 x daily - 7 x weekly - 3 sets - 10 reps - Supine Single Leg Ankle Pumps  - 1 x daily - 7 x weekly - 3 sets - 20 reps - Long Sitting Calf Stretch with Strap  - 1 x daily - 3 reps - 30-60 sec sec  hold - Long Sitting Ankle Plantar Flexion with Resistance  - 3-4 x weekly - 3 sets - 10 reps  Patient Education - Scar Massage  ASSESSMENT:  CLINICAL IMPRESSION: Pt presents s/p 8 weeks ORIF of left ankle. Pt was limited by left ankle pain during session. Despite pain, she did show an improvement in mobility with ability to perform toe touch weight bearing with use of walker with minimal cueing. PT recommends that pt take pain medication before session for better activity tolerance. She will continue to benefit from skilled PT to address these aforementioned deficits to return to walking and weight bearing on left ankle to return to job and performing self care and household tasks.  OBJECTIVE IMPAIRMENTS: Abnormal gait, difficulty walking, decreased ROM, decreased strength, hypomobility, increased edema, impaired sensation, and pain.   ACTIVITY LIMITATIONS: carrying, lifting, bending, standing, squatting, stairs, bathing, toileting, dressing,  hygiene/grooming, locomotion level, and caring for others  PARTICIPATION LIMITATIONS: cleaning, laundry, driving, shopping, community activity, and occupation  PERSONAL FACTORS: Age, Education, Fitness, Profession, and Time since onset of injury/illness/exacerbation are also affecting patient's functional outcome.   REHAB POTENTIAL: Good  CLINICAL DECISION MAKING: Stable/uncomplicated  EVALUATION COMPLEXITY: Low   GOALS: Goals reviewed with patient? No  SHORT TERM GOALS: Target date: 03/08/2024  Patient will demonstrate undestanding of home exercise plan by performing exercises correctly with evidence of good carry over with min to no verbal or tactile cues when demonstrating exercises. Baseline: NT 03/07/24: Performing exercises independently  Goal status: ACHIEVED    2.  Patient will be able to perform partial weight bearing exercises on LLE as evidence of ongoing healing and improved function in left ankle.  Baseline: Unable to weight bear on left ankle until March 12th  Goal status: ONGOING    LONG TERM GOALS: Target date: 05/03/2024  Patient will improve her LEFS score by >=9 pt to demonstrate a statistically significant improvement in her right ankle function to be able to walk longer distances to collect items for her job. (Binkley et al 1999) Baseline: NT             Goal status: ONGOING    2.  Patient will demonstrate improved left ankle AROM with range of motion within 20% of right ankle to improve ankle mechanics to ambulate without limitations.  Baseline: Ankle DF R/L 10/5, Ankle PF 50/8, Ankle Inv R/L 35/10, Ankle Ev R/L 15/5  Goal status: ONGOING   3.  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  Patient will improve left ankle  strength to be near symmetrical to right ankle for improved left ankle function to be able to normalize gait and return to weight bearing activities for her job.  Baseline: Ankle DF R/L 4+/3+, Ankle PF R/L 4+/4-, Ankle Ever R/L 4+/4- , Ankle Inv R/L 4+/4-  Goal status: ONGOING      PLAN:  PT FREQUENCY: 1-2x/week  PT DURATION: 10 weeks  PLANNED INTERVENTIONS: 97164- PT Re-evaluation, 97110-Therapeutic exercises, 97530- Therapeutic activity, 97112- Neuromuscular re-education, 97535- Self Care, 40981- Manual therapy, [phone]- Gait training, [phone]- Orthotic Fit/training, [phone]- Aquatic Therapy, [phone]- Electrical stimulation (unattended), [phone]- Electrical stimulation (manual), 97597- Wound care (first 20 sq cm), Patient/Family education, Balance training, Stair training, Dry Needling, Joint mobilization, Joint manipulation, Spinal manipulation, Spinal mobilization, Scar mobilization, DME instructions, Cryotherapy, and Moist heat  PLAN FOR NEXT SESSION: Take LEFS survery. Progress exercises including weight shifts. OMEGA plantarflexion on leg press    Ellin Goodie PT, DPT  [facility] [address] [place], [address] Phone: [phone]   Fax:  [phone]

## 2024-03-10 ENCOUNTER — Ambulatory Visit: Admitting: Physical Therapy

## 2024-03-10 DIAGNOSIS — M25572 Pain in left ankle and joints of left foot: Secondary | ICD-10-CM | POA: Diagnosis not present

## 2024-03-10 NOTE — Therapy (Signed)
 OUTPATIENT PHYSICAL THERAPY LOWER EXTREMITY TREATMENT    Patient Name: [name] MRN: [id number] DOB:06/20/1961, 63 y.o., female Today's Date: 03/10/2024  END OF SESSION:  PT End of Session - 03/10/24 1527     Visit Number 4    Number of Visits 20    Date for PT Re-Evaluation 05/03/24    Authorization Type [employer] [id number]    Authorization - Visit Number 4    Authorization - Number of Visits 20    Progress Note Due on Visit 10    PT Start Time 1300    PT Stop Time 1345    PT Time Calculation (min) 45 min    Activity Tolerance Patient tolerated treatment well    Behavior During Therapy WFL for tasks assessed/performed              Past Medical History:  Diagnosis Date   Complication of anesthesia    PONV (postoperative nausea and vomiting)    Past Surgical History:  Procedure Laterality Date   AUGMENTATION MAMMAPLASTY Bilateral 2010   BICEPT TENODESIS Right 09/24/2021   Procedure: BICEPS TENODESIS;  Surgeon: Cammy Copa, MD;  Location: [facility] OR;  Service: Orthopedics;  Laterality: Right;   CERVICAL SPINE SURGERY  2003   C5-C7- double dissectomy   PAROTID GLAND TUMOR EXCISION     SHOULDER ARTHROSCOPY Right 09/24/2021   Procedure: right shoulder arthroscopy, debridement,open rotator cuff tear repair;  Surgeon: Cammy Copa, MD;  Location: [facility] OR;  Service: Orthopedics;  Laterality: Right;   WISDOM TOOTH EXTRACTION Bilateral    Patient Active Problem List   Diagnosis Date Noted   Elevated low density lipoprotein (LDL) cholesterol level 08/01/2023   Complete tear of right rotator cuff    Degenerative superior labral anterior-to-posterior (SLAP) tear of right shoulder    Healthcare maintenance 07/31/2021   Congenital pes cavus 02/25/2021   History of uterine fibroid 01/05/2021   Herpes 07/12/2020   GERD (gastroesophageal reflux disease) 05/23/2020   Vaginal atrophy 05/23/2020   Cervical spine arthritis 05/23/2020   Osteopenia 05/23/2020   Insomnia 05/20/2020    Postmenopausal hormone therapy 05/20/2020    PCP: Dr. Aura Dials   REFERRING PROVIDER: Dr. Dub Mikes   REFERRING DIAG: Left Ankle Fracture   THERAPY DIAG:  No diagnosis found.  Rationale for Evaluation and Treatment: Rehabilitation  ONSET DATE: 01/05/24 (Surgery Date)   SUBJECTIVE:   SUBJECTIVE STATEMENT: Pt reports that she was unable to see her note from March 17th in myChart. She has been doing better with left ankle mobility with ability to perform exercises with heel contact.   PERTINENT HISTORY: Pt reports sustaining a left ankle injury on January 11th after sledding into a tree. She sustained a pylon fracture of left ankle and she has since been non-weight bearing in post operative boot. He wounds have healed since and she is experiencing swelling in left foot but this has also reduced since initial surgery. She is to remain non-weight bearing on left ankle until March 12th.  PAIN:  Are you having pain? Yes: NPRS scale: 3/10  Pain location: Medial and lateral left ankle  Pain description: Achy  Aggravating factors: When moving ankle  Relieving factors: Keeping ankle still   PRECAUTIONS: None  RED FLAGS: None   WEIGHT BEARING RESTRICTIONS: Yes Non-weight bearing and needs to use post surgical boot and scooter until March 12th   FALLS:  Has patient fallen in last 6 months? No  LIVING ENVIRONMENT: Lives with: lives with their spouse Lives  in: House/apartment Stairs:  Did not ask  Has following equipment at home: shower chair and Scooter   OCCUPATION: Nurse; works at an outpatient clinic   PLOF: Independent  PATIENT GOALS: To return to walking like she did before the accident.   NEXT MD VISIT: Not sure, end of March   OBJECTIVE:  Note: Objective measures were completed at Evaluation unless otherwise noted.  VITALS:  BP 108/52  HR 93 SpO2 99   DIAGNOSTIC FINDINGS:   CLINICAL DATA:  Ankle trauma, fracture, xray done (Age >= 5y)   EXAM: CT  OF THE LEFT ANKLE WITHOUT CONTRAST   TECHNIQUE: Multidetector CT imaging of the left ankle was performed according to the standard protocol. Multiplanar CT image reconstructions were also generated.   RADIATION DOSE REDUCTION: This exam was performed according to the departmental dose-optimization program which includes automated exposure control, adjustment of the mA and/or kV according to patient size and/or use of iterative reconstruction technique.   COMPARISON:  None Available.   FINDINGS: Bones/Joint/Cartilage   Acute, complex trimalleolar fracture of the left ankle. Vertically oriented fracture through the base of the medial malleolus with mild medial displacement resulting in approximately 4 mm of articular-surface diastasis. Comminuted intra-articular fracture of the tibial plafond and with vertical extension into the distal metaphysis. Approximately 5 mm of articular-surface depression and diastasis. Transversely oriented fracture through the distal fibular metaphysis without significant displacement. Medial subluxation of the talar dome relative to the distal tibia without dislocation.   Bones of the hindfoot and midfoot are intact without fracture or malalignment. Joint spaces are preserved.   Ligaments   Suboptimally assessed by CT.   Muscles and Tendons   No acute musculotendinous abnormality by CT although position of the tibialis posterior tendon and flexor digitorum longus tendons could be at risk for entrapment.   Soft tissues   Soft tissue swelling with ill-defined hematoma about the ankle, more pronounced medially. No soft tissue air to suggest open fracture.   IMPRESSION: 1. Acute, complex trimalleolar fracture of the left ankle, as described above. 2. Medial subluxation of the talar dome relative to the distal tibia without dislocation. 3. Soft tissue swelling with ill-defined hematoma about the ankle, more pronounced medially.      Electronically Signed   By: Duanne Guess D.O.   On: 01/02/2024 14:54    PATIENT SURVEYS:  LEFS NT   COGNITION: Overall cognitive status: Within functional limits for tasks assessed     SENSATION: Light touch: Impaired   EDEMA:  Figure 8: NT    POSTURE: No Significant postural limitations  PALPATION: Dorsal side of right DIPS TTP   LOWER EXTREMITY ROM:  A/PROM Right eval Left eval  Hip flexion    Hip extension    Hip abduction    Hip adduction    Hip internal rotation    Hip external rotation    Knee flexion    Knee extension    Ankle dorsiflexion 10/12 5/7  Ankle plantarflexion 50 8/9  Ankle inversion 35 10/12  Ankle eversion 15 5/5   (Blank rows = not tested)    LOWER EXTREMITY MMT:  MMT Right eval Left eval  Hip flexion    Hip extension    Hip abduction    Hip adduction    Hip internal rotation    Hip external rotation    Knee flexion    Knee extension    Ankle dorsiflexion (Long Sitting) 4+ 3+  Ankle plantarflexion (Long Sitting) 4+ 4-  Ankle  inversion 4+ 3+  Ankle eversion 4+ 3+   (Blank rows = not tested)   FUNCTIONAL TESTS:  None performed   GAIT:  N/a; non-weight bearing on left foot                                                                                                                                 TREATMENT DATE:   03/10/24: THEREX  BAPS level 2   -PF/DF x 30  -Ev/Inv x 30  -Counter Clockwise x 10  -Clockwise x 10   LEFS [place]: 11/80   THERAPEUTIC ACTIVITY  Donning boot for all weight bearing activity  Lateral Weight Shifts with BUE support 1 x 10  - min VC to stop full weight shift because of pain in heel.  Forward and Backward weight shift with BUE support  2 x 10  - RLE is the posterior foot  PWB with midfoot strike on LLE 5 ft x 2   -mod VC to decrease step length to avoid increase in LLE pain  03/07/24: All performed on LLE   THEREX  PF/DF  on theraband wobble board 1 x 30    Review of toe  touch weight bearing with walker.  [email or web address] 10 ft x 4    Lateral weight shift onto foam pad with BUE support 1 x 5 -Pt needed to stop early due to increased pain and discomfort in LLE   Seated Soleus Stretch on LLE 2 X 30 SEC   02/25/24: All exercises performed on LLE  THEREX  Nu-Step seat and arms at 7 for 5 min with towel under left heel to make foot contact pedal.  Long Sitting PF with red band 3 x 10 use of cold pack on ankle -Pt reports increased left foot pain   Self Care  Edema Massage  Scar Tissue Massage    02/23/24: All exercises performed on LLE  Ankle Circles Clockwise 1 x 20    Ankle Pumps  1 X 20  PF Stretch 3 x 60 sec    PATIENT EDUCATION:  Education details: Form and technique for correct performance of exercise. Person educated: Patient Education method: Explanation, Demonstration, Verbal cues, and Handouts Education comprehension: verbalized understanding and returned demonstration  HOME EXERCISE PROGRAM: Access Code: [email or web address] URL: [email or web address] Date: 03/10/2024 Prepared by: Ellin Goodie  Exercises - Seated Soleus Stretch with Strap  - 1 x daily - 7 x weekly - 3 reps - 30-60 sec  hold - Supine Ankle Circles  - 1 x daily - 7 x weekly - 3 sets - 10 reps - Long Sitting Calf Stretch with Strap  - 1 x daily - 3 reps - 30-60 sec sec  hold - Long Sitting Ankle Plantar Flexion with Resistance  - 3-4 x weekly - 3 sets - 10 reps -Lateral Weight shifts 7 x weekly 2 sets -10 reps  -Forward/ Backward Weight Shifts  7 x weekly 2 sets -10  reps     -Right foot is back foot  Patient Education - Scar Massage  ASSESSMENT:  CLINICAL IMPRESSION: Pt presents s/p 10 weeks ORIF of left ankle. She shows improvement with pain, left ankle pain is decreased as evidenced by ability to perform weight shifts on midfoot without needing to stop exercises. She is unable to complete full weight bearing with extreme pain  centered around heel and she demonstrates decreased left ankle function as evidenced by her low LEFS score. She will continue to benefit from skilled PT to address these aforementioned deficits to return to walking and weight bearing on left ankle to return to job and performing self care and household tasks.   OBJECTIVE IMPAIRMENTS: Abnormal gait, difficulty walking, decreased ROM, decreased strength, hypomobility, increased edema, impaired sensation, and pain.   ACTIVITY LIMITATIONS: carrying, lifting, bending, standing, squatting, stairs, bathing, toileting, dressing, hygiene/grooming, locomotion level, and caring for others  PARTICIPATION LIMITATIONS: cleaning, laundry, driving, shopping, community activity, and occupation  PERSONAL FACTORS: Age, Education, Fitness, Profession, and Time since onset of injury/illness/exacerbation are also affecting patient's functional outcome.   REHAB POTENTIAL: Good  CLINICAL DECISION MAKING: Stable/uncomplicated  EVALUATION COMPLEXITY: Low   GOALS: Goals reviewed with patient? No  SHORT TERM GOALS: Target date: 03/08/2024  Patient will demonstrate undestanding of home exercise plan by performing exercises correctly with evidence of good carry over with min to no verbal or tactile cues when demonstrating exercises. Baseline: NT 03/07/24: Performing exercises independently  Goal status: ACHIEVED    2.  Patient will be able to perform partial weight bearing exercises on LLE as evidence of ongoing healing and improved function in left ankle.  Baseline: Unable to weight bear on left ankle until March 12th  Goal status: ONGOING    LONG TERM GOALS: Target date: 05/03/2024  Patient will improve her LEFS score by >=9 pt to demonstrate a statistically significant improvement in her right ankle function to be able to walk longer distances to collect items for her job. Lujean Rave et al 1610) Baseline: 11/80             Goal status: ONGOING    2.  Patient  will demonstrate improved left ankle AROM with range of motion within 20% of right ankle to improve ankle mechanics to ambulate without limitations.  Baseline: Ankle DF R/L 10/5, Ankle PF 50/8, Ankle Inv R/L 35/10, Ankle Ev R/L 15/5  Goal status: ONGOING   3.  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  Patient will improve left ankle strength to be near symmetrical to right ankle for improved left ankle function to be able to normalize gait and return to weight bearing activities for her job.  Baseline: Ankle DF R/L 4+/3+, Ankle PF R/L 4+/4-, Ankle Ever R/L 4+/4- , Ankle Inv R/L 4+/4-  Goal status: ONGOING      PLAN:  PT FREQUENCY: 1-2x/week  PT DURATION: 10 weeks  PLANNED INTERVENTIONS: 97164- PT Re-evaluation, 97110-Therapeutic exercises, 97530- Therapeutic activity, 97112- Neuromuscular re-education, 97535- Self Care, 96045- Manual therapy, [phone]- Gait training, [phone]- Orthotic Fit/training, [phone]- Aquatic Therapy, [phone]- Electrical stimulation (unattended), [phone]- Electrical stimulation (manual), [phone]- Wound care (first 20 sq cm), Patient/Family education, Balance training, Stair training, Dry Needling, Joint mobilization, Joint manipulation, Spinal manipulation, Spinal mobilization,  Scar mobilization, DME instructions, Cryotherapy, and Moist heat  PLAN FOR NEXT SESSION: Seated heel raises and ongoing progression of weight shifts and weight bearing while walking using 2WW   Ellin Goodie PT, DPT  [facility] [address], [place], [address] Phone: [phone]   Fax:  [phone]

## 2024-03-14 ENCOUNTER — Ambulatory Visit: Admitting: Physical Therapy

## 2024-03-14 DIAGNOSIS — M25572 Pain in left ankle and joints of left foot: Secondary | ICD-10-CM | POA: Diagnosis not present

## 2024-03-14 NOTE — Therapy (Addendum)
 OUTPATIENT PHYSICAL THERAPY LOWER EXTREMITY TREATMENT    Patient Name: [name] MRN: [id number] DOB:[birth year], 63 y.o., female Today's Date: 03/14/2024  END OF SESSION:  PT End of Session - 03/14/24 1032     Visit Number 5    Number of Visits 20    Date for PT Re-Evaluation 05/03/24    Authorization Type [id number]    Authorization - Visit Number 5    Authorization - Number of Visits 20    Progress Note Due on Visit 10    PT Start Time 1030    PT Stop Time 1115    PT Time Calculation (min) 45 min    Activity Tolerance Patient tolerated treatment well    Behavior During Therapy WFL for tasks assessed/performed               Past Medical History:  Diagnosis Date   Complication of anesthesia    PONV (postoperative nausea and vomiting)    Past Surgical History:  Procedure Laterality Date   AUGMENTATION MAMMAPLASTY Bilateral 2010   BICEPT TENODESIS Right 09/24/2021   Procedure: BICEPS TENODESIS;  Surgeon: Cammy Copa, MD;  Location: [facility] OR;  Service: Orthopedics;  Laterality: Right;   CERVICAL SPINE SURGERY  2003   C5-C7- double dissectomy   PAROTID GLAND TUMOR EXCISION     SHOULDER ARTHROSCOPY Right 09/24/2021   Procedure: right shoulder arthroscopy, debridement,open rotator cuff tear repair;  Surgeon: Cammy Copa, MD;  Location: [facility] OR;  Service: Orthopedics;  Laterality: Right;   WISDOM TOOTH EXTRACTION Bilateral    Patient Active Problem List   Diagnosis Date Noted   Elevated low density lipoprotein (LDL) cholesterol level 08/01/2023   Complete tear of right rotator cuff    Degenerative superior labral anterior-to-posterior (SLAP) tear of right shoulder    Healthcare maintenance 07/31/2021   Congenital pes cavus 02/25/2021   History of uterine fibroid 01/05/2021   Herpes 07/12/2020   GERD (gastroesophageal reflux disease) 05/23/2020   Vaginal atrophy 05/23/2020   Cervical spine arthritis 05/23/2020   Osteopenia 05/23/2020   Insomnia  05/20/2020   Postmenopausal hormone therapy 05/20/2020    PCP: Dr. Aura Dials   REFERRING PROVIDER: Dr. Dub Mikes   REFERRING DIAG: Left Ankle Fracture   THERAPY DIAG:  No diagnosis found.  Rationale for Evaluation and Treatment: Rehabilitation  ONSET DATE: 01/05/24 (Surgery Date)   SUBJECTIVE:   SUBJECTIVE STATEMENT: She reports some improvement in her ability to weight bear on right ankle. She continues to have increase pain in the bottom of the heel and the side of her foot especially when placing full weight on left foot.   PERTINENT HISTORY: Pt reports sustaining a left ankle injury on January 11th after sledding into a tree. She sustained a pylon fracture of left ankle and she has since been non-weight bearing in post operative boot. He wounds have healed since and she is experiencing swelling in left foot but this has also reduced since initial surgery. She is to remain non-weight bearing on left ankle until March 12th.  PAIN:  Are you having pain? Yes: NPRS scale: 3/10  Pain location: Medial and lateral left ankle  Pain description: Achy  Aggravating factors: When moving ankle  Relieving factors: Keeping ankle still   PRECAUTIONS: None  RED FLAGS: None   WEIGHT BEARING RESTRICTIONS: Yes Non-weight bearing and needs to use post surgical boot and scooter until March 12th   FALLS:  Has patient fallen in last 6 months? No  LIVING  ENVIRONMENT: Lives with: lives with their spouse Lives in: House/apartment Stairs:  Did not ask  Has following equipment at home: shower chair and Scooter   OCCUPATION: Nurse; works at an outpatient clinic   PLOF: Independent  PATIENT GOALS: To return to walking like she did before the accident.   NEXT MD VISIT: Not sure, end of March   OBJECTIVE:  Note: Objective measures were completed at Evaluation unless otherwise noted.  VITALS:  BP 108/52  HR 93 SpO2 99   DIAGNOSTIC FINDINGS:   CLINICAL DATA:  Ankle trauma,  fracture, xray done (Age >= 5y)   EXAM: CT OF THE LEFT ANKLE WITHOUT CONTRAST   TECHNIQUE: Multidetector CT imaging of the left ankle was performed according to the standard protocol. Multiplanar CT image reconstructions were also generated.   RADIATION DOSE REDUCTION: This exam was performed according to the departmental dose-optimization program which includes automated exposure control, adjustment of the mA and/or kV according to patient size and/or use of iterative reconstruction technique.   COMPARISON:  None Available.   FINDINGS: Bones/Joint/Cartilage   Acute, complex trimalleolar fracture of the left ankle. Vertically oriented fracture through the base of the medial malleolus with mild medial displacement resulting in approximately 4 mm of articular-surface diastasis. Comminuted intra-articular fracture of the tibial plafond and with vertical extension into the distal metaphysis. Approximately 5 mm of articular-surface depression and diastasis. Transversely oriented fracture through the distal fibular metaphysis without significant displacement. Medial subluxation of the talar dome relative to the distal tibia without dislocation.   Bones of the hindfoot and midfoot are intact without fracture or malalignment. Joint spaces are preserved.   Ligaments   Suboptimally assessed by CT.   Muscles and Tendons   No acute musculotendinous abnormality by CT although position of the tibialis posterior tendon and flexor digitorum longus tendons could be at risk for entrapment.   Soft tissues   Soft tissue swelling with ill-defined hematoma about the ankle, more pronounced medially. No soft tissue air to suggest open fracture.   IMPRESSION: 1. Acute, complex trimalleolar fracture of the left ankle, as described above. 2. Medial subluxation of the talar dome relative to the distal tibia without dislocation. 3. Soft tissue swelling with ill-defined hematoma about the  ankle, more pronounced medially.     Electronically Signed   By: Duanne Guess D.O.   On: 01/02/2024 14:54    PATIENT SURVEYS:  LEFS NT   COGNITION: Overall cognitive status: Within functional limits for tasks assessed     SENSATION: Light touch: Impaired   EDEMA:  Figure 8: NT    POSTURE: No Significant postural limitations  PALPATION: Dorsal side of right DIPS TTP   LOWER EXTREMITY ROM:  A/PROM Right eval Left eval  Hip flexion    Hip extension    Hip abduction    Hip adduction    Hip internal rotation    Hip external rotation    Knee flexion    Knee extension    Ankle dorsiflexion 10/12 5/7  Ankle plantarflexion 50 8/9  Ankle inversion 35 10/12  Ankle eversion 15 5/5   (Blank rows = not tested)    LOWER EXTREMITY MMT:  MMT Right eval Left eval  Hip flexion    Hip extension    Hip abduction    Hip adduction    Hip internal rotation    Hip external rotation    Knee flexion    Knee extension    Ankle dorsiflexion (Long Sitting) 4+ 3+  Ankle plantarflexion (Long Sitting) 4+ 4-  Ankle inversion 4+ 3+  Ankle eversion 4+ 3+   (Blank rows = not tested)   FUNCTIONAL TESTS:  None performed   GAIT:  N/a; non-weight bearing on left foot                                                                                                                                 TREATMENT DATE:   03/14/24:  THEREX  BAPS level 3   -PF/DF x 30  -Ev/Inv x 30  -Counter Clockwise x 10  -Clockwise x 10   Seated Heel Raises on LLE #10 DB 1 x 10  Seated Heel Raises on LLE #15 DB 1 x 5 -Pt reports increased pain to 8/10 NRPS  Long Sitting PF with green band 1 x 10  Figure 4 left ankle inversion AROM 1 x 10  Figure 4 left ankle inversion yellow band 2 x 10   THERAPEUTIC ACTIVITY: Foam placed on LLE  Posterior and anterior weight shift  on LLE 2 x 10  Sit to Stand with BUE support using 2WW 2 x 10     03/10/24: THEREX  BAPS level 2   -PF/DF x 30   -Ev/Inv x 30  -Counter Clockwise x 10  -Clockwise x 10   LEFS [place]: 11/80   THERAPEUTIC ACTIVITY  Donning boot for all weight bearing activity  Lateral Weight Shifts with BUE support 1 x 10  - min VC to stop full weight shift because of pain in heel.  Forward and Backward weight shift with BUE support  2 x 10  - RLE is the posterior foot  PWB with midfoot strike on LLE 5 ft x 2   -mod VC to decrease step length to avoid increase in LLE pain  03/07/24: All performed on LLE   THEREX  PF/DF  on theraband wobble board 1 x 30    Review of toe touch weight bearing with walker.  [email or web address] 10 ft x 4    Lateral weight shift onto foam pad with BUE support 1 x 5 -Pt needed to stop early due to increased pain and discomfort in LLE   Seated Soleus Stretch on LLE 2 X 30 SEC    PATIENT EDUCATION:  Education details: Form and technique for correct performance of exercise. Person educated: Patient Education method: Explanation, Demonstration, Verbal cues, and Handouts Education comprehension: verbalized understanding and returned demonstration  HOME EXERCISE PROGRAM: Access Code: [email or web address] URL: [email or web address] Date: 03/14/2024 Prepared by: Ellin Goodie  Exercises - Supine Ankle Circles  - 1 x daily - 7 x weekly - 3 sets - 10 reps - Seated Soleus Stretch with Strap  - 1 x daily - 7 x weekly - 3 reps - 30-60 sec  hold - Long Sitting Calf Stretch with Strap  - 1 x daily - 3 reps - 30-60 sec sec  hold - Long Sitting Ankle Plantar Flexion  with Resistance  - 3-4 x weekly - 3 sets - 10 reps - Seated Heel Raise  - 3-4 x weekly - 3 sets - 10 reps - Seated Figure 4 Ankle Inversion with Resistance  - 3-4 x weekly - 3 sets - 10 reps - Sit to Stand  - 3-4 x weekly - 3 sets - 10 reps  Patient Education - Scar Massage  ASSESSMENT:  CLINICAL IMPRESSION: Pt presents s/p 11 weeks ORIF of left ankle. Pt demonstrates ongoing  limitations to weight bearing on LLE due to increased pain. However, despite this, she was able to demonstrate improved weight bearing during the session with ability to perform sit to stands with BUE support and increased cushion with foam. She will continue to benefit from skilled PT to address these aforementioned deficits to return to walking and weight bearing on left ankle to return to job and performing self care and household tasks.   OBJECTIVE IMPAIRMENTS: Abnormal gait, difficulty walking, decreased ROM, decreased strength, hypomobility, increased edema, impaired sensation, and pain.   ACTIVITY LIMITATIONS: carrying, lifting, bending, standing, squatting, stairs, bathing, toileting, dressing, hygiene/grooming, locomotion level, and caring for others  PARTICIPATION LIMITATIONS: cleaning, laundry, driving, shopping, community activity, and occupation  PERSONAL FACTORS: Age, Education, Fitness, Profession, and Time since onset of injury/illness/exacerbation are also affecting patient's functional outcome.   REHAB POTENTIAL: Good  CLINICAL DECISION MAKING: Stable/uncomplicated  EVALUATION COMPLEXITY: Low   GOALS: Goals reviewed with patient? No  SHORT TERM GOALS: Target date: 03/08/2024  Patient will demonstrate undestanding of home exercise plan by performing exercises correctly with evidence of good carry over with min to no verbal or tactile cues when demonstrating exercises. Baseline: NT 03/07/24: Performing exercises independently  Goal status: ACHIEVED    2.  Patient will be able to perform partial weight bearing exercises on LLE as evidence of ongoing healing and improved function in left ankle.  Baseline: Unable to weight bear on left ankle until March 12th  Goal status: ONGOING    LONG TERM GOALS: Target date: 05/03/2024  Patient will improve her LEFS score by >=9 pt to demonstrate a statistically significant improvement in her right ankle function to be able to walk  longer distances to collect items for her job. Lujean Rave et al 1610) Baseline: 11/80             Goal status: ONGOING    2.  Patient will demonstrate improved left ankle AROM with range of motion within 20% of right ankle to improve ankle mechanics to ambulate without limitations.  Baseline: Ankle DF R/L 10/5, Ankle PF 50/8, Ankle Inv R/L 35/10, Ankle Ev R/L 15/5  Goal status: ONGOING   3.  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  Patient will improve left ankle strength to be near symmetrical to right ankle for improved left ankle function to be able to normalize gait and return to weight bearing activities for her job.  Baseline: Ankle DF R/L 4+/3+, Ankle PF R/L 4+/4-, Ankle Ever R/L 4+/4- , Ankle Inv R/L 4+/4-  Goal status: ONGOING      PLAN:  PT FREQUENCY: 1-2x/week  PT DURATION: 10 weeks  PLANNED INTERVENTIONS: 97164- PT Re-evaluation, 97110-Therapeutic exercises, 97530- Therapeutic activity, [id number]- Neuromuscular re-education, 97535- Self Care, 96045- Manual  therapy, [id number]- Gait training, 02725- Orthotic Fit/training, 36644- Aquatic Therapy, [phone]- Electrical stimulation (unattended), [phone]- Electrical stimulation (manual), [phone]- Wound care (first 20 sq cm), Patient/Family education, Balance training, Stair training, Dry Needling, Joint mobilization, Joint manipulation, Spinal manipulation, Spinal mobilization, Scar mobilization, DME instructions, Cryotherapy, and Moist heat  PLAN FOR NEXT SESSION: Reassess goals and continue to progress weight shifts and progress non-weight bearing resistance exercises.    Ellin Goodie PT, DPT  [facility] [address], [place], [address] Phone:  [phone]   Fax:  [phone]

## 2024-03-15 ENCOUNTER — Ambulatory Visit: Admitting: Physical Therapy

## 2024-03-15 DIAGNOSIS — M25572 Pain in left ankle and joints of left foot: Secondary | ICD-10-CM | POA: Diagnosis not present

## 2024-03-15 NOTE — Therapy (Signed)
 OUTPATIENT PHYSICAL THERAPY LOWER EXTREMITY TREATMENT    Patient Name: [name] MRN: [id number] DOB:May 18, 1961, 63 y.o., female Today's Date: 03/15/2024  END OF SESSION:  PT End of Session - 03/15/24 1652     Visit Number 6    Number of Visits 20    Date for PT Re-Evaluation 05/03/24    Authorization Type [id number]    Authorization - Visit Number 6    Authorization - Number of Visits 20    Progress Note Due on Visit 10    PT Start Time 1650    PT Stop Time 1730    PT Time Calculation (min) 40 min    Activity Tolerance Patient tolerated treatment well    Behavior During Therapy WFL for tasks assessed/performed               Past Medical History:  Diagnosis Date   Complication of anesthesia    PONV (postoperative nausea and vomiting)    Past Surgical History:  Procedure Laterality Date   AUGMENTATION MAMMAPLASTY Bilateral 2010   BICEPT TENODESIS Right 09/24/2021   Procedure: BICEPS TENODESIS;  Surgeon: Cammy Copa, MD;  Location: [facility] OR;  Service: Orthopedics;  Laterality: Right;   CERVICAL SPINE SURGERY  2003   C5-C7- double dissectomy   PAROTID GLAND TUMOR EXCISION     SHOULDER ARTHROSCOPY Right 09/24/2021   Procedure: right shoulder arthroscopy, debridement,open rotator cuff tear repair;  Surgeon: Cammy Copa, MD;  Location: [facility] OR;  Service: Orthopedics;  Laterality: Right;   WISDOM TOOTH EXTRACTION Bilateral    Patient Active Problem List   Diagnosis Date Noted   Elevated low density lipoprotein (LDL) cholesterol level 08/01/2023   Complete tear of right rotator cuff    Degenerative superior labral anterior-to-posterior (SLAP) tear of right shoulder    Healthcare maintenance 07/31/2021   Congenital pes cavus 02/25/2021   History of uterine fibroid 01/05/2021   Herpes 07/12/2020   GERD (gastroesophageal reflux disease) 05/23/2020   Vaginal atrophy 05/23/2020   Cervical spine arthritis 05/23/2020   Osteopenia 05/23/2020   Insomnia  05/20/2020   Postmenopausal hormone therapy 05/20/2020    PCP: Dr. Aura Dials   REFERRING PROVIDER: Dr. Dub Mikes   REFERRING DIAG: Left Ankle Fracture   THERAPY DIAG:  Pain in left ankle and joints of left foot  Rationale for Evaluation and Treatment: Rehabilitation  ONSET DATE: 01/05/24 (Surgery Date)   SUBJECTIVE:   SUBJECTIVE STATEMENT: She reports some improvement in her ability to weight bear on right ankle. She continues to have increase pain in the bottom of the heel and the side of her foot especially when placing full weight on left foot.   PERTINENT HISTORY: Pt reports sustaining a left ankle injury on January 11th after sledding into a tree. She sustained a pylon fracture of left ankle and she has since been non-weight bearing in post operative boot. He wounds have healed since and she is experiencing swelling in left foot but this has also reduced since initial surgery. She is to remain non-weight bearing on left ankle until March 12th.  PAIN:  Are you having pain? Yes: NPRS scale: 3/10  Pain location: Medial and lateral left ankle  Pain description: Achy  Aggravating factors: When moving ankle  Relieving factors: Keeping ankle still   PRECAUTIONS: None  RED FLAGS: None   WEIGHT BEARING RESTRICTIONS: Yes Non-weight bearing and needs to use post surgical boot and scooter until March 12th   FALLS:  Has patient fallen in  last 6 months? No  LIVING ENVIRONMENT: Lives with: lives with their spouse Lives in: House/apartment Stairs:  Did not ask  Has following equipment at home: shower chair and Scooter   OCCUPATION: Nurse; works at an outpatient clinic   PLOF: Independent  PATIENT GOALS: To return to walking like she did before the accident.   NEXT MD VISIT: Not sure, end of March   OBJECTIVE:  Note: Objective measures were completed at Evaluation unless otherwise noted.  VITALS:  BP 108/52  HR 93 SpO2 99   DIAGNOSTIC FINDINGS:   CLINICAL  DATA:  Ankle trauma, fracture, xray done (Age >= 5y)   EXAM: CT OF THE LEFT ANKLE WITHOUT CONTRAST   TECHNIQUE: Multidetector CT imaging of the left ankle was performed according to the standard protocol. Multiplanar CT image reconstructions were also generated.   RADIATION DOSE REDUCTION: This exam was performed according to the departmental dose-optimization program which includes automated exposure control, adjustment of the mA and/or kV according to patient size and/or use of iterative reconstruction technique.   COMPARISON:  None Available.   FINDINGS: Bones/Joint/Cartilage   Acute, complex trimalleolar fracture of the left ankle. Vertically oriented fracture through the base of the medial malleolus with mild medial displacement resulting in approximately 4 mm of articular-surface diastasis. Comminuted intra-articular fracture of the tibial plafond and with vertical extension into the distal metaphysis. Approximately 5 mm of articular-surface depression and diastasis. Transversely oriented fracture through the distal fibular metaphysis without significant displacement. Medial subluxation of the talar dome relative to the distal tibia without dislocation.   Bones of the hindfoot and midfoot are intact without fracture or malalignment. Joint spaces are preserved.   Ligaments   Suboptimally assessed by CT.   Muscles and Tendons   No acute musculotendinous abnormality by CT although position of the tibialis posterior tendon and flexor digitorum longus tendons could be at risk for entrapment.   Soft tissues   Soft tissue swelling with ill-defined hematoma about the ankle, more pronounced medially. No soft tissue air to suggest open fracture.   IMPRESSION: 1. Acute, complex trimalleolar fracture of the left ankle, as described above. 2. Medial subluxation of the talar dome relative to the distal tibia without dislocation. 3. Soft tissue swelling with ill-defined  hematoma about the ankle, more pronounced medially.     Electronically Signed   By: Duanne Guess D.O.   On: 01/02/2024 14:54    PATIENT SURVEYS:  LEFS NT   COGNITION: Overall cognitive status: Within functional limits for tasks assessed     SENSATION: Light touch: Impaired   EDEMA:  Figure 8: NT    POSTURE: No Significant postural limitations  PALPATION: Dorsal side of right DIPS TTP   LOWER EXTREMITY ROM:  A/PROM Right eval Left eval Left  03/15/24  Hip flexion     Hip extension     Hip abduction     Hip adduction     Hip internal rotation     Hip external rotation     Knee flexion     Knee extension     Ankle dorsiflexion 10/12 5/7 10  Ankle plantarflexion 50 8/9 40  Ankle inversion 35 10/12 20  Ankle eversion 15 5/5 20   (Blank rows = not tested)    LOWER EXTREMITY MMT:  MMT Right eval Left eval Left  03/15/24  Hip flexion     Hip extension     Hip abduction     Hip adduction     Hip  internal rotation     Hip external rotation     Knee flexion     Knee extension     Ankle dorsiflexion (Long Sitting) 4+ 3+ 4  Ankle plantarflexion (Long Sitting) 4+ 4- 4  Ankle inversion 4+ 3+ 4-  Ankle eversion 4+ 3+ 3+   (Blank rows = not tested)   FUNCTIONAL TESTS:  None performed   GAIT:  N/a; non-weight bearing on left foot                                                                                                                                 TREATMENT DATE:   03/15/24:  THEREX  BAPS level 3   -PF/DF x 30  -Ev/Inv x 30  -Counter Clockwise x 10  -Clockwise x 10   PHYSICAL PERFORMANCE    Ankle AROM  DF R 10  PF R 40  Inv R 20  Ev R 20   Ankle MMT  DF R 4 PF R 4   Inv R 4-  Ev R 3+  LEFS 16.3%   GAIT TRAINING  Partial weight bearing with 2WW 20 ft  -min VC to sequence steps and to utilize UE support   03/14/24:  THEREX  BAPS level 3   -PF/DF x 30  -Ev/Inv x 30  -Counter Clockwise x 10  -Clockwise x 10    Seated Heel Raises on LLE #10 DB 1 x 10  Seated Heel Raises on LLE #15 DB 1 x 5 -Pt reports increased pain to 8/10 NRPS  Long Sitting PF with green band 1 x 10  Figure 4 left ankle inversion AROM 1 x 10  Figure 4 left ankle inversion yellow band 2 x 10   THERAPEUTIC ACTIVITY: Foam placed on LLE  Posterior and anterior weight shift  on LLE 2 x 10  Sit to Stand with BUE support using 2WW 2 x 10     03/10/24: THEREX  BAPS level 2   -PF/DF x 30  -Ev/Inv x 30  -Counter Clockwise x 10  -Clockwise x 10   LEFS [place]: 11/80   THERAPEUTIC ACTIVITY  Donning boot for all weight bearing activity  Lateral Weight Shifts with BUE support 1 x 10  - min VC to stop full weight shift because of pain in heel.  Forward and Backward weight shift with BUE support  2 x 10  - RLE is the posterior foot  PWB with midfoot strike on LLE 5 ft x 2   -mod VC to decrease step length to avoid increase in LLE pain    PATIENT EDUCATION:  Education details: Form and technique for correct performance of exercise. Person educated: Patient Education method: Explanation, Demonstration, Verbal cues, and Handouts Education comprehension: verbalized understanding and returned demonstration  HOME EXERCISE PROGRAM: Access Code: [email or web address] URL: [email or web address] Date: 03/14/2024 Prepared by: Ellin Goodie  Exercises - Supine Ankle Circles  - 1 x daily -  7 x weekly - 3 sets - 10 reps - Seated Soleus Stretch with Strap  - 1 x daily - 7 x weekly - 3 reps - 30-60 sec  hold - Long Sitting Calf Stretch with Strap  - 1 x daily - 3 reps - 30-60 sec sec  hold - Long Sitting Ankle Plantar Flexion with Resistance  - 3-4 x weekly - 3 sets - 10 reps - Seated Heel Raise  - 3-4 x weekly - 3 sets - 10 reps - Seated Figure 4 Ankle Inversion with Resistance  - 3-4 x weekly - 3 sets - 10 reps - Sit to Stand  - 3-4 x weekly - 3 sets - 10 reps  Patient Education - Scar Massage  ASSESSMENT:  CLINICAL  IMPRESSION: Pt presents s/p 11 weeks ORIF of left ankle. Pt demonstrates improvement in left ankle mobility and strength as well as improvement in overall mobility with ability to perform partial weight bearing with midfoot strike with use of 2WW. She continues to be limited by pain during weight bearing exercises but does demonstrate improvement here as well with ability to perform ambulation for first time. She will continue to benefit from skilled PT to address these aforementioned deficits to return to walking and weight bearing on left ankle to return to job and performing self care and household tasks.   OBJECTIVE IMPAIRMENTS: Abnormal gait, difficulty walking, decreased ROM, decreased strength, hypomobility, increased edema, impaired sensation, and pain.   ACTIVITY LIMITATIONS: carrying, lifting, bending, standing, squatting, stairs, bathing, toileting, dressing, hygiene/grooming, locomotion level, and caring for others  PARTICIPATION LIMITATIONS: cleaning, laundry, driving, shopping, community activity, and occupation  PERSONAL FACTORS: Age, Education, Fitness, Profession, and Time since onset of injury/illness/exacerbation are also affecting patient's functional outcome.   REHAB POTENTIAL: Good  CLINICAL DECISION MAKING: Stable/uncomplicated  EVALUATION COMPLEXITY: Low   GOALS: Goals reviewed with patient? No  SHORT TERM GOALS: Target date: 03/08/2024  Patient will demonstrate undestanding of home exercise plan by performing exercises correctly with evidence of good carry over with min to no verbal or tactile cues when demonstrating exercises. Baseline: NT 03/07/24: Performing exercises independently  Goal status: ACHIEVED    2.  Patient will be able to perform partial weight bearing exercises on LLE as evidence of ongoing healing and improved function in left ankle.  Baseline: Unable to weight bear on left ankle until March 12th  Goal status: ONGOING    LONG TERM GOALS:  Target date: 05/03/2024  Patient will improve her LEFS score by >=9 pt to demonstrate a statistically significant improvement in her right ankle function to be able to walk longer distances to collect items for her job. Lujean Rave et al [id number]) Baseline: 11/80 03/16/24: 16.3 %             Goal status: ONGOING    2.  Patient will demonstrate improved left ankle AROM with range of motion within 20% of right ankle to improve ankle mechanics to ambulate without limitations.  Baseline: Ankle DF R/L 10/5, Ankle PF 50/8, Ankle Inv R/L 35/10, Ankle Ev R/L 15/5  03/15/24: DF R 10,PF R 40, Inv R 20, Ev R 20  Goal status: ONGOING   3.  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  Patient will improve left ankle strength to be near symmetrical to right ankle for improved left ankle function to be able to normalize gait and return to weight bearing activities for her job.  Baseline: Ankle DF R/L 4+/3+, Ankle PF R/L 4+/4-, Ankle Ever R/L 4+/4- , Ankle Inv R/L 4+/4- 03/15/24: DF R 4, PF R 4, Inv R 4-, Ev R 3+ Goal status: ONGOING      PLAN:  PT FREQUENCY: 1-2x/week  PT DURATION: 10 weeks  PLANNED INTERVENTIONS: 97164- PT Re-evaluation, 97110-Therapeutic exercises, 97530- Therapeutic activity, 97112- Neuromuscular re-education, 97535- Self Care, 16109- Manual therapy, [id number]- Gait training, [phone]- Orthotic Fit/training, [phone]- Aquatic Therapy, [phone]- Electrical stimulation (unattended), [phone]- Electrical stimulation (manual), 97597- Wound care (first 20 sq cm), Patient/Family education, Balance training, Stair training, Dry Needling, Joint mobilization, Joint manipulation, Spinal manipulation, Spinal  mobilization, Scar mobilization, DME instructions, Cryotherapy, and Moist heat  PLAN FOR NEXT SESSION: Progress weight shifting. Lateral weight shifts with UE support at mat   Ellin Goodie PT, DPT  [facility] [address] [place], [address] Phone: [phone]   Fax:  [phone]

## 2024-03-16 DIAGNOSIS — M25572 Pain in left ankle and joints of left foot: Secondary | ICD-10-CM | POA: Diagnosis not present

## 2024-03-21 ENCOUNTER — Ambulatory Visit: Admitting: Physical Therapy

## 2024-03-21 DIAGNOSIS — M25572 Pain in left ankle and joints of left foot: Secondary | ICD-10-CM | POA: Diagnosis not present

## 2024-03-21 NOTE — Therapy (Signed)
 OUTPATIENT PHYSICAL THERAPY LOWER EXTREMITY TREATMENT    Patient Name: Vonetta Foulk MRN: [id number] DOB:1961-06-18, 63 y.o., female Today's Date: 03/21/2024  END OF SESSION:  PT End of Session - 03/21/24 1039     Visit Number 7    Number of Visits 20    Date for PT Re-Evaluation 05/03/24    Authorization Type [id number]    Authorization - Visit Number 7    Authorization - Number of Visits 20    Progress Note Due on Visit 10    PT Start Time 1035    PT Stop Time 1115    PT Time Calculation (min) 40 min    Activity Tolerance Patient tolerated treatment well    Behavior During Therapy WFL for tasks assessed/performed               Past Medical History:  Diagnosis Date   Complication of anesthesia    PONV (postoperative nausea and vomiting)    Past Surgical History:  Procedure Laterality Date   AUGMENTATION MAMMAPLASTY Bilateral 2010   BICEPT TENODESIS Right 09/24/2021   Procedure: BICEPS TENODESIS;  Surgeon: Cammy Copa, MD;  Location: [facility] OR;  Service: Orthopedics;  Laterality: Right;   CERVICAL SPINE SURGERY  2003   C5-C7- double dissectomy   PAROTID GLAND TUMOR EXCISION     SHOULDER ARTHROSCOPY Right 09/24/2021   Procedure: right shoulder arthroscopy, debridement,open rotator cuff tear repair;  Surgeon: Cammy Copa, MD;  Location: [facility] OR;  Service: Orthopedics;  Laterality: Right;   WISDOM TOOTH EXTRACTION Bilateral    Patient Active Problem List   Diagnosis Date Noted   Elevated low density lipoprotein (LDL) cholesterol level 08/01/2023   Complete tear of right rotator cuff    Degenerative superior labral anterior-to-posterior (SLAP) tear of right shoulder    Healthcare maintenance 07/31/2021   Congenital pes cavus 02/25/2021   History of uterine fibroid 01/05/2021   Herpes 07/12/2020   GERD (gastroesophageal reflux disease) 05/23/2020   Vaginal atrophy 05/23/2020   Cervical spine arthritis 05/23/2020   Osteopenia 05/23/2020   Insomnia  05/20/2020   Postmenopausal hormone therapy 05/20/2020    PCP: Dr. Aura Dials   REFERRING PROVIDER: Dr. Dub Mikes   REFERRING DIAG: Left Ankle Fracture   THERAPY DIAG:  Pain in left ankle and joints of left foot  Rationale for Evaluation and Treatment: Rehabilitation  ONSET DATE: 01/05/24 (Surgery Date)   SUBJECTIVE:   SUBJECTIVE STATEMENT: Pt reports ongoing improvement in left ankle with increased weight bearing on left ankle.   PERTINENT HISTORY: Pt reports sustaining a left ankle injury on January 11th after sledding into a tree. She sustained a pylon fracture of left ankle and she has since been non-weight bearing in post operative boot. He wounds have healed since and she is experiencing swelling in left foot but this has also reduced since initial surgery. She is to remain non-weight bearing on left ankle until March 12th.  PAIN:  Are you having pain? Yes: NPRS scale: 3/10  Pain location: Medial and lateral left ankle  Pain description: Achy  Aggravating factors: When moving ankle  Relieving factors: Keeping ankle still   PRECAUTIONS: None  RED FLAGS: None   WEIGHT BEARING RESTRICTIONS: Yes Non-weight bearing and needs to use post surgical boot and scooter until March 12th   FALLS:  Has patient fallen in last 6 months? No  LIVING ENVIRONMENT: Lives with: lives with their spouse Lives in: House/apartment Stairs:  Did not ask  Has following equipment  at home: shower chair and Scooter   OCCUPATION: Nurse; works at an outpatient clinic   PLOF: Independent  PATIENT GOALS: To return to walking like she did before the accident.   NEXT MD VISIT: Not sure, end of March   OBJECTIVE:  Note: Objective measures were completed at Evaluation unless otherwise noted.  VITALS:  BP 108/52  HR 93 SpO2 99   DIAGNOSTIC FINDINGS:   CLINICAL DATA:  Ankle trauma, fracture, xray done (Age >= 5y)   EXAM: CT OF THE LEFT ANKLE WITHOUT CONTRAST    TECHNIQUE: Multidetector CT imaging of the left ankle was performed according to the standard protocol. Multiplanar CT image reconstructions were also generated.   RADIATION DOSE REDUCTION: This exam was performed according to the departmental dose-optimization program which includes automated exposure control, adjustment of the mA and/or kV according to patient size and/or use of iterative reconstruction technique.   COMPARISON:  None Available.   FINDINGS: Bones/Joint/Cartilage   Acute, complex trimalleolar fracture of the left ankle. Vertically oriented fracture through the base of the medial malleolus with mild medial displacement resulting in approximately 4 mm of articular-surface diastasis. Comminuted intra-articular fracture of the tibial plafond and with vertical extension into the distal metaphysis. Approximately 5 mm of articular-surface depression and diastasis. Transversely oriented fracture through the distal fibular metaphysis without significant displacement. Medial subluxation of the talar dome relative to the distal tibia without dislocation.   Bones of the hindfoot and midfoot are intact without fracture or malalignment. Joint spaces are preserved.   Ligaments   Suboptimally assessed by CT.   Muscles and Tendons   No acute musculotendinous abnormality by CT although position of the tibialis posterior tendon and flexor digitorum longus tendons could be at risk for entrapment.   Soft tissues   Soft tissue swelling with ill-defined hematoma about the ankle, more pronounced medially. No soft tissue air to suggest open fracture.   IMPRESSION: 1. Acute, complex trimalleolar fracture of the left ankle, as described above. 2. Medial subluxation of the talar dome relative to the distal tibia without dislocation. 3. Soft tissue swelling with ill-defined hematoma about the ankle, more pronounced medially.     Electronically Signed   By: Duanne Guess  D.O.   On: 01/02/2024 14:54    PATIENT SURVEYS:  LEFS NT   COGNITION: Overall cognitive status: Within functional limits for tasks assessed     SENSATION: Light touch: Impaired   EDEMA:  Figure 8: NT    POSTURE: No Significant postural limitations  PALPATION: Dorsal side of right DIPS TTP   LOWER EXTREMITY ROM:  A/PROM Right eval Left eval Left  03/15/24  Hip flexion     Hip extension     Hip abduction     Hip adduction     Hip internal rotation     Hip external rotation     Knee flexion     Knee extension     Ankle dorsiflexion 10/12 5/7 10  Ankle plantarflexion 50 8/9 40  Ankle inversion 35 10/12 20  Ankle eversion 15 5/5 20   (Blank rows = not tested)    LOWER EXTREMITY MMT:  MMT Right eval Left eval Left  03/15/24  Hip flexion     Hip extension     Hip abduction     Hip adduction     Hip internal rotation     Hip external rotation     Knee flexion     Knee extension  Ankle dorsiflexion (Long Sitting) 4+ 3+ 4  Ankle plantarflexion (Long Sitting) 4+ 4- 4  Ankle inversion 4+ 3+ 4-  Ankle eversion 4+ 3+ 3+   (Blank rows = not tested)   FUNCTIONAL TESTS:  None performed   GAIT:  N/a; non-weight bearing on left foot                                                                                                                                 TREATMENT DATE:   03/21/24:  THEREX   BAPS level 3   -PF/DF x 30  -Ev/Inv x 30  -Counter Clockwise x 10  -Clockwise x 10    LLE Standing Dorsiflexion Movement with Self Mobilization with posterior glide using theraband 2 x 10   THERAPEUTIC ACTIVITY  10 meter x 2 ambulation with BUE support and step gait pattern with progression to modified step through.  Modified Single Leg Stance on LLE with BUE support and use of 4 inch block 3 x 30 sec    03/15/24:  THEREX  BAPS level 3   -PF/DF x 30  -Ev/Inv x 30  -Counter Clockwise x 10  -Clockwise x 10   PHYSICAL PERFORMANCE    Ankle AROM   DF R 10  PF R 40  Inv R 20  Ev R 20   Ankle MMT  DF R 4 PF R 4   Inv R 4-  Ev R 3+  LEFS 16.3%   GAIT TRAINING  Partial weight bearing with 2WW 20 ft  -min VC to sequence steps and to utilize UE support   03/14/24:  THEREX  BAPS level 3   -PF/DF x 30  -Ev/Inv x 30  -Counter Clockwise x 10  -Clockwise x 10   Seated Heel Raises on LLE #10 DB 1 x 10  Seated Heel Raises on LLE #15 DB 1 x 5 -Pt reports increased pain to 8/10 NRPS  Long Sitting PF with green band 1 x 10  Figure 4 left ankle inversion AROM 1 x 10  Figure 4 left ankle inversion yellow band 2 x 10   THERAPEUTIC ACTIVITY: Foam placed on LLE  Posterior and anterior weight shift  on LLE 2 x 10  Sit to Stand with BUE support using 2WW 2 x 10    PATIENT EDUCATION:  Education details: Form and technique for correct performance of exercise. Person educated: Patient Education method: Explanation, Demonstration, Verbal cues, and Handouts Education comprehension: verbalized understanding and returned demonstration  HOME EXERCISE PROGRAM: Access Code: [email or web address] URL: [email or web address] Date: 03/21/2024 Prepared by: Ellin Goodie  Exercises - Supine Ankle Circles  - 1 x daily - 7 x weekly - 3 sets - 10 reps - Seated Soleus Stretch with Strap  - 1 x daily - 7 x weekly - 3 reps - 30-60 sec  hold - Long Sitting Calf Stretch with Strap  - 1 x daily - 3 reps - 30-60  sec sec  hold - Long Sitting Ankle Plantar Flexion with Resistance  - 3-4 x weekly - 3 sets - 10 reps - Seated Heel Raise  - 3-4 x weekly - 3 sets - 10 reps - Seated Figure 4 Ankle Inversion with Resistance  - 3-4 x weekly - 3 sets - 10 reps - Sit to Stand  - 3-4 x weekly - 3 sets - 10 reps - Kneeling Ankle Dorsiflexion Self-Mobilization with Towel  - 1 x daily - 7 x weekly - 2 sets - 10 reps  Patient Education - Scar Massage  ASSESSMENT:  CLINICAL IMPRESSION: Pt presents s/p 12 weeks ORIF of left ankle.  She shows marked  improvement with left ankle function with ability to perform increased weight bearing during walking with increased step length on RLE. Pt also able to perform standing modified single leg stance for the first time without a large increase in her left ankle pain. She will continue to benefit from skilled PT to address these aforementioned deficits to return to walking and weight bearing on left ankle to return to job and performing self care and household tasks.   OBJECTIVE IMPAIRMENTS: Abnormal gait, difficulty walking, decreased ROM, decreased strength, hypomobility, increased edema, impaired sensation, and pain.   ACTIVITY LIMITATIONS: carrying, lifting, bending, standing, squatting, stairs, bathing, toileting, dressing, hygiene/grooming, locomotion level, and caring for others  PARTICIPATION LIMITATIONS: cleaning, laundry, driving, shopping, community activity, and occupation  PERSONAL FACTORS: Age, Education, Fitness, Profession, and Time since onset of injury/illness/exacerbation are also affecting patient's functional outcome.   REHAB POTENTIAL: Good  CLINICAL DECISION MAKING: Stable/uncomplicated  EVALUATION COMPLEXITY: Low   GOALS: Goals reviewed with patient? No  SHORT TERM GOALS: Target date: 03/08/2024  Patient will demonstrate undestanding of home exercise plan by performing exercises correctly with evidence of good carry over with min to no verbal or tactile cues when demonstrating exercises. Baseline: NT 03/07/24: Performing exercises independently  Goal status: ACHIEVED    2.  Patient will be able to perform partial weight bearing exercises on LLE as evidence of ongoing healing and improved function in left ankle.  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 Lujean Rave et al [id number]) Baseline: 11/80 03/16/24: 16.3 %             Goal status: ONGOING    2.  Patient will demonstrate improved left ankle AROM with range of motion within 20% of right ankle to improve ankle mechanics to ambulate without limitations.  Baseline: Ankle DF R/L 10/5, Ankle PF 50/8, Ankle Inv R/L 35/10, Ankle Ev R/L 15/5  03/15/24: DF R 10,PF R 40, Inv R 20, Ev R 20  Goal status: ONGOING   3.  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  Patient will improve left ankle strength to be near symmetrical to right ankle for improved left ankle function to be able to normalize gait  and return to weight bearing activities for her job.  Baseline: Ankle DF R/L 4+/3+, Ankle PF R/L 4+/4-, Ankle Ever R/L 4+/4- , Ankle Inv R/L 4+/4- 03/15/24: DF R 4, PF R 4, Inv R 4-, Ev R 3+ Goal status: ONGOING      PLAN:  PT FREQUENCY: 1-2x/week  PT DURATION: 10 weeks  PLANNED INTERVENTIONS: 97164- PT Re-evaluation, 97110-Therapeutic exercises, 97530- Therapeutic activity, 97112- Neuromuscular re-education, 97535- Self Care, 51884- Manual therapy, [id number]- Gait training, [phone]- Orthotic Fit/training, [phone]- Aquatic Therapy, [phone]- Electrical stimulation (unattended), [phone]- Electrical stimulation (manual), 97597- Wound care (first 20 sq cm), Patient/Family education, Balance training, Stair training, Dry Needling, Joint mobilization, Joint manipulation, Spinal manipulation, Spinal mobilization, Scar mobilization, DME instructions, Cryotherapy, and Moist heat  PLAN FOR NEXT SESSION:  Nu-step warm up. Weight shifts without use of boot on pad  and sit to stand without use of boot.    Ellin Goodie PT, DPT  [facility] [address] [place], [address] Phone: [phone]   Fax:  [phone]

## 2024-03-23 ENCOUNTER — Encounter: Payer: Self-pay | Admitting: Physical Therapy

## 2024-03-23 ENCOUNTER — Ambulatory Visit: Attending: Orthopaedic Surgery | Admitting: Physical Therapy

## 2024-03-23 DIAGNOSIS — M25572 Pain in left ankle and joints of left foot: Secondary | ICD-10-CM | POA: Insufficient documentation

## 2024-03-23 NOTE — Therapy (Signed)
 OUTPATIENT PHYSICAL THERAPY LOWER EXTREMITY TREATMENT    Patient Name: [name] MRN: [id number] DOB:1961/09/17, 63 y.o., female Today's Date: 03/23/2024  END OF SESSION:  PT End of Session - 03/23/24 0931     Visit Number 8    Number of Visits 20    Date for PT Re-Evaluation 05/03/24    Authorization Type [id number]    Authorization - Visit Number 8    Authorization - Number of Visits 20    Progress Note Due on Visit 10    PT Start Time 0905    PT Stop Time 0945    PT Time Calculation (min) 40 min    Activity Tolerance Patient tolerated treatment well    Behavior During Therapy WFL for tasks assessed/performed                Past Medical History:  Diagnosis Date   Complication of anesthesia    PONV (postoperative nausea and vomiting)    Past Surgical History:  Procedure Laterality Date   AUGMENTATION MAMMAPLASTY Bilateral 2010   BICEPT TENODESIS Right 09/24/2021   Procedure: BICEPS TENODESIS;  Surgeon: Cammy Copa, MD;  Location: [facility] OR;  Service: Orthopedics;  Laterality: Right;   CERVICAL SPINE SURGERY  2003   C5-C7- double dissectomy   PAROTID GLAND TUMOR EXCISION     SHOULDER ARTHROSCOPY Right 09/24/2021   Procedure: right shoulder arthroscopy, debridement,open rotator cuff tear repair;  Surgeon: Cammy Copa, MD;  Location: [facility] OR;  Service: Orthopedics;  Laterality: Right;   WISDOM TOOTH EXTRACTION Bilateral    Patient Active Problem List   Diagnosis Date Noted   Elevated low density lipoprotein (LDL) cholesterol level 08/01/2023   Complete tear of right rotator cuff    Degenerative superior labral anterior-to-posterior (SLAP) tear of right shoulder    Healthcare maintenance 07/31/2021   Congenital pes cavus 02/25/2021   History of uterine fibroid 01/05/2021   Herpes 07/12/2020   GERD (gastroesophageal reflux disease) 05/23/2020   Vaginal atrophy 05/23/2020   Cervical spine arthritis 05/23/2020   Osteopenia 05/23/2020   Insomnia  05/20/2020   Postmenopausal hormone therapy 05/20/2020    PCP: Dr. Aura Dials   REFERRING PROVIDER: Dr. Dub Mikes   REFERRING DIAG: Left Ankle Fracture   THERAPY DIAG:  No diagnosis found.  Rationale for Evaluation and Treatment: Rehabilitation  ONSET DATE: 01/05/24 (Surgery Date)   SUBJECTIVE:   SUBJECTIVE STATEMENT: Pt states that she was feeling lateral ankle pain after walking a lot yesterday.   PERTINENT HISTORY: Pt reports sustaining a left ankle injury on January 11th after sledding into a tree. She sustained a pylon fracture of left ankle and she has since been non-weight bearing in post operative boot. He wounds have healed since and she is experiencing swelling in left foot but this has also reduced since initial surgery. She is to remain non-weight bearing on left ankle until March 12th.  PAIN:  Are you having pain? Yes: NPRS scale: 3/10  Pain location: Medial and lateral left ankle  Pain description: Achy  Aggravating factors: When moving ankle  Relieving factors: Keeping ankle still   PRECAUTIONS: None  RED FLAGS: None   WEIGHT BEARING RESTRICTIONS: Yes Non-weight bearing and needs to use post surgical boot and scooter until March 12th   FALLS:  Has patient fallen in last 6 months? No  LIVING ENVIRONMENT: Lives with: lives with their spouse Lives in: House/apartment Stairs:  Did not ask  Has following equipment at home: shower chair and  Scooter   OCCUPATION: Engineer, civil (consulting); works at an outpatient clinic   PLOF: Independent  PATIENT GOALS: To return to walking like she did before the accident.   NEXT MD VISIT: Not sure, end of March   OBJECTIVE:  Note: Objective measures were completed at Evaluation unless otherwise noted.  VITALS:  BP 108/52  HR 93 SpO2 99   DIAGNOSTIC FINDINGS:   CLINICAL DATA:  Ankle trauma, fracture, xray done (Age >= 5y)   EXAM: CT OF THE LEFT ANKLE WITHOUT CONTRAST   TECHNIQUE: Multidetector CT imaging of the  left ankle was performed according to the standard protocol. Multiplanar CT image reconstructions were also generated.   RADIATION DOSE REDUCTION: This exam was performed according to the departmental dose-optimization program which includes automated exposure control, adjustment of the mA and/or kV according to patient size and/or use of iterative reconstruction technique.   COMPARISON:  None Available.   FINDINGS: Bones/Joint/Cartilage   Acute, complex trimalleolar fracture of the left ankle. Vertically oriented fracture through the base of the medial malleolus with mild medial displacement resulting in approximately 4 mm of articular-surface diastasis. Comminuted intra-articular fracture of the tibial plafond and with vertical extension into the distal metaphysis. Approximately 5 mm of articular-surface depression and diastasis. Transversely oriented fracture through the distal fibular metaphysis without significant displacement. Medial subluxation of the talar dome relative to the distal tibia without dislocation.   Bones of the hindfoot and midfoot are intact without fracture or malalignment. Joint spaces are preserved.   Ligaments   Suboptimally assessed by CT.   Muscles and Tendons   No acute musculotendinous abnormality by CT although position of the tibialis posterior tendon and flexor digitorum longus tendons could be at risk for entrapment.   Soft tissues   Soft tissue swelling with ill-defined hematoma about the ankle, more pronounced medially. No soft tissue air to suggest open fracture.   IMPRESSION: 1. Acute, complex trimalleolar fracture of the left ankle, as described above. 2. Medial subluxation of the talar dome relative to the distal tibia without dislocation. 3. Soft tissue swelling with ill-defined hematoma about the ankle, more pronounced medially.     Electronically Signed   By: Duanne Guess D.O.   On: 01/02/2024 14:54    PATIENT  SURVEYS:  LEFS NT   COGNITION: Overall cognitive status: Within functional limits for tasks assessed     SENSATION: Light touch: Impaired   EDEMA:  Figure 8: NT    POSTURE: No Significant postural limitations  PALPATION: Dorsal side of right DIPS TTP   LOWER EXTREMITY ROM:  A/PROM Right eval Left eval Left  03/15/24  Hip flexion     Hip extension     Hip abduction     Hip adduction     Hip internal rotation     Hip external rotation     Knee flexion     Knee extension     Ankle dorsiflexion 10/12 5/7 10  Ankle plantarflexion 50 8/9 40  Ankle inversion 35 10/12 20  Ankle eversion 15 5/5 20   (Blank rows = not tested)    LOWER EXTREMITY MMT:  MMT Right eval Left eval Left  03/15/24  Hip flexion     Hip extension     Hip abduction     Hip adduction     Hip internal rotation     Hip external rotation     Knee flexion     Knee extension     Ankle dorsiflexion (Long Sitting) 4+  3+ 4  Ankle plantarflexion (Long Sitting) 4+ 4- 4  Ankle inversion 4+ 3+ 4-  Ankle eversion 4+ 3+ 3+   (Blank rows = not tested)   FUNCTIONAL TESTS:  None performed   GAIT:  N/a; non-weight bearing on left foot                                                                                                                                 TREATMENT DATE:   03/23/24:  THERAPEUTIC ACTIVITY   Nu-Step with seat and arms at 8 for 5 min  -not donning boot   Lateral weight shift with LLE on foam and use of BUE support  x 30  Forward and Back weight shift with LLE on foam and use of BUE support x 30  -not donning boot  Sit to Stand with BUE support and LLE on foam without use of boot  1 x 10  -Pt reports increased lateral ankle pain over lateral malleolus  Sit to Stand with BUE support and LLE on foam without use of boot  1 x 10  -Medial wedge to offload lateral ankle joint  -Pt reports no increase in lateral ankle pain   03/21/24:  THEREX   BAPS level 3   -PF/DF x 30   -Ev/Inv x 30  -Counter Clockwise x 10  -Clockwise x 10    LLE Standing Dorsiflexion Movement with Self Mobilization with posterior glide using theraband 2 x 10   THERAPEUTIC ACTIVITY  10 meter x 2 ambulation with BUE support and step gait pattern with progression to modified step through.  Modified Single Leg Stance on LLE with BUE support and use of 4 inch block 3 x 30 sec    03/15/24:  THEREX  BAPS level 3   -PF/DF x 30  -Ev/Inv x 30  -Counter Clockwise x 10  -Clockwise x 10   PHYSICAL PERFORMANCE    Ankle AROM  DF R 10  PF R 40  Inv R 20  Ev R 20   Ankle MMT  DF R 4 PF R 4   Inv R 4-  Ev R 3+  LEFS 16.3%   GAIT TRAINING  Partial weight bearing with 2WW 20 ft  -min VC to sequence steps and to utilize UE support     PATIENT EDUCATION:  Education details: Form and technique for correct performance of exercise. Person educated: Patient Education method: Explanation, Demonstration, Verbal cues, and Handouts Education comprehension: verbalized understanding and returned demonstration  HOME EXERCISE PROGRAM: Access Code: [email or web address] URL: [email or web address] Date: 03/23/2024 Prepared by: Ellin Goodie  Exercises - Supine Ankle Circles  - 1 x daily - 7 x weekly - 3 sets - 10 reps - Seated Soleus Stretch with Strap  - 1 x daily - 7 x weekly - 3 reps - 30-60 sec  hold - Long Sitting Calf Stretch with Strap  - 1 x daily - 3 reps -  30-60 sec sec  hold - Long Sitting Ankle Plantar Flexion with Resistance  - 3-4 x weekly - 3 sets - 10 reps - Seated Figure 4 Ankle Inversion with Resistance  - 3-4 x weekly - 3 sets - 10 reps - Sit to Stand  - 3-4 x weekly - 3 sets - 10 reps - Kneeling Ankle Dorsiflexion Self-Mobilization with Towel  - 1 x daily - 7 x weekly - 2 sets - 10 reps - Staggered Sit-to-Stand  - 3-4 x weekly - 3 sets - 10 reps - Lateral Weight Shift with Arm Raise and Walker  - 3-4 x weekly - 3 sets - 10 reps - Staggered Stance Forward  Backward Weight Shift with Counter Support  - 3-4 x weekly - 3 sets - 10 reps  Patient Education - Scar Massage  ASSESSMENT:  CLINICAL IMPRESSION: Pt presents s/p 12 weeks ORIF of left ankle. She continues to show improvement in left ankle function with ability to weight bear without boat on complaint surface with BUE support. Medial ankle wedge helped with relieving lateral ankle pain that pt was experiencing with weight bearing. She will continue to benefit from skilled PT to address these aforementioned deficits to return to walking and weight bearing on left ankle to return to job and performing self care and household tasks.  OBJECTIVE IMPAIRMENTS: Abnormal gait, difficulty walking, decreased ROM, decreased strength, hypomobility, increased edema, impaired sensation, and pain.   ACTIVITY LIMITATIONS: carrying, lifting, bending, standing, squatting, stairs, bathing, toileting, dressing, hygiene/grooming, locomotion level, and caring for others  PARTICIPATION LIMITATIONS: cleaning, laundry, driving, shopping, community activity, and occupation  PERSONAL FACTORS: Age, Education, Fitness, Profession, and Time since onset of injury/illness/exacerbation are also affecting patient's functional outcome.   REHAB POTENTIAL: Good  CLINICAL DECISION MAKING: Stable/uncomplicated  EVALUATION COMPLEXITY: Low   GOALS: Goals reviewed with patient? No  SHORT TERM GOALS: Target date: 03/08/2024  Patient will demonstrate undestanding of home exercise plan by performing exercises correctly with evidence of good carry over with min to no verbal or tactile cues when demonstrating exercises. Baseline: NT 03/07/24: Performing exercises independently  Goal status: ACHIEVED    2.  Patient will be able to perform partial weight bearing exercises on LLE as evidence of ongoing healing and improved function in left ankle.  Baseline: Unable to weight bear on left ankle until March 12th  Goal status: ONGOING     LONG TERM GOALS: Target date: 05/03/2024  Patient will improve her LEFS score by >=9 pt to demonstrate a statistically significant improvement in her right ankle function to be able to walk longer distances to collect items for her job. Lujean Rave et al [id number]) Baseline: 11/80 03/16/24: 16.3 %             Goal status: ONGOING    2.  Patient will demonstrate improved left ankle AROM with range of motion within 20% of right ankle to improve ankle mechanics to ambulate without limitations.  Baseline: Ankle DF R/L 10/5, Ankle PF 50/8, Ankle Inv R/L 35/10, Ankle Ev R/L 15/5  03/15/24: DF R 10,PF R 40, Inv R 20, Ev R 20  Goal status: ONGOING   3.  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  Patient will improve left ankle strength to be near symmetrical to right ankle for improved left ankle function to be able to normalize gait and return to weight bearing activities for her job.  Baseline: Ankle DF R/L 4+/3+, Ankle PF R/L 4+/4-, Ankle Ever R/L 4+/4- , Ankle Inv R/L 4+/4- 03/15/24: DF R 4, PF R 4, Inv R 4-, Ev R 3+ Goal status: ONGOING      PLAN:  PT FREQUENCY: 1-2x/week  PT DURATION: 10 weeks  PLANNED INTERVENTIONS: 97164- PT Re-evaluation, 97110-Therapeutic exercises, 97530- Therapeutic activity, 97112- Neuromuscular re-education, 97535- Self Care, 16109- Manual therapy, [id number]- Gait training, [phone]- Orthotic Fit/training, [phone]- Aquatic Therapy, [phone]- Electrical stimulation (unattended), [phone]- Electrical stimulation (manual), 97597- Wound care (first 20 sq cm), Patient/Family education, Balance training, Stair training, Dry Needling, Joint mobilization, Joint manipulation, Spinal  manipulation, Spinal mobilization, Scar mobilization, DME instructions, Cryotherapy, and Moist heat  PLAN FOR NEXT SESSION:  Recumbent Bicycle for warm up. Gait training with boot and increased weight bearing through LLE    Ellin Goodie PT, DPT  [facility] [address] [place], [address] Phone: [phone]   Fax:  [phone]

## 2024-03-28 ENCOUNTER — Telehealth: Payer: Self-pay | Admitting: Physical Therapy

## 2024-03-28 ENCOUNTER — Ambulatory Visit: Admitting: Physical Therapy

## 2024-03-28 NOTE — Telephone Encounter (Signed)
 Called pt to inquire about rescheduling for tomorrow. Pt reports that she needs to check on her schedule and see if she is able to get transportation.

## 2024-03-31 ENCOUNTER — Ambulatory Visit: Admitting: Physical Therapy

## 2024-03-31 DIAGNOSIS — M25572 Pain in left ankle and joints of left foot: Secondary | ICD-10-CM

## 2024-03-31 NOTE — Therapy (Signed)
 OUTPATIENT PHYSICAL THERAPY LOWER EXTREMITY TREATMENT    Patient Name: [name] MRN: [id number] DOB:02-Mar-1961, 63 y.o., female Today's Date: 03/31/2024  END OF SESSION:  PT End of Session - 03/31/24 1123     Visit Number 9    Number of Visits 20    Date for PT Re-Evaluation 05/03/24    Authorization Type [id number]    Authorization - Visit Number 9    Authorization - Number of Visits 20    Progress Note Due on Visit 10    PT Start Time 1120    PT Stop Time 1200    PT Time Calculation (min) 40 min    Activity Tolerance Patient tolerated treatment well    Behavior During Therapy WFL for tasks assessed/performed                Past Medical History:  Diagnosis Date   Complication of anesthesia    PONV (postoperative nausea and vomiting)    Past Surgical History:  Procedure Laterality Date   AUGMENTATION MAMMAPLASTY Bilateral 2010   BICEPT TENODESIS Right 09/24/2021   Procedure: BICEPS TENODESIS;  Surgeon: Cammy Copa, MD;  Location: [facility] OR;  Service: Orthopedics;  Laterality: Right;   CERVICAL SPINE SURGERY  2003   C5-C7- double dissectomy   PAROTID GLAND TUMOR EXCISION     SHOULDER ARTHROSCOPY Right 09/24/2021   Procedure: right shoulder arthroscopy, debridement,open rotator cuff tear repair;  Surgeon: Cammy Copa, MD;  Location: [facility] OR;  Service: Orthopedics;  Laterality: Right;   WISDOM TOOTH EXTRACTION Bilateral    Patient Active Problem List   Diagnosis Date Noted   Elevated low density lipoprotein (LDL) cholesterol level 08/01/2023   Complete tear of right rotator cuff    Degenerative superior labral anterior-to-posterior (SLAP) tear of right shoulder    Healthcare maintenance 07/31/2021   Congenital pes cavus 02/25/2021   History of uterine fibroid 01/05/2021   Herpes 07/12/2020   GERD (gastroesophageal reflux disease) 05/23/2020   Vaginal atrophy 05/23/2020   Cervical spine arthritis 05/23/2020   Osteopenia 05/23/2020   Insomnia  05/20/2020   Postmenopausal hormone therapy 05/20/2020    PCP: Dr. Aura Dials   REFERRING PROVIDER: Dr. Dub Mikes   REFERRING DIAG: Left Ankle Fracture   THERAPY DIAG:  Pain in left ankle and joints of left foot  Rationale for Evaluation and Treatment: Rehabilitation  ONSET DATE: 01/05/24 (Surgery Date)   SUBJECTIVE:   SUBJECTIVE STATEMENT: Pt states that she feels steady progress but continues to be limited from full weight bearing. She is now feeling increased dorsal foot pain when dorsiflexion and plantarflexion.   PERTINENT HISTORY: Pt reports sustaining a left ankle injury on January 11th after sledding into a tree. She sustained a pylon fracture of left ankle and she has since been non-weight bearing in post operative boot. He wounds have healed since and she is experiencing swelling in left foot but this has also reduced since initial surgery. She is to remain non-weight bearing on left ankle until March 12th.  PAIN:  Are you having pain? Yes: NPRS scale: 5/10  Pain location: Dorsal side of right ankle   Pain description: Achy  Aggravating factors: When moving ankle  Relieving factors: Keeping ankle still   PRECAUTIONS: None  RED FLAGS: None   WEIGHT BEARING RESTRICTIONS: Yes Non-weight bearing and needs to use post surgical boot and scooter until March 12th   FALLS:  Has patient fallen in last 6 months? No  LIVING ENVIRONMENT: Lives with:  lives with their spouse Lives in: House/apartment Stairs:  Did not ask  Has following equipment at home: shower chair and Scooter   OCCUPATION: Nurse; works at an outpatient clinic   PLOF: Independent  PATIENT GOALS: To return to walking like she did before the accident.   NEXT MD VISIT: Not sure, end of March   OBJECTIVE:  Note: Objective measures were completed at Evaluation unless otherwise noted.  VITALS:  BP 108/52  HR 93 SpO2 99   DIAGNOSTIC FINDINGS:   CLINICAL DATA:  Ankle trauma, fracture,  xray done (Age >= 5y)   EXAM: CT OF THE LEFT ANKLE WITHOUT CONTRAST   TECHNIQUE: Multidetector CT imaging of the left ankle was performed according to the standard protocol. Multiplanar CT image reconstructions were also generated.   RADIATION DOSE REDUCTION: This exam was performed according to the departmental dose-optimization program which includes automated exposure control, adjustment of the mA and/or kV according to patient size and/or use of iterative reconstruction technique.   COMPARISON:  None Available.   FINDINGS: Bones/Joint/Cartilage   Acute, complex trimalleolar fracture of the left ankle. Vertically oriented fracture through the base of the medial malleolus with mild medial displacement resulting in approximately 4 mm of articular-surface diastasis. Comminuted intra-articular fracture of the tibial plafond and with vertical extension into the distal metaphysis. Approximately 5 mm of articular-surface depression and diastasis. Transversely oriented fracture through the distal fibular metaphysis without significant displacement. Medial subluxation of the talar dome relative to the distal tibia without dislocation.   Bones of the hindfoot and midfoot are intact without fracture or malalignment. Joint spaces are preserved.   Ligaments   Suboptimally assessed by CT.   Muscles and Tendons   No acute musculotendinous abnormality by CT although position of the tibialis posterior tendon and flexor digitorum longus tendons could be at risk for entrapment.   Soft tissues   Soft tissue swelling with ill-defined hematoma about the ankle, more pronounced medially. No soft tissue air to suggest open fracture.   IMPRESSION: 1. Acute, complex trimalleolar fracture of the left ankle, as described above. 2. Medial subluxation of the talar dome relative to the distal tibia without dislocation. 3. Soft tissue swelling with ill-defined hematoma about the ankle, more  pronounced medially.     Electronically Signed   By: Duanne Guess D.O.   On: 01/02/2024 14:54    PATIENT SURVEYS:  LEFS NT   COGNITION: Overall cognitive status: Within functional limits for tasks assessed     SENSATION: Light touch: Impaired   EDEMA:  Figure 8: NT    POSTURE: No Significant postural limitations  PALPATION: Dorsal side of right DIPS TTP   LOWER EXTREMITY ROM:  A/PROM Right eval Left eval Left  03/15/24  Hip flexion     Hip extension     Hip abduction     Hip adduction     Hip internal rotation     Hip external rotation     Knee flexion     Knee extension     Ankle dorsiflexion 10/12 5/7 10  Ankle plantarflexion 50 8/9 40  Ankle inversion 35 10/12 20  Ankle eversion 15 5/5 20   (Blank rows = not tested)    LOWER EXTREMITY MMT:  MMT Right eval Left eval Left  03/15/24  Hip flexion     Hip extension     Hip abduction     Hip adduction     Hip internal rotation     Hip external rotation  Knee flexion     Knee extension     Ankle dorsiflexion (Long Sitting) 4+ 3+ 4  Ankle plantarflexion (Long Sitting) 4+ 4- 4  Ankle inversion 4+ 3+ 4-  Ankle eversion 4+ 3+ 3+   (Blank rows = not tested)   FUNCTIONAL TESTS:  None performed   GAIT:  N/a; non-weight bearing on left foot                                                                                                                                 TREATMENT DATE:   03/31/24:  THEREX   Nu-Step with seat and arms at 7 for 5 min   THERAPEUTIC ACTIVITY: Weight shifts with left foot pad   Lateral Weight Shift with BUE support  1 x 10  Forward Weight Shift onto LLE with intermittent UE support 1 x 10  Forward Weight Shift onto LLE with increased space between feet with intermittent UE support   1 x 10  Forward Weight Shift on  LLE with increased space between feet with 1 UE support 2 x 10  Backward Weight Shift on LLE with narrowed base of support with BUE support 2 x  10   MANUAL THERAPY  Left Ankle- Anterior and posterior glides of talocrural joint grade III-IV 3 x 30  -with ice packed placed over joint to soften pressure  Left Ankle Distraction    03/23/24:  THERAPEUTIC ACTIVITY   Nu-Step with seat and arms at 8 for 5 min  -not donning boot   Lateral weight shift with LLE on foam and use of BUE support  x 30  Forward and Back weight shift with LLE on foam and use of BUE support x 30  -not donning boot  Sit to Stand with BUE support and LLE on foam without use of boot  1 x 10  -Pt reports increased lateral ankle pain over lateral malleolus  Sit to Stand with BUE support and LLE on foam without use of boot  1 x 10  -Medial wedge to offload lateral ankle joint  -Pt reports no increase in lateral ankle pain   03/21/24:  THEREX   BAPS level 3   -PF/DF x 30  -Ev/Inv x 30  -Counter Clockwise x 10  -Clockwise x 10    LLE Standing Dorsiflexion Movement with Self Mobilization with posterior glide using theraband 2 x 10   THERAPEUTIC ACTIVITY  10 meter x 2 ambulation with BUE support and step gait pattern with progression to modified step through.  Modified Single Leg Stance on LLE with BUE support and use of 4 inch block 3 x 30 sec    03/15/24:  THEREX  BAPS level 3   -PF/DF x 30  -Ev/Inv x 30  -Counter Clockwise x 10  -Clockwise x 10   PHYSICAL PERFORMANCE    Ankle AROM  DF R 10  PF R 40  Inv R 20  Ev R 20   Ankle MMT  DF R 4 PF R 4   Inv R 4-  Ev R 3+  LEFS 16.3%   GAIT TRAINING  Partial weight bearing with 2WW 20 ft  -min VC to sequence steps and to utilize UE support     PATIENT EDUCATION:  Education details: Form and technique for correct performance of exercise. Person educated: Patient Education method: Explanation, Demonstration, Verbal cues, and Handouts Education comprehension: verbalized understanding and returned demonstration  HOME EXERCISE PROGRAM: Access Code: [email or web address] URL:  [email or web address] Date: 03/31/2024 Prepared by: Ellin Goodie  Exercises - Supine Ankle Circles  - 1 x daily - 7 x weekly - 3 sets - 10 reps - Seated Soleus Stretch with Strap  - 1 x daily - 7 x weekly - 3 reps - 30-60 sec  hold - Long Sitting Calf Stretch with Strap  - 1 x daily - 3 reps - 30-60 sec sec  hold - Long Sitting Ankle Plantar Flexion with Resistance  - 3-4 x weekly - 3 sets - 10 reps - Seated Figure 4 Ankle Inversion with Resistance  - 3-4 x weekly - 3 sets - 10 reps - Sit to Stand  - 3-4 x weekly - 3 sets - 10 reps - Kneeling Ankle Dorsiflexion Self-Mobilization with Towel  - 1 x daily - 7 x weekly - 2 sets - 10 reps - Staggered Sit-to-Stand  - 3-4 x weekly - 3 sets - 10 reps - Lateral Weight Shift with Arm Raise and Walker  - 3-4 x weekly - 3 sets - 10 reps - Staggered Stance Forward Backward Weight Shift with Counter Support  - 3-4 x weekly - 3 sets - 10 reps  Patient Education - Scar Massage  ASSESSMENT:  CLINICAL IMPRESSION: Pt presents s/p 12 weeks ORIF of left ankle. Pt demonstrates ongoing improvement with weight bearing and decreased pain with weight bearing on left ankle. She continues to be limited by anterior ankle pain located on talus and it worsens with increased dorsiflexion. She continues to lack dorsiflexion ROM on left ankle. She also experienced pain on the lateral side of her ankle and it was relieved with a pillow cover placed under the medial longitudinal arch. She will continue to benefit from skilled PT to address these aforementioned deficits to return to walking and weight bearing on left ankle to return to job and performing self care and household tasks.   OBJECTIVE IMPAIRMENTS: Abnormal gait, difficulty walking, decreased ROM, decreased strength, hypomobility, increased edema, impaired sensation, and pain.   ACTIVITY LIMITATIONS: carrying, lifting, bending, standing, squatting, stairs, bathing, toileting, dressing,  hygiene/grooming, locomotion level, and caring for others  PARTICIPATION LIMITATIONS: cleaning, laundry, driving, shopping, community activity, and occupation  PERSONAL FACTORS: Age, Education, Fitness, Profession, and Time since onset of injury/illness/exacerbation are also affecting patient's functional outcome.   REHAB POTENTIAL: Good  CLINICAL DECISION MAKING: Stable/uncomplicated  EVALUATION COMPLEXITY: Low   GOALS: Goals reviewed with patient? No  SHORT TERM GOALS: Target date: 03/08/2024  Patient will demonstrate undestanding of home exercise plan by performing exercises correctly with evidence of good carry over with min to no verbal or tactile cues when demonstrating exercises. Baseline: NT 03/07/24: Performing exercises independently  Goal status: ACHIEVED    2.  Patient will be able to perform partial weight bearing exercises on LLE as evidence of ongoing healing and improved function in left ankle.  Baseline: Unable to weight bear on left ankle until March 12th  Goal status: ONGOING  LONG TERM GOALS: Target date: 05/03/2024  Patient will improve her LEFS score by >=9 pt to demonstrate a statistically significant improvement in her right ankle function to be able to walk longer distances to collect items for her job. Lujean Rave et al 1610) Baseline: 11/80 03/16/24: 16.3 %             Goal status: ONGOING    2.  Patient will demonstrate improved left ankle AROM with range of motion within 20% of right ankle to improve ankle mechanics to ambulate without limitations.  Baseline: Ankle DF R/L 10/5, Ankle PF 50/8, Ankle Inv R/L 35/10, Ankle Ev R/L 15/5  03/15/24: DF R 10,PF R 40, Inv R 20, Ev R 20  Goal status: ONGOING   3.  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  Patient will improve left ankle strength to be near symmetrical to right ankle for improved left ankle function to be able to normalize gait and return to weight bearing activities for her job.  Baseline: Ankle DF R/L 4+/3+, Ankle PF R/L 4+/4-, Ankle Ever R/L 4+/4- , Ankle Inv R/L 4+/4- 03/15/24: DF R 4, PF R 4, Inv R 4-, Ev R 3+ Goal status: ONGOING      PLAN:  PT FREQUENCY: 1-2x/week  PT DURATION: 10 weeks  PLANNED INTERVENTIONS: 97164- PT Re-evaluation, 97110-Therapeutic exercises, 97530- Therapeutic activity, 97112- Neuromuscular re-education, 97535- Self Care, 96045- Manual therapy, [id number]- Gait training, [phone]- Orthotic Fit/training, [phone]- Aquatic Therapy, [phone]- Electrical stimulation (unattended), [phone]- Electrical stimulation (manual), 97597- Wound care (first 20 sq cm), Patient/Family education, Balance training, Stair training, Dry Needling, Joint mobilization, Joint manipulation, Spinal manipulation, Spinal mobilization, Scar mobilization, DME instructions, Cryotherapy, and Moist heat  PLAN FOR NEXT SESSION:  Recumbent Bicycle for warm up. Reassess goals. Walking with decreased UE support, SPC with boot or two single point canes. And attempt weight bearing in shoe.    Ellin Goodie PT, DPT  [facility] [address] [place], [address] Phone: [phone]   Fax:  [phone]

## 2024-04-04 ENCOUNTER — Ambulatory Visit: Admitting: Physical Therapy

## 2024-04-05 ENCOUNTER — Ambulatory Visit: Admitting: Physical Therapy

## 2024-04-05 DIAGNOSIS — M25572 Pain in left ankle and joints of left foot: Secondary | ICD-10-CM | POA: Diagnosis not present

## 2024-04-05 NOTE — Therapy (Signed)
 OUTPATIENT PHYSICAL THERAPY LOWER EXTREMITY PROGRESS NOTE   Dates of Reporting: 02/23/24-03/31/24   Patient Name: [name] MRN: [id number] DOB:[birth year], 63 y.o., female Today's Date: 04/05/2024  END OF SESSION:  PT End of Session - 04/05/24 0958     Visit Number 10    Number of Visits 20    Date for PT Re-Evaluation 05/03/24    Authorization Type [id number]    Authorization - Visit Number 10    Authorization - Number of Visits 20    Progress Note Due on Visit 10    PT Start Time 0950    PT Stop Time 1030    PT Time Calculation (min) 40 min    Activity Tolerance Patient tolerated treatment well    Behavior During Therapy WFL for tasks assessed/performed                 Past Medical History:  Diagnosis Date   Complication of anesthesia    PONV (postoperative nausea and vomiting)    Past Surgical History:  Procedure Laterality Date   AUGMENTATION MAMMAPLASTY Bilateral 2010   BICEPT TENODESIS Right 09/24/2021   Procedure: BICEPS TENODESIS;  Surgeon: Jasmine Mesi, MD;  Location: [facility] OR;  Service: Orthopedics;  Laterality: Right;   CERVICAL SPINE SURGERY  2003   C5-C7- double dissectomy   PAROTID GLAND TUMOR EXCISION     SHOULDER ARTHROSCOPY Right 09/24/2021   Procedure: right shoulder arthroscopy, debridement,open rotator cuff tear repair;  Surgeon: Jasmine Mesi, MD;  Location: [facility] OR;  Service: Orthopedics;  Laterality: Right;   WISDOM TOOTH EXTRACTION Bilateral    Patient Active Problem List   Diagnosis Date Noted   Elevated low density lipoprotein (LDL) cholesterol level 08/01/2023   Complete tear of right rotator cuff    Degenerative superior labral anterior-to-posterior (SLAP) tear of right shoulder    Healthcare maintenance 07/31/2021   Congenital pes cavus 02/25/2021   History of uterine fibroid 01/05/2021   Herpes 07/12/2020   GERD (gastroesophageal reflux disease) 05/23/2020   Vaginal atrophy 05/23/2020   Cervical spine arthritis  05/23/2020   Osteopenia 05/23/2020   Insomnia 05/20/2020   Postmenopausal hormone therapy 05/20/2020    PCP: Dr. Doran Galloway   REFERRING PROVIDER: Dr. Lasandra Points   REFERRING DIAG: Left Ankle Fracture   THERAPY DIAG:  No diagnosis found.  Rationale for Evaluation and Treatment: Rehabilitation  ONSET DATE: 01/05/24 (Surgery Date)   SUBJECTIVE:   SUBJECTIVE STATEMENT: Pt states that she feels steady progress but continues to be limited from full weight bearing. She is now feeling increased dorsal foot pain when dorsiflexion and plantarflexion.   PERTINENT HISTORY: Pt reports sustaining a left ankle injury on January 11th after sledding into a tree. She sustained a pylon fracture of left ankle and she has since been non-weight bearing in post operative boot. He wounds have healed since and she is experiencing swelling in left foot but this has also reduced since initial surgery. She is to remain non-weight bearing on left ankle until March 12th.  PAIN:  Are you having pain? Yes: NPRS scale: 5/10  Pain location: Dorsal side of right ankle   Pain description: Achy  Aggravating factors: When moving ankle  Relieving factors: Keeping ankle still   PRECAUTIONS: None  RED FLAGS: None   WEIGHT BEARING RESTRICTIONS: Yes Non-weight bearing and needs to use post surgical boot and scooter until March 12th   FALLS:  Has patient fallen in last 6 months? No  LIVING ENVIRONMENT: Lives  with: lives with their spouse Lives in: House/apartment Stairs:  Did not ask  Has following equipment at home: shower chair and Scooter   OCCUPATION: Nurse; works at an outpatient clinic   PLOF: Independent  PATIENT GOALS: To return to walking like she did before the accident.   NEXT MD VISIT: Not sure, end of March   OBJECTIVE:  Note: Objective measures were completed at Evaluation unless otherwise noted.  VITALS:  BP 108/52  HR 93 SpO2 99   DIAGNOSTIC FINDINGS:   CLINICAL DATA:   Ankle trauma, fracture, xray done (Age >= 5y)   EXAM: CT OF THE LEFT ANKLE WITHOUT CONTRAST   TECHNIQUE: Multidetector CT imaging of the left ankle was performed according to the standard protocol. Multiplanar CT image reconstructions were also generated.   RADIATION DOSE REDUCTION: This exam was performed according to the departmental dose-optimization program which includes automated exposure control, adjustment of the mA and/or kV according to patient size and/or use of iterative reconstruction technique.   COMPARISON:  None Available.   FINDINGS: Bones/Joint/Cartilage   Acute, complex trimalleolar fracture of the left ankle. Vertically oriented fracture through the base of the medial malleolus with mild medial displacement resulting in approximately 4 mm of articular-surface diastasis. Comminuted intra-articular fracture of the tibial plafond and with vertical extension into the distal metaphysis. Approximately 5 mm of articular-surface depression and diastasis. Transversely oriented fracture through the distal fibular metaphysis without significant displacement. Medial subluxation of the talar dome relative to the distal tibia without dislocation.   Bones of the hindfoot and midfoot are intact without fracture or malalignment. Joint spaces are preserved.   Ligaments   Suboptimally assessed by CT.   Muscles and Tendons   No acute musculotendinous abnormality by CT although position of the tibialis posterior tendon and flexor digitorum longus tendons could be at risk for entrapment.   Soft tissues   Soft tissue swelling with ill-defined hematoma about the ankle, more pronounced medially. No soft tissue air to suggest open fracture.   IMPRESSION: 1. Acute, complex trimalleolar fracture of the left ankle, as described above. 2. Medial subluxation of the talar dome relative to the distal tibia without dislocation. 3. Soft tissue swelling with ill-defined hematoma  about the ankle, more pronounced medially.     Electronically Signed   By: Leverne Reading D.O.   On: 01/02/2024 14:54    PATIENT SURVEYS:  LEFS NT   COGNITION: Overall cognitive status: Within functional limits for tasks assessed     SENSATION: Light touch: Impaired   EDEMA:  Figure 8: NT    POSTURE: No Significant postural limitations  PALPATION: Dorsal side of right DIPS TTP   LOWER EXTREMITY ROM:  A/PROM Right eval Left eval Left  03/15/24  Hip flexion     Hip extension     Hip abduction     Hip adduction     Hip internal rotation     Hip external rotation     Knee flexion     Knee extension     Ankle dorsiflexion 10/12 5/7 10  Ankle plantarflexion 50 8/9 40  Ankle inversion 35 10/12 20  Ankle eversion 15 5/5 20   (Blank rows = not tested)    LOWER EXTREMITY MMT:  MMT Right eval Left eval Left  03/15/24  Hip flexion     Hip extension     Hip abduction     Hip adduction     Hip internal rotation     Hip external  rotation     Knee flexion     Knee extension     Ankle dorsiflexion (Long Sitting) 4+ 3+ 4  Ankle plantarflexion (Long Sitting) 4+ 4- 4  Ankle inversion 4+ 3+ 4-  Ankle eversion 4+ 3+ 3+   (Blank rows = not tested)   FUNCTIONAL TESTS:  None performed   GAIT:  N/a; non-weight bearing on left foot                                                                                                                                 TREATMENT DATE:   04/05/24: THEREX  Nu-Step seat and arms at 9 for 5 min   GAIT TRAINING: All exercises performed with 2WW and while donning shoe instead of boot.  25 ft x 2 over ground ambulation with 2WW -Step too gait pattern  Static Stance Phase mid foot to toe off on LLE 2 x 10  25 ft x 6 over ground ambulation with 2WW Step through gait pattern  - min VC to increase base of support and to place most weight through BUE   PHY PERFORMANCE   DF R 4+, PF R 4+, Inv R 4, Ev R 4- DF R 15, PF R 40, Inv  R 30, Ev R 20  LEFS 26/80 or 30%   03/31/24:  THEREX   Nu-Step with seat and arms at 7 for 5 min   THERAPEUTIC ACTIVITY: Weight shifts with left foot pad   Lateral Weight Shift with BUE support  1 x 10  Forward Weight Shift onto LLE with intermittent UE support 1 x 10  Forward Weight Shift onto LLE with increased space between feet with intermittent UE support   1 x 10  Forward Weight Shift on  LLE with increased space between feet with 1 UE support 2 x 10  Backward Weight Shift on LLE with narrowed base of support with BUE support 2 x 10   MANUAL THERAPY  Left Ankle- Anterior and posterior glides of talocrural joint grade III-IV 3 x 30  -with ice packed placed over joint to soften pressure  Left Ankle Distraction    03/23/24:  THERAPEUTIC ACTIVITY   Nu-Step with seat and arms at 8 for 5 min  -not donning boot   Lateral weight shift with LLE on foam and use of BUE support  x 30  Forward and Back weight shift with LLE on foam and use of BUE support x 30  -not donning boot  Sit to Stand with BUE support and LLE on foam without use of boot  1 x 10  -Pt reports increased lateral ankle pain over lateral malleolus  Sit to Stand with BUE support and LLE on foam without use of boot  1 x 10  -Medial wedge to offload lateral ankle joint  -Pt reports no increase in lateral ankle pain   03/21/24:  THEREX   BAPS level 3   -  PF/DF x 30  -Ev/Inv x 30  -Counter Clockwise x 10  -Clockwise x 10    LLE Standing Dorsiflexion Movement with Self Mobilization with posterior glide using theraband 2 x 10   THERAPEUTIC ACTIVITY  10 meter x 2 ambulation with BUE support and step gait pattern with progression to modified step through.  Modified Single Leg Stance on LLE with BUE support and use of 4 inch block 3 x 30 sec    03/15/24:  THEREX  BAPS level 3   -PF/DF x 30  -Ev/Inv x 30  -Counter Clockwise x 10  -Clockwise x 10   PHYSICAL PERFORMANCE    Ankle AROM  DF R 10  PF R 40  Inv  R 20  Ev R 20   Ankle MMT  DF R 4 PF R 4   Inv R 4-  Ev R 3+  LEFS 16.3%   GAIT TRAINING  Partial weight bearing with 2WW 20 ft  -min VC to sequence steps and to utilize UE support     PATIENT EDUCATION:  Education details: Form and technique for correct performance of exercise. Person educated: Patient Education method: Explanation, Demonstration, Verbal cues, and Handouts Education comprehension: verbalized understanding and returned demonstration  HOME EXERCISE PROGRAM: Access Code: [email or web address] URL: [email or web address] Date: 03/31/2024 Prepared by: Marge Shed  Exercises - Supine Ankle Circles  - 1 x daily - 7 x weekly - 3 sets - 10 reps - Seated Soleus Stretch with Strap  - 1 x daily - 7 x weekly - 3 reps - 30-60 sec  hold - Long Sitting Calf Stretch with Strap  - 1 x daily - 3 reps - 30-60 sec sec  hold - Long Sitting Ankle Plantar Flexion with Resistance  - 3-4 x weekly - 3 sets - 10 reps - Seated Figure 4 Ankle Inversion with Resistance  - 3-4 x weekly - 3 sets - 10 reps - Sit to Stand  - 3-4 x weekly - 3 sets - 10 reps - Kneeling Ankle Dorsiflexion Self-Mobilization with Towel  - 1 x daily - 7 x weekly - 2 sets - 10 reps - Staggered Sit-to-Stand  - 3-4 x weekly - 3 sets - 10 reps - Lateral Weight Shift with Arm Raise and Walker  - 3-4 x weekly - 3 sets - 10 reps - Staggered Stance Forward Backward Weight Shift with Counter Support  - 3-4 x weekly - 3 sets - 10 reps  Patient Education - Scar Massage  ASSESSMENT:  CLINICAL IMPRESSION: Pt presents s/p 13 weeks ORIF of left ankle. Pt shows ongoing progress towards goals with increased weight bearing through LLE as evidenced by ability to ambulate without use of boot for the first time. She also demonstrates improved perception of left ankle function as well as improved left ankle mobility and strength. She does continue to show ongoing limitations with weight bearing on left ankle due to increased  pain and instability.  She will continue to benefit from skilled PT to address these aforementioned deficits to return to walking and weight bearing on left ankle to return to job and performing self care and household tasks.    OBJECTIVE IMPAIRMENTS: Abnormal gait, difficulty walking, decreased ROM, decreased strength, hypomobility, increased edema, impaired sensation, and pain.   ACTIVITY LIMITATIONS: carrying, lifting, bending, standing, squatting, stairs, bathing, toileting, dressing, hygiene/grooming, locomotion level, and caring for others  PARTICIPATION LIMITATIONS: cleaning, laundry, driving, shopping, community activity, and occupation  PERSONAL FACTORS: Age, Education, Fitness, Profession, and  Time since onset of injury/illness/exacerbation are also affecting patient's functional outcome.   REHAB POTENTIAL: Good  CLINICAL DECISION MAKING: Stable/uncomplicated  EVALUATION COMPLEXITY: Low   GOALS: Goals reviewed with patient? No  SHORT TERM GOALS: Target date: 03/08/2024  Patient will demonstrate undestanding of home exercise plan by performing exercises correctly with evidence of good carry over with min to no verbal or tactile cues when demonstrating exercises. Baseline: NT 03/07/24: Performing exercises independently  Goal status: ACHIEVED    2.  Patient will be able to perform partial weight bearing exercises on LLE as evidence of ongoing healing and improved function in left ankle.  Baseline: Unable to weight bear on left ankle until March 12th  04/05/24: Able to perform to use partial weight bearing.   Goal status: ACHIEVED    LONG TERM GOALS: Target date: 05/03/2024  Patient will improve her LEFS score by >=9 pt to demonstrate a statistically significant improvement in her right ankle function to be able to walk longer distances to collect items for her job. Lujean Rave et al 1610) Baseline: 11/80 03/16/24: 16.3 % 04/05/24: 26/80 (33%)            Goal status: ONGOING     2.  Patient will demonstrate improved left ankle AROM with range of motion within 20% of right ankle to improve ankle mechanics to ambulate without limitations.  Baseline: Ankle DF R/L 10/5, Ankle PF 50/8, Ankle Inv R/L 35/10, Ankle Ev R/L 15/5  03/15/24: DF R 10,PF R 40, Inv R 20, Ev R 20 04/05/24: DF R 15, PF R 40, Inv R 30, Ev R 20  Goal status: ONGOING   3.  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  Patient will improve left ankle strength to be near symmetrical to right ankle for improved left ankle function to be able to normalize gait and return to weight bearing activities for her job.  Baseline: Ankle DF R/L 4+/3+, Ankle PF R/L 4+/4-, Ankle Ever R/L 4+/4- , Ankle Inv R/L 4+/4- 03/15/24: DF R 4, PF R 4, Inv R 4-, Ev R 3+ 04/05/24: DF R 4+, PF R 4+, Inv R 4, Ev R 4- Goal status: ONGOING      PLAN:  PT FREQUENCY: 1-2x/week  PT DURATION: 10 weeks  PLANNED INTERVENTIONS: [id number]- PT Re-evaluation, 97110-Therapeutic exercises, 97530- Therapeutic activity, [id number]- Neuromuscular re-education, 97535- Self Care, 96045- Manual therapy, [id number]- Gait training, [phone]- Orthotic Fit/training, [phone]- Aquatic Therapy, [phone]- Electrical stimulation (unattended), [phone]- Electrical stimulation (manual), 97597- Wound care (first 20 sq cm), Patient/Family education, Balance training, Stair training, Dry Needling, Joint mobilization, Joint manipulation, Spinal manipulation, Spinal mobilization, Scar mobilization, DME instructions, Cryotherapy, and Moist heat  PLAN FOR NEXT SESSION:  Continue to progress ambulation with increase weight bearing on LLE and proper gait mechanics. Attempt to progress left ankle mobility  and strength exercises: step off calf stretch and heel raises using BUE support.   Ellin Goodie PT, DPT  [facility] [address] [place], [address] Phone: [phone]   Fax:  [phone]

## 2024-04-07 ENCOUNTER — Ambulatory Visit: Admitting: Physical Therapy

## 2024-04-07 DIAGNOSIS — M25572 Pain in left ankle and joints of left foot: Secondary | ICD-10-CM

## 2024-04-07 NOTE — Therapy (Signed)
 OUTPATIENT PHYSICAL THERAPY LOWER EXTREMITY TREATMENT   Patient Name: [name] MRN: [id number] DOB:[birth year], 63 y.o., female Today's Date: 04/07/2024  END OF SESSION:  PT End of Session - 04/07/24 1000     Visit Number 11    Number of Visits 20    Date for PT Re-Evaluation 05/03/24    Authorization Type [id number]    Authorization - Visit Number 11    Authorization - Number of Visits 20    Progress Note Due on Visit 20    PT Start Time 0950    PT Stop Time 1030    PT Time Calculation (min) 40 min    Activity Tolerance Patient tolerated treatment well    Behavior During Therapy WFL for tasks assessed/performed                  Past Medical History:  Diagnosis Date   Complication of anesthesia    PONV (postoperative nausea and vomiting)    Past Surgical History:  Procedure Laterality Date   AUGMENTATION MAMMAPLASTY Bilateral 2010   BICEPT TENODESIS Right 09/24/2021   Procedure: BICEPS TENODESIS;  Surgeon: Jasmine Mesi, MD;  Location: [facility] OR;  Service: Orthopedics;  Laterality: Right;   CERVICAL SPINE SURGERY  2003   C5-C7- double dissectomy   PAROTID GLAND TUMOR EXCISION     SHOULDER ARTHROSCOPY Right 09/24/2021   Procedure: right shoulder arthroscopy, debridement,open rotator cuff tear repair;  Surgeon: Jasmine Mesi, MD;  Location: [facility] OR;  Service: Orthopedics;  Laterality: Right;   WISDOM TOOTH EXTRACTION Bilateral    Patient Active Problem List   Diagnosis Date Noted   Elevated low density lipoprotein (LDL) cholesterol level 08/01/2023   Complete tear of right rotator cuff    Degenerative superior labral anterior-to-posterior (SLAP) tear of right shoulder    Healthcare maintenance 07/31/2021   Congenital pes cavus 02/25/2021   History of uterine fibroid 01/05/2021   Herpes 07/12/2020   GERD (gastroesophageal reflux disease) 05/23/2020   Vaginal atrophy 05/23/2020   Cervical spine arthritis 05/23/2020   Osteopenia 05/23/2020   Insomnia  05/20/2020   Postmenopausal hormone therapy 05/20/2020    PCP: Dr. Jolene Cannady   REFERRING PROVIDER: Dr. Lasandra Points   REFERRING DIAG: Left Ankle Fracture   THERAPY DIAG:  Pain in left ankle and joints of left foot  Rationale for Evaluation and Treatment: Rehabilitation  ONSET DATE: 01/05/24 (Surgery Date)   SUBJECTIVE:   SUBJECTIVE STATEMENT: Pt reports that she was able to complete walking exercises without    PERTINENT HISTORY: Pt reports sustaining a left ankle injury on January 11th after sledding into a tree. She sustained a pylon fracture of left ankle and she has since been non-weight bearing in post operative boot. He wounds have healed since and she is experiencing swelling in left foot but this has also reduced since initial surgery. She is to remain non-weight bearing on left ankle until March 12th.  PAIN:  Are you having pain? Yes: NPRS scale: 5/10  Pain location: Dorsal side of right ankle   Pain description: Achy  Aggravating factors: When moving ankle  Relieving factors: Keeping ankle still   PRECAUTIONS: None  RED FLAGS: None   WEIGHT BEARING RESTRICTIONS: Yes Non-weight bearing and needs to use post surgical boot and scooter until March 12th   FALLS:  Has patient fallen in last 6 months? No  LIVING ENVIRONMENT: Lives with: lives with their spouse Lives in: House/apartment Stairs:  Did not ask  Has following  equipment at home: shower chair and Scooter   OCCUPATION: Nurse; works at an outpatient clinic   PLOF: Independent  PATIENT GOALS: To return to walking like she did before the accident.   NEXT MD VISIT: Not sure, end of March   OBJECTIVE:  Note: Objective measures were completed at Evaluation unless otherwise noted.  VITALS:  BP 108/52  HR 93 SpO2 99   DIAGNOSTIC FINDINGS:   CLINICAL DATA:  Ankle trauma, fracture, xray done (Age >= 5y)   EXAM: CT OF THE LEFT ANKLE WITHOUT CONTRAST   TECHNIQUE: Multidetector CT imaging of  the left ankle was performed according to the standard protocol. Multiplanar CT image reconstructions were also generated.   RADIATION DOSE REDUCTION: This exam was performed according to the departmental dose-optimization program which includes automated exposure control, adjustment of the mA and/or kV according to patient size and/or use of iterative reconstruction technique.   COMPARISON:  None Available.   FINDINGS: Bones/Joint/Cartilage   Acute, complex trimalleolar fracture of the left ankle. Vertically oriented fracture through the base of the medial malleolus with mild medial displacement resulting in approximately 4 mm of articular-surface diastasis. Comminuted intra-articular fracture of the tibial plafond and with vertical extension into the distal metaphysis. Approximately 5 mm of articular-surface depression and diastasis. Transversely oriented fracture through the distal fibular metaphysis without significant displacement. Medial subluxation of the talar dome relative to the distal tibia without dislocation.   Bones of the hindfoot and midfoot are intact without fracture or malalignment. Joint spaces are preserved.   Ligaments   Suboptimally assessed by CT.   Muscles and Tendons   No acute musculotendinous abnormality by CT although position of the tibialis posterior tendon and flexor digitorum longus tendons could be at risk for entrapment.   Soft tissues   Soft tissue swelling with ill-defined hematoma about the ankle, more pronounced medially. No soft tissue air to suggest open fracture.   IMPRESSION: 1. Acute, complex trimalleolar fracture of the left ankle, as described above. 2. Medial subluxation of the talar dome relative to the distal tibia without dislocation. 3. Soft tissue swelling with ill-defined hematoma about the ankle, more pronounced medially.     Electronically Signed   By: Duanne Guess D.O.   On: 01/02/2024 14:54    PATIENT  SURVEYS:  LEFS NT   COGNITION: Overall cognitive status: Within functional limits for tasks assessed     SENSATION: Light touch: Impaired   EDEMA:  Figure 8: NT    POSTURE: No Significant postural limitations  PALPATION: Dorsal side of right DIPS TTP   LOWER EXTREMITY ROM:  A/PROM Right eval Left eval Left  03/15/24  Hip flexion     Hip extension     Hip abduction     Hip adduction     Hip internal rotation     Hip external rotation     Knee flexion     Knee extension     Ankle dorsiflexion 10/12 5/7 10  Ankle plantarflexion 50 8/9 40  Ankle inversion 35 10/12 20  Ankle eversion 15 5/5 20   (Blank rows = not tested)    LOWER EXTREMITY MMT:  MMT Right eval Left eval Left  03/15/24  Hip flexion     Hip extension     Hip abduction     Hip adduction     Hip internal rotation     Hip external rotation     Knee flexion     Knee extension  Ankle dorsiflexion (Long Sitting) 4+ 3+ 4  Ankle plantarflexion (Long Sitting) 4+ 4- 4  Ankle inversion 4+ 3+ 4-  Ankle eversion 4+ 3+ 3+   (Blank rows = not tested)   FUNCTIONAL TESTS:  None performed   GAIT:  N/a; non-weight bearing on left foot                                                                                                                                 TREATMENT DATE:   04/07/24: THEREX: All exercises pt donning sneakers instead of boot   Nu-Step seat and arms with resistance 3 for 5 min   Standing Calf Stretch on 4 inch step on LLE 3 x 30 sec   Standing Heel Raise with BUE support and weight shift onto LLE  2 x 10   Dorsiflexion Self-Mobilization with left ankle on 6 inch step  and grey TB 2 x 10  50 ft x 4 with 2ww  -Pt unable to fully weight shift onto LLE due to increased pain -Limited rocker on left foot as a result and decreased step length on right LE    04/05/24: THEREX  Nu-Step seat and arms at 9 for 5 min   GAIT TRAINING: All exercises performed with 2WW and while donning  shoe instead of boot.  25 ft x 2 over ground ambulation with 2WW -Step too gait pattern  Static Stance Phase mid foot to toe off on LLE 2 x 10  25 ft x 6 over ground ambulation with 2WW Step through gait pattern  - min VC to increase base of support and to place most weight through BUE   PHY PERFORMANCE   DF R 4+, PF R 4+, Inv R 4, Ev R 4- DF R 15, PF R 40, Inv R 30, Ev R 20  LEFS 26/80 or 30%   03/31/24:  THEREX   Nu-Step with seat and arms at 7 for 5 min   THERAPEUTIC ACTIVITY: Weight shifts with left foot pad   Lateral Weight Shift with BUE support  1 x 10  Forward Weight Shift onto LLE with intermittent UE support 1 x 10  Forward Weight Shift onto LLE with increased space between feet with intermittent UE support   1 x 10  Forward Weight Shift on  LLE with increased space between feet with 1 UE support 2 x 10  Backward Weight Shift on LLE with narrowed base of support with BUE support 2 x 10   MANUAL THERAPY  Left Ankle- Anterior and posterior glides of talocrural joint grade III-IV 3 x 30  -with ice packed placed over joint to soften pressure  Left Ankle Distraction    03/23/24:  THERAPEUTIC ACTIVITY   Nu-Step with seat and arms at 8 for 5 min  -not donning boot   Lateral weight shift with LLE on foam and use of BUE support  x 30  Forward and Back  weight shift with LLE on foam and use of BUE support x 30  -not donning boot  Sit to Stand with BUE support and LLE on foam without use of boot  1 x 10  -Pt reports increased lateral ankle pain over lateral malleolus  Sit to Stand with BUE support and LLE on foam without use of boot  1 x 10  -Medial wedge to offload lateral ankle joint  -Pt reports no increase in lateral ankle pain    PATIENT EDUCATION:  Education details: Form and technique for correct performance of exercise. Person educated: Patient Education method: Explanation, Demonstration, Verbal cues, and Handouts Education comprehension: verbalized understanding  and returned demonstration  HOME EXERCISE PROGRAM: Access Code: [email or web address] URL: [email or web address] Date: 04/07/2024 Prepared by: Ellin Goodie  Exercises - Seated Figure 4 Ankle Inversion with Resistance  - 3-4 x weekly - 3 sets - 10 reps - Lateral Weight Shift with Arm Raise and Walker  - 3-4 x weekly - 3 sets - 10 reps - Standing Dorsiflexion Self-Mobilization on Step  - 1 x daily - 7 x weekly - 2 sets - 10 reps - 3 sec hold - Heel Raises with Counter Support  - 3-4 x weekly - 3 sets - 10 reps - Standing Bilateral Gastroc Stretch with Step  - 1 x daily - 7 x weekly - 1 sets - 3 reps - 30-60 sec hold  Patient Education - Scar Massage ASSESSMENT:  CLINICAL IMPRESSION: Pt presents s/p 13 weeks ORIF of left ankle. Pt demonstrates ongoing improvement with left ankle strength and stability with ability to perform standing heel raises for the first time. She continues to struggle with weight shift onto left leg while walking likely making it more difficulty to progress from heel to toe. She also continues to have decreased ankle mobility especially with dorisflexion.  She will continue to benefit from skilled PT to address these aforementioned deficits to return to walking and weight bearing on left ankle to return to job and performing self care and household tasks.   OBJECTIVE IMPAIRMENTS: Abnormal gait, difficulty walking, decreased ROM, decreased strength, hypomobility, increased edema, impaired sensation, and pain.   ACTIVITY LIMITATIONS: carrying, lifting, bending, standing, squatting, stairs, bathing, toileting, dressing, hygiene/grooming, locomotion level, and caring for others  PARTICIPATION LIMITATIONS: cleaning, laundry, driving, shopping, community activity, and occupation  PERSONAL FACTORS: Age, Education, Fitness, Profession, and Time since onset of injury/illness/exacerbation are also affecting patient's functional outcome.   REHAB POTENTIAL: Good  CLINICAL  DECISION MAKING: Stable/uncomplicated  EVALUATION COMPLEXITY: Low   GOALS: Goals reviewed with patient? No  SHORT TERM GOALS: Target date: 03/08/2024  Patient will demonstrate undestanding of home exercise plan by performing exercises correctly with evidence of good carry over with min to no verbal or tactile cues when demonstrating exercises. Baseline: NT 03/07/24: Performing exercises independently  Goal status: ACHIEVED    2.  Patient will be able to perform partial weight bearing exercises on LLE as evidence of ongoing healing and improved function in left ankle.  Baseline: Unable to weight bear on left ankle until March 12th  04/05/24: Able to perform to use partial weight bearing.   Goal status: ACHIEVED    LONG TERM GOALS: Target date: 05/03/2024  Patient will improve her LEFS score by >=9 pt to demonstrate a statistically significant improvement in her right ankle function to be able to walk longer distances to collect items for her job. Lujean Rave et al [phone]) Baseline: 11/80 03/16/24: 16.3 % 04/05/24: 26/80 (33%)  Goal status: ONGOING    2.  Patient will demonstrate improved left ankle AROM with range of motion within 20% of right ankle to improve ankle mechanics to ambulate without limitations.  Baseline: Ankle DF R/L 10/5, Ankle PF 50/8, Ankle Inv R/L 35/10, Ankle Ev R/L 15/5  03/15/24: DF R 10,PF R 40, Inv R 20, Ev R 20 04/05/24: DF R 15, PF R 40, Inv R 30, Ev R 20  Goal status: ONGOING   3.  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  Patient will improve left ankle strength to be near symmetrical to right ankle for improved left ankle function to be able to normalize gait and  return to weight bearing activities for her job.  Baseline: Ankle DF R/L 4+/3+, Ankle PF R/L 4+/4-, Ankle Ever R/L 4+/4- , Ankle Inv R/L 4+/4- 03/15/24: DF R 4, PF R 4, Inv R 4-, Ev R 3+ 04/05/24: DF R 4+, PF R 4+, Inv R 4, Ev R 4- Goal status: ONGOING      PLAN:  PT FREQUENCY: 1-2x/week  PT DURATION: 10 weeks  PLANNED INTERVENTIONS: 02725- PT Re-evaluation, 97110-Therapeutic exercises, 97530- Therapeutic activity, 97112- Neuromuscular re-education, 97535- Self Care, [id number]- Manual therapy, [phone]- Gait training, [phone]- Orthotic Fit/training, [phone]- Aquatic Therapy, [phone]- Electrical stimulation (unattended), [phone]- Electrical stimulation (manual), 97597- Wound care (first 20 sq cm), Patient/Family education, Balance training, Stair training, Dry Needling, Joint mobilization, Joint manipulation, Spinal manipulation, Spinal mobilization, Scar mobilization, DME instructions, Cryotherapy, and Moist heat  PLAN FOR NEXT SESSION:  Continue to progress ambulation with increase weight bearing on LLE and proper gait mechanics: sit to stand, ambulating with increased weight shift and OMEGA Leg Press and step up while in shoe a  Marge Shed PT, DPT  [facility] [address], [place], [address] Phone: [phone]   Fax:  [phone]

## 2024-04-11 ENCOUNTER — Ambulatory Visit: Admitting: Physical Therapy

## 2024-04-11 DIAGNOSIS — M25572 Pain in left ankle and joints of left foot: Secondary | ICD-10-CM | POA: Diagnosis not present

## 2024-04-11 NOTE — Therapy (Addendum)
 OUTPATIENT PHYSICAL THERAPY LOWER EXTREMITY TREATMENT   Patient Name: [name] MRN: [id number] DOB:[birth year], 63 y.o., female Today's Date: 04/11/2024  END OF SESSION:  PT End of Session - 04/11/24 1358     Visit Number 12    Number of Visits 20    Date for PT Re-Evaluation 05/03/24    Authorization Type [id number]    Authorization - Visit Number 12    Authorization - Number of Visits 20    Progress Note Due on Visit 20    PT Start Time 1350    PT Stop Time 1430    PT Time Calculation (min) 40 min    Activity Tolerance Patient tolerated treatment well    Behavior During Therapy WFL for tasks assessed/performed                  Past Medical History:  Diagnosis Date   Complication of anesthesia    PONV (postoperative nausea and vomiting)    Past Surgical History:  Procedure Laterality Date   AUGMENTATION MAMMAPLASTY Bilateral 2010   BICEPT TENODESIS Right 09/24/2021   Procedure: BICEPS TENODESIS;  Surgeon: Jasmine Mesi, MD;  Location: [facility] OR;  Service: Orthopedics;  Laterality: Right;   CERVICAL SPINE SURGERY  2003   C5-C7- double dissectomy   PAROTID GLAND TUMOR EXCISION     SHOULDER ARTHROSCOPY Right 09/24/2021   Procedure: right shoulder arthroscopy, debridement,open rotator cuff tear repair;  Surgeon: Jasmine Mesi, MD;  Location: [facility] OR;  Service: Orthopedics;  Laterality: Right;   WISDOM TOOTH EXTRACTION Bilateral    Patient Active Problem List   Diagnosis Date Noted   Elevated low density lipoprotein (LDL) cholesterol level 08/01/2023   Complete tear of right rotator cuff    Degenerative superior labral anterior-to-posterior (SLAP) tear of right shoulder    Healthcare maintenance 07/31/2021   Congenital pes cavus 02/25/2021   History of uterine fibroid 01/05/2021   Herpes 07/12/2020   GERD (gastroesophageal reflux disease) 05/23/2020   Vaginal atrophy 05/23/2020   Cervical spine arthritis 05/23/2020   Osteopenia 05/23/2020   Insomnia  05/20/2020   Postmenopausal hormone therapy 05/20/2020    PCP: Dr. Jolene Cannady   REFERRING PROVIDER: Dr. Lasandra Points   REFERRING DIAG: Left Ankle Fracture   THERAPY DIAG:  Pain in left ankle and joints of left foot  Rationale for Evaluation and Treatment: Rehabilitation  ONSET DATE: 01/05/24 (Surgery Date)   SUBJECTIVE:   SUBJECTIVE STATEMENT: Pt reports that she continues to walk without boot but that she is limited by pain. She is planning on seeing orthopedist on Wednesday.   PERTINENT HISTORY: Pt reports sustaining a left ankle injury on January 11th after sledding into a tree. She sustained a pylon fracture of left ankle and she has since been non-weight bearing in post operative boot. He wounds have healed since and she is experiencing swelling in left foot but this has also reduced since initial surgery. She is to remain non-weight bearing on left ankle until March 12th.  PAIN:  Are you having pain? Yes: NPRS scale: 5/10  Pain location: Dorsal side of right ankle   Pain description: Achy  Aggravating factors: When moving ankle  Relieving factors: Keeping ankle still   PRECAUTIONS: None  RED FLAGS: None   WEIGHT BEARING RESTRICTIONS: Yes Non-weight bearing and needs to use post surgical boot and scooter until March 12th   FALLS:  Has patient fallen in last 6 months? No  LIVING ENVIRONMENT: Lives with: lives with their  spouse Lives in: House/apartment Stairs:  Did not ask  Has following equipment at home: shower chair and Scooter   OCCUPATION: Nurse; works at an outpatient clinic   PLOF: Independent  PATIENT GOALS: To return to walking like she did before the accident.   NEXT MD VISIT: Not sure, end of March   OBJECTIVE:  Note: Objective measures were completed at Evaluation unless otherwise noted.  VITALS:  BP 108/52  HR 93 SpO2 99   DIAGNOSTIC FINDINGS:   CLINICAL DATA:  Ankle trauma, fracture, xray done (Age >= 5y)   EXAM: CT OF THE  LEFT ANKLE WITHOUT CONTRAST   TECHNIQUE: Multidetector CT imaging of the left ankle was performed according to the standard protocol. Multiplanar CT image reconstructions were also generated.   RADIATION DOSE REDUCTION: This exam was performed according to the departmental dose-optimization program which includes automated exposure control, adjustment of the mA and/or kV according to patient size and/or use of iterative reconstruction technique.   COMPARISON:  None Available.   FINDINGS: Bones/Joint/Cartilage   Acute, complex trimalleolar fracture of the left ankle. Vertically oriented fracture through the base of the medial malleolus with mild medial displacement resulting in approximately 4 mm of articular-surface diastasis. Comminuted intra-articular fracture of the tibial plafond and with vertical extension into the distal metaphysis. Approximately 5 mm of articular-surface depression and diastasis. Transversely oriented fracture through the distal fibular metaphysis without significant displacement. Medial subluxation of the talar dome relative to the distal tibia without dislocation.   Bones of the hindfoot and midfoot are intact without fracture or malalignment. Joint spaces are preserved.   Ligaments   Suboptimally assessed by CT.   Muscles and Tendons   No acute musculotendinous abnormality by CT although position of the tibialis posterior tendon and flexor digitorum longus tendons could be at risk for entrapment.   Soft tissues   Soft tissue swelling with ill-defined hematoma about the ankle, more pronounced medially. No soft tissue air to suggest open fracture.   IMPRESSION: 1. Acute, complex trimalleolar fracture of the left ankle, as described above. 2. Medial subluxation of the talar dome relative to the distal tibia without dislocation. 3. Soft tissue swelling with ill-defined hematoma about the ankle, more pronounced medially.     Electronically  Signed   By: Leverne Reading D.O.   On: 01/02/2024 14:54    PATIENT SURVEYS:  LEFS NT   COGNITION: Overall cognitive status: Within functional limits for tasks assessed     SENSATION: Light touch: Impaired   EDEMA:  Figure 8: NT    POSTURE: No Significant postural limitations  PALPATION: Dorsal side of right DIPS TTP   LOWER EXTREMITY ROM:  A/PROM Right eval Left eval Left  03/15/24  Hip flexion     Hip extension     Hip abduction     Hip adduction     Hip internal rotation     Hip external rotation     Knee flexion     Knee extension     Ankle dorsiflexion 10/12 5/7 10  Ankle plantarflexion 50 8/9 40  Ankle inversion 35 10/12 20  Ankle eversion 15 5/5 20   (Blank rows = not tested)    LOWER EXTREMITY MMT:  MMT Right eval Left eval Left  03/15/24  Hip flexion     Hip extension     Hip abduction     Hip adduction     Hip internal rotation     Hip external rotation  Knee flexion     Knee extension     Ankle dorsiflexion (Long Sitting) 4+ 3+ 4  Ankle plantarflexion (Long Sitting) 4+ 4- 4  Ankle inversion 4+ 3+ 4-  Ankle eversion 4+ 3+ 3+   (Blank rows = not tested)   FUNCTIONAL TESTS:  None performed   GAIT:  N/a; non-weight bearing on left foot                                                                                                                                 TREATMENT DATE:   04/11/24: THEREX:  Recumbent Matrix seat at 9 for 6 min  Standing Marches with BUE support and LLE as stance leg 2 x 10    GAIT TRAINING  TM at 0.6 mph with BUE support  -Left sided antalgic gait; Missing heel strike on LLE and decreased step length on RLE    NMR: Modified Single Leg Stance on LLE  2 x 30 sec   Step Taps with LLE as stance leg and RLE step leg on 4 inch step 1 x 10  Step Taps with  LLE as stance leg and RLE step leg onto 6 inch step 1 x 10     04/07/24: THEREX: All exercises pt donning sneakers instead of boot   Nu-Step seat  and arms with resistance 3 for 5 min   Standing Calf Stretch on 4 inch step on LLE 3 x 30 sec   Standing Heel Raise with BUE support and weight shift onto LLE  2 x 10   Dorsiflexion Self-Mobilization with left ankle on 6 inch step  and grey TB 2 x 10  50 ft x 4 with 2ww  -Pt unable to fully weight shift onto LLE due to increased pain -Limited rocker on left foot as a result and decreased step length on right LE    04/05/24: THEREX  Nu-Step seat and arms at 9 for 5 min   GAIT TRAINING: All exercises performed with 2WW and while donning shoe instead of boot.  25 ft x 2 over ground ambulation with 2WW -Step too gait pattern  Static Stance Phase mid foot to toe off on LLE 2 x 10  25 ft x 6 over ground ambulation with 2WW Step through gait pattern  - min VC to increase base of support and to place most weight through BUE   PHY PERFORMANCE   DF R 4+, PF R 4+, Inv R 4, Ev R 4- DF R 15, PF R 40, Inv R 30, Ev R 20  LEFS 26/80 or 30%  03/31/24:  THEREX   Nu-Step with seat and arms at 7 for 5 min   THERAPEUTIC ACTIVITY: Weight shifts with left foot pad   Lateral Weight Shift with BUE support  1 x 10  Forward Weight Shift onto LLE with intermittent UE support 1 x 10  Forward Weight Shift onto LLE with increased space  between feet with intermittent UE support   1 x 10  Forward Weight Shift on  LLE with increased space between feet with 1 UE support 2 x 10  Backward Weight Shift on LLE with narrowed base of support with BUE support 2 x 10   MANUAL THERAPY  Left Ankle- Anterior and posterior glides of talocrural joint grade III-IV 3 x 30  -with ice packed placed over joint to soften pressure  Left Ankle Distraction   PATIENT EDUCATION:  Education details: Form and technique for correct performance of exercise. Person educated: Patient Education method: Explanation, Demonstration, Verbal cues, and Handouts Education comprehension: verbalized understanding and returned  demonstration  HOME EXERCISE PROGRAM: Access Code: [email or web address] URL: [email or web address] Date: 04/11/2024 Prepared by: Marge Shed  Exercises - Standing Dorsiflexion Self-Mobilization on Step  - 1 x daily - 7 x weekly - 2 sets - 10 reps - 3 sec hold - Heel Raises with Counter Support  - 3-4 x weekly - 3 sets - 10 reps - Standing Bilateral Gastroc Stretch with Step  - 1 x daily - 7 x weekly - 1 sets - 3 reps - 30-60 sec hold - Forward Step Touch  - 3-4 x weekly - 3 sets - 10 reps - Standing March with Counter Support  - 3-4 x weekly - 3 sets - 10 reps  Patient Education - Scar Massage   ASSESSMENT:  CLINICAL IMPRESSION: Pt presents s/p 14 weeks ORIF of left ankle. She continues to progress towards goals with ability to perform modified single leg stance and standing marches without being limited by pain. She does demonstrate left sided antalgic gait with decreased left ankle stability in weight bearing or stance phase as evidenced by decreased RLE progression. She will continue to benefit from skilled PT to address these aforementioned deficits to return to walking and weight bearing on left ankle to return to job and performing self care and household tasks.   OBJECTIVE IMPAIRMENTS: Abnormal gait, difficulty walking, decreased ROM, decreased strength, hypomobility, increased edema, impaired sensation, and pain.   ACTIVITY LIMITATIONS: carrying, lifting, bending, standing, squatting, stairs, bathing, toileting, dressing, hygiene/grooming, locomotion level, and caring for others  PARTICIPATION LIMITATIONS: cleaning, laundry, driving, shopping, community activity, and occupation  PERSONAL FACTORS: Age, Education, Fitness, Profession, and Time since onset of injury/illness/exacerbation are also affecting patient's functional outcome.   REHAB POTENTIAL: Good  CLINICAL DECISION MAKING: Stable/uncomplicated  EVALUATION COMPLEXITY: Low   GOALS: Goals reviewed with  patient? No  SHORT TERM GOALS: Target date: 03/08/2024  Patient will demonstrate undestanding of home exercise plan by performing exercises correctly with evidence of good carry over with min to no verbal or tactile cues when demonstrating exercises. Baseline: NT 03/07/24: Performing exercises independently  Goal status: ACHIEVED    2.  Patient will be able to perform partial weight bearing exercises on LLE as evidence of ongoing healing and improved function in left ankle.  Baseline: Unable to weight bear on left ankle until March 12th  04/05/24: Able to perform to use partial weight bearing.   Goal status: ACHIEVED    LONG TERM GOALS: Target date: 05/03/2024  Patient will improve her LEFS score by >=9 pt to demonstrate a statistically significant improvement in her right ankle function to be able to walk longer distances to collect items for her job. Isom Marin et al 1308) Baseline: 11/80 03/16/24: 16.3 % 04/05/24: 26/80 (33%)            Goal status: ONGOING    2.  Patient will demonstrate improved left ankle AROM with range of motion within 20% of right ankle to improve ankle mechanics to ambulate without limitations.  Baseline: Ankle DF R/L 10/5, Ankle PF 50/8, Ankle Inv R/L 35/10, Ankle Ev R/L 15/5  03/15/24: DF R 10,PF R 40, Inv R 20, Ev R 20 04/05/24: DF R 15, PF R 40, Inv R 30, Ev R 20  Goal status: ONGOING   3.  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  Patient will improve left ankle strength to be near symmetrical to right ankle for improved left ankle function to be able to normalize gait and return to weight bearing activities for her job.  Baseline: Ankle DF R/L 4+/3+, Ankle PF R/L 4+/4-,  Ankle Ever R/L 4+/4- , Ankle Inv R/L 4+/4- 03/15/24: DF R 4, PF R 4, Inv R 4-, Ev R 3+ 04/05/24: DF R 4+, PF R 4+, Inv R 4, Ev R 4- Goal status: ONGOING      PLAN:  PT FREQUENCY: 1-2x/week  PT DURATION: 10 weeks  PLANNED INTERVENTIONS: 97164- PT Re-evaluation, 97110-Therapeutic exercises, 97530- Therapeutic activity, [id number]- Neuromuscular re-education, 97535- Self Care, 16109- Manual therapy, [id number]- Gait training, [phone]- Orthotic Fit/training, [phone]- Aquatic Therapy, [phone]- Electrical stimulation (unattended), [phone]- Electrical stimulation (manual), 97597- Wound care (first 20 sq cm), Patient/Family education, Balance training, Stair training, Dry Needling, Joint mobilization, Joint manipulation, Spinal manipulation, Spinal mobilization, Scar mobilization, DME instructions, Cryotherapy, and Moist heat  PLAN FOR NEXT SESSION:  Continue with standing strengthening and balance exercises for LLE balance and strengthening: hip abduction. Modified single leg stance cone taps.   Marge Shed PT, DPT  [facility] [address], [place], [address] Phone: [phone]   Fax:  [phone]

## 2024-04-13 ENCOUNTER — Ambulatory Visit: Admitting: Physical Therapy

## 2024-04-13 DIAGNOSIS — M25572 Pain in left ankle and joints of left foot: Secondary | ICD-10-CM | POA: Diagnosis not present

## 2024-04-14 ENCOUNTER — Ambulatory Visit: Admitting: Physical Therapy

## 2024-04-14 DIAGNOSIS — M25572 Pain in left ankle and joints of left foot: Secondary | ICD-10-CM | POA: Diagnosis not present

## 2024-04-14 NOTE — Therapy (Signed)
 OUTPATIENT PHYSICAL THERAPY LOWER EXTREMITY TREATMENT   Patient Name: [name] MRN: [id number] DOB:1961/08/12, 63 y.o., female Today's Date: 04/14/2024  END OF SESSION:  PT End of Session - 04/14/24 1437     Visit Number 13    Number of Visits 20    Date for PT Re-Evaluation 05/03/24    Authorization Type [id number]    Authorization - Visit Number 13    Authorization - Number of Visits 20    Progress Note Due on Visit 20    PT Start Time 1435    PT Stop Time 1515    PT Time Calculation (min) 40 min    Activity Tolerance Patient tolerated treatment well    Behavior During Therapy WFL for tasks assessed/performed                   Past Medical History:  Diagnosis Date   Complication of anesthesia    PONV (postoperative nausea and vomiting)    Past Surgical History:  Procedure Laterality Date   AUGMENTATION MAMMAPLASTY Bilateral 2010   BICEPT TENODESIS Right 09/24/2021   Procedure: BICEPS TENODESIS;  Surgeon: Jasmine Mesi, MD;  Location: [facility] OR;  Service: Orthopedics;  Laterality: Right;   CERVICAL SPINE SURGERY  2003   C5-C7- double dissectomy   PAROTID GLAND TUMOR EXCISION     SHOULDER ARTHROSCOPY Right 09/24/2021   Procedure: right shoulder arthroscopy, debridement,open rotator cuff tear repair;  Surgeon: Jasmine Mesi, MD;  Location: [facility] OR;  Service: Orthopedics;  Laterality: Right;   WISDOM TOOTH EXTRACTION Bilateral    Patient Active Problem List   Diagnosis Date Noted   Elevated low density lipoprotein (LDL) cholesterol level 08/01/2023   Complete tear of right rotator cuff    Degenerative superior labral anterior-to-posterior (SLAP) tear of right shoulder    Healthcare maintenance 07/31/2021   Congenital pes cavus 02/25/2021   History of uterine fibroid 01/05/2021   Herpes 07/12/2020   GERD (gastroesophageal reflux disease) 05/23/2020   Vaginal atrophy 05/23/2020   Cervical spine arthritis 05/23/2020   Osteopenia 05/23/2020   Insomnia  05/20/2020   Postmenopausal hormone therapy 05/20/2020    PCP: Dr. Doran Galloway   REFERRING PROVIDER: Dr. Lasandra Points   REFERRING DIAG: Left Ankle Fracture   THERAPY DIAG:  No diagnosis found.  Rationale for Evaluation and Treatment: Rehabilitation  ONSET DATE: 01/05/24 (Surgery Date)   SUBJECTIVE:   SUBJECTIVE STATEMENT: Pt states that she saw orthopedist who said that fractures are healing but she may have scare tissue on the anterior of the ankle. He also suggested that she try pool for walking, because of the left ankle pain.   PERTINENT HISTORY: Pt reports sustaining a left ankle injury on January 11th after sledding into a tree. She sustained a pylon fracture of left ankle and she has since been non-weight bearing in post operative boot. He wounds have healed since and she is experiencing swelling in left foot but this has also reduced since initial surgery. She is to remain non-weight bearing on left ankle until March 12th.  PAIN:  Are you having pain? Yes: NPRS scale: 5/10  Pain location: Dorsal side of right ankle   Pain description: Achy  Aggravating factors: When moving ankle  Relieving factors: Keeping ankle still   PRECAUTIONS: None  RED FLAGS: None   WEIGHT BEARING RESTRICTIONS: Yes Non-weight bearing and needs to use post surgical boot and scooter until March 12th   FALLS:  Has patient fallen in last 6  months? No  LIVING ENVIRONMENT: Lives with: lives with their spouse Lives in: House/apartment Stairs:  Did not ask  Has following equipment at home: shower chair and Scooter   OCCUPATION: Nurse; works at an outpatient clinic   PLOF: Independent  PATIENT GOALS: To return to walking like she did before the accident.   NEXT MD VISIT: Not sure, end of March   OBJECTIVE:  Note: Objective measures were completed at Evaluation unless otherwise noted.  VITALS:  BP 108/52  HR 93 SpO2 99   DIAGNOSTIC FINDINGS:   CLINICAL DATA:  Ankle trauma,  fracture, xray done (Age >= 5y)   EXAM: CT OF THE LEFT ANKLE WITHOUT CONTRAST   TECHNIQUE: Multidetector CT imaging of the left ankle was performed according to the standard protocol. Multiplanar CT image reconstructions were also generated.   RADIATION DOSE REDUCTION: This exam was performed according to the departmental dose-optimization program which includes automated exposure control, adjustment of the mA and/or kV according to patient size and/or use of iterative reconstruction technique.   COMPARISON:  None Available.   FINDINGS: Bones/Joint/Cartilage   Acute, complex trimalleolar fracture of the left ankle. Vertically oriented fracture through the base of the medial malleolus with mild medial displacement resulting in approximately 4 mm of articular-surface diastasis. Comminuted intra-articular fracture of the tibial plafond and with vertical extension into the distal metaphysis. Approximately 5 mm of articular-surface depression and diastasis. Transversely oriented fracture through the distal fibular metaphysis without significant displacement. Medial subluxation of the talar dome relative to the distal tibia without dislocation.   Bones of the hindfoot and midfoot are intact without fracture or malalignment. Joint spaces are preserved.   Ligaments   Suboptimally assessed by CT.   Muscles and Tendons   No acute musculotendinous abnormality by CT although position of the tibialis posterior tendon and flexor digitorum longus tendons could be at risk for entrapment.   Soft tissues   Soft tissue swelling with ill-defined hematoma about the ankle, more pronounced medially. No soft tissue air to suggest open fracture.   IMPRESSION: 1. Acute, complex trimalleolar fracture of the left ankle, as described above. 2. Medial subluxation of the talar dome relative to the distal tibia without dislocation. 3. Soft tissue swelling with ill-defined hematoma about the  ankle, more pronounced medially.     Electronically Signed   By: Leverne Reading D.O.   On: 01/02/2024 14:54    PATIENT SURVEYS:  LEFS NT   COGNITION: Overall cognitive status: Within functional limits for tasks assessed     SENSATION: Light touch: Impaired   EDEMA:  Figure 8: NT    POSTURE: No Significant postural limitations  PALPATION: Dorsal side of right DIPS TTP   LOWER EXTREMITY ROM:  A/PROM Right eval Left eval Left  03/15/24  Hip flexion     Hip extension     Hip abduction     Hip adduction     Hip internal rotation     Hip external rotation     Knee flexion     Knee extension     Ankle dorsiflexion 10/12 5/7 10  Ankle plantarflexion 50 8/9 40  Ankle inversion 35 10/12 20  Ankle eversion 15 5/5 20   (Blank rows = not tested)    LOWER EXTREMITY MMT:  MMT Right eval Left eval Left  03/15/24  Hip flexion     Hip extension     Hip abduction     Hip adduction     Hip internal rotation  Hip external rotation     Knee flexion     Knee extension     Ankle dorsiflexion (Long Sitting) 4+ 3+ 4  Ankle plantarflexion (Long Sitting) 4+ 4- 4  Ankle inversion 4+ 3+ 4-  Ankle eversion 4+ 3+ 3+   (Blank rows = not tested)   FUNCTIONAL TESTS:  None performed   GAIT:  N/a; non-weight bearing on left foot                                                                                                                                 TREATMENT DATE:   04/14/24: THEREX Recumbent Matrix seat at 11 for 6 min  Sit to Stand at 22" mat height 3 x 10   NMR: LLE as stance limb  Step Taps on 5 inch cone 1 x 10  Step Taps on 4 inch yoga block 1 x 10  Step Taps on 6 inch yoga bock 1 x 10   GAIT TRAINING   10 ft x 10 with heel toe walking with 1 UE support   10 ft x 10 with heel toe walking with emphasis on step length with 1 UE support     04/11/24: THEREX:  Recumbent Matrix seat at 9 for 6 min  Standing Marches with BUE support and LLE as stance  leg 2 x 10    GAIT TRAINING  TM at 0.6 mph with BUE support  -Left sided antalgic gait; Missing heel strike on LLE and decreased step length on RLE    NMR: Modified Single Leg Stance on LLE  2 x 30 sec   Step Taps with LLE as stance leg and RLE step leg on 4 inch step 1 x 10  Step Taps with  LLE as stance leg and RLE step leg onto 6 inch step 1 x 10     04/07/24: THEREX: All exercises pt donning sneakers instead of boot   Nu-Step seat and arms with resistance 3 for 5 min   Standing Calf Stretch on 4 inch step on LLE 3 x 30 sec   Standing Heel Raise with BUE support and weight shift onto LLE  2 x 10   Dorsiflexion Self-Mobilization with left ankle on 6 inch step  and grey TB 2 x 10  50 ft x 4 with 2ww  -Pt unable to fully weight shift onto LLE due to increased pain -Limited rocker on left foot as a result and decreased step length on right LE    04/05/24: THEREX  Nu-Step seat and arms at 9 for 5 min   GAIT TRAINING: All exercises performed with 2WW and while donning shoe instead of boot.  25 ft x 2 over ground ambulation with 2WW -Step too gait pattern  Static Stance Phase mid foot to toe off on LLE 2 x 10  25 ft x 6 over ground ambulation with 2WW Step through gait pattern  - min  VC to increase base of support and to place most weight through BUE   PHY PERFORMANCE   DF R 4+, PF R 4+, Inv R 4, Ev R 4- DF R 15, PF R 40, Inv R 30, Ev R 20  LEFS 26/80 or 30%    PATIENT EDUCATION:  Education details: Form and technique for correct performance of exercise. Person educated: Patient Education method: Explanation, Demonstration, Verbal cues, and Handouts Education comprehension: verbalized understanding and returned demonstration  HOME EXERCISE PROGRAM: Access Code: [email or web address] URL: [email or web address] Date: 04/14/2024 Prepared by: Marge Shed  Exercises - Heel-Toe Walking  - 1 x daily - 7 x weekly - 10 reps - Standing Dorsiflexion Self-Mobilization on Step   - 1 x daily - 7 x weekly - 2 sets - 10 reps - 3 sec hold - Standing Bilateral Gastroc Stretch with Step  - 1 x daily - 7 x weekly - 1 sets - 3 reps - 30-60 sec hold - Heel Toe Raises with Counter Support  - 3-4 x weekly - 3 sets - 10 reps  Patient Education - Scar Massage   ASSESSMENT:  CLINICAL IMPRESSION: Pt presents s/p 14 weeks ORIF of left ankle. She demonstrates improvement with gait mechanics with ability to perform heel toe walking with increased step length bilaterally. Pt was not limited by anterior ankle pain during session, which could have been from her donning brace throughout session for added support. She will continue to benefit from skilled PT to address these aforementioned deficits to return to walking and weight bearing on left ankle to return to job and performing self care and household tasks.   OBJECTIVE IMPAIRMENTS: Abnormal gait, difficulty walking, decreased ROM, decreased strength, hypomobility, increased edema, impaired sensation, and pain.   ACTIVITY LIMITATIONS: carrying, lifting, bending, standing, squatting, stairs, bathing, toileting, dressing, hygiene/grooming, locomotion level, and caring for others  PARTICIPATION LIMITATIONS: cleaning, laundry, driving, shopping, community activity, and occupation  PERSONAL FACTORS: Age, Education, Fitness, Profession, and Time since onset of injury/illness/exacerbation are also affecting patient's functional outcome.   REHAB POTENTIAL: Good  CLINICAL DECISION MAKING: Stable/uncomplicated  EVALUATION COMPLEXITY: Low   GOALS: Goals reviewed with patient? No  SHORT TERM GOALS: Target date: 03/08/2024  Patient will demonstrate undestanding of home exercise plan by performing exercises correctly with evidence of good carry over with min to no verbal or tactile cues when demonstrating exercises. Baseline: NT 03/07/24: Performing exercises independently  Goal status: ACHIEVED    2.  Patient will be able to perform  partial weight bearing exercises on LLE as evidence of ongoing healing and improved function in left ankle.  Baseline: Unable to weight bear on left ankle until March 12th  04/05/24: Able to perform to use partial weight bearing.   Goal status: ACHIEVED    LONG TERM GOALS: Target date: 05/03/2024  Patient will improve her LEFS score by >=9 pt to demonstrate a statistically significant improvement in her right ankle function to be able to walk longer distances to collect items for her job. Isom Marin et al [id number]) Baseline: 11/80 03/16/24: 16.3 % 04/05/24: 26/80 (33%)            Goal status: ONGOING    2.  Patient will demonstrate improved left ankle AROM with range of motion within 20% of right ankle to improve ankle mechanics to ambulate without limitations.  Baseline: Ankle DF R/L 10/5, Ankle PF 50/8, Ankle Inv R/L 35/10, Ankle Ev R/L 15/5  03/15/24: DF R 10,PF R 40, Inv R 20, Ev  R 20 04/05/24: DF R 15, PF R 40, Inv R 30, Ev R 20  Goal status: ONGOING   3.  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  Patient will improve left ankle strength to be near symmetrical to right ankle for improved left ankle function to be able to normalize gait and return to weight bearing activities for her job.  Baseline: Ankle DF R/L 4+/3+, Ankle PF R/L 4+/4-, Ankle Ever R/L 4+/4- , Ankle Inv R/L 4+/4- 03/15/24: DF R 4, PF R 4, Inv R 4-, Ev R 3+ 04/05/24: DF R 4+, PF R 4+, Inv R 4, Ev R 4- Goal status: ONGOING      PLAN:  PT FREQUENCY: 1-2x/week  PT DURATION: 10 weeks  PLANNED INTERVENTIONS: 97164- PT Re-evaluation, 97110-Therapeutic exercises, 97530- Therapeutic activity, [id number]- Neuromuscular re-education, 97535- Self  Care, [id number]- Manual therapy, [id number]- Gait training, [phone]- Orthotic Fit/training, [phone]- Aquatic Therapy, [phone]- Electrical stimulation (unattended), [phone]- Electrical stimulation (manual), 97597- Wound care (first 20 sq cm), Patient/Family education, Balance training, Stair training, Dry Needling, Joint mobilization, Joint manipulation, Spinal manipulation, Spinal mobilization, Scar mobilization, DME instructions, Cryotherapy, and Moist heat  PLAN FOR NEXT SESSION:  Continue with gait training and single leg balance exercises: heel to toe walking with decreased UE support. Heel raises and attempt single leg stance.   Marge Shed PT, DPT  [facility] [address] [place], [address] Phone: [phone]   Fax:  [phone]

## 2024-04-18 ENCOUNTER — Ambulatory Visit: Admitting: Physical Therapy

## 2024-04-20 ENCOUNTER — Ambulatory Visit: Admitting: Physical Therapy

## 2024-04-20 DIAGNOSIS — M25572 Pain in left ankle and joints of left foot: Secondary | ICD-10-CM | POA: Diagnosis not present

## 2024-04-20 NOTE — Therapy (Addendum)
 OUTPATIENT PHYSICAL THERAPY LOWER EXTREMITY TREATMENT   Patient Name: [name] MRN: [id number] DOB:03/30/1961, 63 y.o., female Today's Date: 04/20/2024  END OF SESSION:  PT End of Session - 04/20/24 0951     Visit Number 14    Number of Visits 20    Date for PT Re-Evaluation 05/03/24    Authorization Type [id number]    Authorization - Visit Number 14    Authorization - Number of Visits 20    Progress Note Due on Visit 20    PT Start Time 0950    PT Stop Time 1030    PT Time Calculation (min) 40 min    Activity Tolerance Patient tolerated treatment well    Behavior During Therapy WFL for tasks assessed/performed                    Past Medical History:  Diagnosis Date   Complication of anesthesia    PONV (postoperative nausea and vomiting)    Past Surgical History:  Procedure Laterality Date   AUGMENTATION MAMMAPLASTY Bilateral 2010   BICEPT TENODESIS Right 09/24/2021   Procedure: BICEPS TENODESIS;  Surgeon: Jasmine Mesi, MD;  Location: [facility] OR;  Service: Orthopedics;  Laterality: Right;   CERVICAL SPINE SURGERY  2003   C5-C7- double dissectomy   PAROTID GLAND TUMOR EXCISION     SHOULDER ARTHROSCOPY Right 09/24/2021   Procedure: right shoulder arthroscopy, debridement,open rotator cuff tear repair;  Surgeon: Jasmine Mesi, MD;  Location: [facility] OR;  Service: Orthopedics;  Laterality: Right;   WISDOM TOOTH EXTRACTION Bilateral    Patient Active Problem List   Diagnosis Date Noted   Elevated low density lipoprotein (LDL) cholesterol level 08/01/2023   Complete tear of right rotator cuff    Degenerative superior labral anterior-to-posterior (SLAP) tear of right shoulder    Healthcare maintenance 07/31/2021   Congenital pes cavus 02/25/2021   History of uterine fibroid 01/05/2021   Herpes 07/12/2020   GERD (gastroesophageal reflux disease) 05/23/2020   Vaginal atrophy 05/23/2020   Cervical spine arthritis 05/23/2020   Osteopenia 05/23/2020    Insomnia 05/20/2020   Postmenopausal hormone therapy 05/20/2020    PCP: Dr. Jolene Cannady   REFERRING PROVIDER: Dr. Lasandra Points   REFERRING DIAG: Left Ankle Fracture   THERAPY DIAG:  Pain in left ankle and joints of left foot  Rationale for Evaluation and Treatment: Rehabilitation  ONSET DATE: 01/05/24 (Surgery Date)   SUBJECTIVE:   SUBJECTIVE STATEMENT: Pt reports that she has now transitioned to using 2WW from the rover. She continues to attempt to walk but she feels ongoing pain while walking that limits her.   PERTINENT HISTORY: Pt reports sustaining a left ankle injury on January 11th after sledding into a tree. She sustained a pylon fracture of left ankle and she has since been non-weight bearing in post operative boot. He wounds have healed since and she is experiencing swelling in left foot but this has also reduced since initial surgery. She is to remain non-weight bearing on left ankle until March 12th.  PAIN:  Are you having pain? Yes: NPRS scale: 3/10  Pain location: Dorsal side of right ankle   Pain description: Achy  Aggravating factors: When moving ankle  Relieving factors: Keeping ankle still   PRECAUTIONS: None  RED FLAGS: None   WEIGHT BEARING RESTRICTIONS: Yes Non-weight bearing and needs to use post surgical boot and scooter until March 12th   FALLS:  Has patient fallen in last 6 months? No  LIVING ENVIRONMENT: Lives with: lives with their spouse Lives in: House/apartment Stairs:  Did not ask  Has following equipment at home: shower chair and Scooter   OCCUPATION: Nurse; works at an outpatient clinic   PLOF: Independent  PATIENT GOALS: To return to walking like she did before the accident.   NEXT MD VISIT: Not sure, end of March   OBJECTIVE:  Note: Objective measures were completed at Evaluation unless otherwise noted.  VITALS:  BP 108/52  HR 93 SpO2 99   DIAGNOSTIC FINDINGS:   CLINICAL DATA:  Ankle trauma, fracture, xray done  (Age >= 5y)   EXAM: CT OF THE LEFT ANKLE WITHOUT CONTRAST   TECHNIQUE: Multidetector CT imaging of the left ankle was performed according to the standard protocol. Multiplanar CT image reconstructions were also generated.   RADIATION DOSE REDUCTION: This exam was performed according to the departmental dose-optimization program which includes automated exposure control, adjustment of the mA and/or kV according to patient size and/or use of iterative reconstruction technique.   COMPARISON:  None Available.   FINDINGS: Bones/Joint/Cartilage   Acute, complex trimalleolar fracture of the left ankle. Vertically oriented fracture through the base of the medial malleolus with mild medial displacement resulting in approximately 4 mm of articular-surface diastasis. Comminuted intra-articular fracture of the tibial plafond and with vertical extension into the distal metaphysis. Approximately 5 mm of articular-surface depression and diastasis. Transversely oriented fracture through the distal fibular metaphysis without significant displacement. Medial subluxation of the talar dome relative to the distal tibia without dislocation.   Bones of the hindfoot and midfoot are intact without fracture or malalignment. Joint spaces are preserved.   Ligaments   Suboptimally assessed by CT.   Muscles and Tendons   No acute musculotendinous abnormality by CT although position of the tibialis posterior tendon and flexor digitorum longus tendons could be at risk for entrapment.   Soft tissues   Soft tissue swelling with ill-defined hematoma about the ankle, more pronounced medially. No soft tissue air to suggest open fracture.   IMPRESSION: 1. Acute, complex trimalleolar fracture of the left ankle, as described above. 2. Medial subluxation of the talar dome relative to the distal tibia without dislocation. 3. Soft tissue swelling with ill-defined hematoma about the ankle, more pronounced  medially.     Electronically Signed   By: Leverne Reading D.O.   On: 01/02/2024 14:54    PATIENT SURVEYS:  LEFS NT   COGNITION: Overall cognitive status: Within functional limits for tasks assessed     SENSATION: Light touch: Impaired   EDEMA:  Figure 8: NT    POSTURE: No Significant postural limitations  PALPATION: Dorsal side of right DIPS TTP   LOWER EXTREMITY ROM:  A/PROM Right eval Left eval Left  03/15/24  Hip flexion     Hip extension     Hip abduction     Hip adduction     Hip internal rotation     Hip external rotation     Knee flexion     Knee extension     Ankle dorsiflexion 10/12 5/7 10  Ankle plantarflexion 50 8/9 40  Ankle inversion 35 10/12 20  Ankle eversion 15 5/5 20   (Blank rows = not tested)    LOWER EXTREMITY MMT:  MMT Right eval Left eval Left  03/15/24  Hip flexion     Hip extension     Hip abduction     Hip adduction     Hip internal rotation  Hip external rotation     Knee flexion     Knee extension     Ankle dorsiflexion (Long Sitting) 4+ 3+ 4  Ankle plantarflexion (Long Sitting) 4+ 4- 4  Ankle inversion 4+ 3+ 4-  Ankle eversion 4+ 3+ 3+   (Blank rows = not tested)   FUNCTIONAL TESTS:  None performed   GAIT:  N/a; non-weight bearing on left foot                                                                                                                                 TREATMENT DATE:   04/20/24:   THERAC   TM at  0.5 mph for 10 min with BUE support  -mod VC to increased upright posture and toe off Standing Heel Raises with BUE support 1 x 10  Standing Heel Raises with 1 UE support 2 x 10   GAIT TRAINING  Stationary Heel to toe rocker on LLE with BUE using 2WW for 5 min Stationary Heel to toe rocker on LLE with 1 UE against wall x 30  Stationary Heel to toe rocker on LLE with 1 UE against wall with external cue tape x 30  -min VC to decrease LLE scissoring or not stepping over line that bisects  patient's body.      04/14/24: THEREX Recumbent Matrix seat at 11 for 6 min  Sit to Stand at 22" mat height 3 x 10   NMR: LLE as stance limb  Step Taps on 5 inch cone 1 x 10  Step Taps on 4 inch yoga block 1 x 10  Step Taps on 6 inch yoga bock 1 x 10   GAIT TRAINING   10 ft x 10 with heel toe walking with 1 UE support   10 ft x 10 with heel toe walking with emphasis on step length with 1 UE support     04/11/24: THEREX:  Recumbent Matrix seat at 9 for 6 min  Standing Marches with BUE support and LLE as stance leg 2 x 10    GAIT TRAINING  TM at 0.6 mph with BUE support  -Left sided antalgic gait; Missing heel strike on LLE and decreased step length on RLE    NMR: Modified Single Leg Stance on LLE  2 x 30 sec   Step Taps with LLE as stance leg and RLE step leg on 4 inch step 1 x 10  Step Taps with  LLE as stance leg and RLE step leg onto 6 inch step 1 x 10     04/07/24: THEREX: All exercises pt donning sneakers instead of boot   Nu-Step seat and arms with resistance 3 for 5 min   Standing Calf Stretch on 4 inch step on LLE 3 x 30 sec   Standing Heel Raise with BUE support and weight shift onto LLE  2 x 10   Dorsiflexion Self-Mobilization with left ankle on 6  inch step  and grey TB 2 x 10  50 ft x 4 with 2ww  -Pt unable to fully weight shift onto LLE due to increased pain -Limited rocker on left foot as a result and decreased step length on right LE    04/05/24: THEREX  Nu-Step seat and arms at 9 for 5 min   GAIT TRAINING: All exercises performed with 2WW and while donning shoe instead of boot.  25 ft x 2 over ground ambulation with 2WW -Step too gait pattern  Static Stance Phase mid foot to toe off on LLE 2 x 10  25 ft x 6 over ground ambulation with 2WW Step through gait pattern  - min VC to increase base of support and to place most weight through BUE   PHY PERFORMANCE   DF R 4+, PF R 4+, Inv R 4, Ev R 4- DF R 15, PF R 40, Inv R 30, Ev R 20  LEFS 26/80 or  30%    PATIENT EDUCATION:  Education details: Form and technique for correct performance of exercise. Person educated: Patient Education method: Explanation, Demonstration, Verbal cues, and Handouts Education comprehension: verbalized understanding and returned demonstration  HOME EXERCISE PROGRAM: Access Code: [email or web address] URL: [email or web address] Date: 04/20/2024 Prepared by: Marge Shed  Exercises - Heel-Toe Walking  - 1 x daily - 7 x weekly - 10 reps - Standing Dorsiflexion Self-Mobilization on Step  - 1 x daily - 7 x weekly - 2 sets - 10 reps - 3 sec hold - Standing Bilateral Gastroc Stretch with Step  - 1 x daily - 7 x weekly - 1 sets - 3 reps - 30-60 sec hold - Heel Toe Raises with Counter Support  - 3-4 x weekly - 3 sets - 10 reps -Stationary Rocker     ASSESSMENT:  CLINICAL IMPRESSION: Pt presents s/p 15 weeks ORIF of left ankle. Pt demonstrates improvement with left ankle function with ability to perform rocker progression from heel contact onto forefoot with only 1 UE support and without compensation. She did show scissoring initially with RLE stepping over midline, but she was able to recorrect with minimal external cueing. She will continue to benefit from skilled PT to address these aforementioned deficits to return to walking and weight bearing on left ankle to return to job and performing self care and household tasks.   OBJECTIVE IMPAIRMENTS: Abnormal gait, difficulty walking, decreased ROM, decreased strength, hypomobility, increased edema, impaired sensation, and pain.   ACTIVITY LIMITATIONS: carrying, lifting, bending, standing, squatting, stairs, bathing, toileting, dressing, hygiene/grooming, locomotion level, and caring for others  PARTICIPATION LIMITATIONS: cleaning, laundry, driving, shopping, community activity, and occupation  PERSONAL FACTORS: Age, Education, Fitness, Profession, and Time since onset of injury/illness/exacerbation are  also affecting patient's functional outcome.   REHAB POTENTIAL: Good  CLINICAL DECISION MAKING: Stable/uncomplicated  EVALUATION COMPLEXITY: Low   GOALS: Goals reviewed with patient? No  SHORT TERM GOALS: Target date: 03/08/2024  Patient will demonstrate undestanding of home exercise plan by performing exercises correctly with evidence of good carry over with min to no verbal or tactile cues when demonstrating exercises. Baseline: NT 03/07/24: Performing exercises independently  Goal status: ACHIEVED    2.  Patient will be able to perform partial weight bearing exercises on LLE as evidence of ongoing healing and improved function in left ankle.  Baseline: Unable to weight bear on left ankle until March 12th  04/05/24: Able to perform to use partial weight bearing.   Goal status: ACHIEVED  LONG TERM GOALS: Target date: 05/03/2024  Patient will improve her LEFS score by >=9 pt to demonstrate a statistically significant improvement in her right ankle function to be able to walk longer distances to collect items for her job. Isom Marin et al 2130) Baseline: 11/80 03/16/24: 16.3 % 04/05/24: 26/80 (33%)            Goal status: ONGOING    2.  Patient will demonstrate improved left ankle AROM with range of motion within 20% of right ankle to improve ankle mechanics to ambulate without limitations.  Baseline: Ankle DF R/L 10/5, Ankle PF 50/8, Ankle Inv R/L 35/10, Ankle Ev R/L 15/5  03/15/24: DF R 10,PF R 40, Inv R 20, Ev R 20 04/05/24: DF R 15, PF R 40, Inv R 30, Ev R 20  Goal status: ONGOING   3.  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  Patient will improve left ankle strength to be near  symmetrical to right ankle for improved left ankle function to be able to normalize gait and return to weight bearing activities for her job.  Baseline: Ankle DF R/L 4+/3+, Ankle PF R/L 4+/4-, Ankle Ever R/L 4+/4- , Ankle Inv R/L 4+/4- 03/15/24: DF R 4, PF R 4, Inv R 4-, Ev R 3+ 04/05/24: DF R 4+, PF R 4+, Inv R 4, Ev R 4- Goal status: ONGOING      PLAN:  PT FREQUENCY: 1-2x/week  PT DURATION: 10 weeks  PLANNED INTERVENTIONS: 86578- PT Re-evaluation, 97110-Therapeutic exercises, 97530- Therapeutic activity, 97112- Neuromuscular re-education, 97535- Self Care, 46962- Manual therapy, [phone]- Gait training, [phone]- Orthotic Fit/training, [phone]- Aquatic Therapy, [phone]- Electrical stimulation (unattended), [phone]- Electrical stimulation (manual), 97597- Wound care (first 20 sq cm), Patient/Family education, Balance training, Stair training, Dry Needling, Joint mobilization, Joint manipulation, Spinal manipulation, Spinal mobilization, Scar mobilization, DME instructions, Cryotherapy, and Moist heat  PLAN FOR NEXT SESSION: Walk with use of trekking pole or walking stick. Continue with left ankle strengthening with static balance with tandem or single leg stance exercises. Continue to progress heel raises and toe raises. Aquatic therapy: heel and toe walking, balance on LLE, and side shuffles.    Marge Shed PT, DPT  [facility] [address], [place], [address] Phone: [phone]   Fax:  [phone]

## 2024-04-26 ENCOUNTER — Telehealth: Payer: Self-pay | Admitting: Physical Therapy

## 2024-04-26 NOTE — Telephone Encounter (Signed)
 Called pt to inform them that their usual therapist would not be present at next session and that substitute physical therapist would be present. Did not reach, so left VM instructing pt to call back if they wanted to reschedule.

## 2024-04-26 NOTE — Telephone Encounter (Signed)
 Called pt to inform them that coordinating therapist would not be present at next appointment and that she would be seeing a substitute therapist.

## 2024-04-27 ENCOUNTER — Ambulatory Visit: Attending: Orthopaedic Surgery

## 2024-04-27 ENCOUNTER — Encounter: Payer: Self-pay | Admitting: Physical Therapy

## 2024-04-27 DIAGNOSIS — M25572 Pain in left ankle and joints of left foot: Secondary | ICD-10-CM | POA: Diagnosis not present

## 2024-04-27 NOTE — Therapy (Signed)
 OUTPATIENT PHYSICAL THERAPY LOWER EXTREMITY TREATMENT   Patient Name: [name] MRN: [id number] DOB:11/23/1961, 63 y.o., female Today's Date: 04/27/2024  END OF SESSION:  PT End of Session - 04/27/24 1122     Visit Number 15    Number of Visits 20    Date for PT Re-Evaluation 05/03/24    Authorization Type [employer] [id number]    Authorization - Number of Visits 20    Progress Note Due on Visit 20    PT Start Time 1120    PT Stop Time 1200    PT Time Calculation (min) 40 min    Activity Tolerance Patient tolerated treatment well    Behavior During Therapy WFL for tasks assessed/performed                    Past Medical History:  Diagnosis Date   Complication of anesthesia    PONV (postoperative nausea and vomiting)    Past Surgical History:  Procedure Laterality Date   AUGMENTATION MAMMAPLASTY Bilateral 2010   BICEPT TENODESIS Right 09/24/2021   Procedure: BICEPS TENODESIS;  Surgeon: Jasmine Mesi, MD;  Location: [facility] OR;  Service: Orthopedics;  Laterality: Right;   CERVICAL SPINE SURGERY  2003   C5-C7- double dissectomy   PAROTID GLAND TUMOR EXCISION     SHOULDER ARTHROSCOPY Right 09/24/2021   Procedure: right shoulder arthroscopy, debridement,open rotator cuff tear repair;  Surgeon: Jasmine Mesi, MD;  Location: [facility] OR;  Service: Orthopedics;  Laterality: Right;   WISDOM TOOTH EXTRACTION Bilateral    Patient Active Problem List   Diagnosis Date Noted   Elevated low density lipoprotein (LDL) cholesterol level 08/01/2023   Complete tear of right rotator cuff    Degenerative superior labral anterior-to-posterior (SLAP) tear of right shoulder    Healthcare maintenance 07/31/2021   Congenital pes cavus 02/25/2021   History of uterine fibroid 01/05/2021   Herpes 07/12/2020   GERD (gastroesophageal reflux disease) 05/23/2020   Vaginal atrophy 05/23/2020   Cervical spine arthritis 05/23/2020   Osteopenia 05/23/2020   Insomnia 05/20/2020   Postmenopausal  hormone therapy 05/20/2020    PCP: Dr. Jolene Cannady   REFERRING PROVIDER: Dr. Lasandra Points   REFERRING DIAG: Left Ankle Fracture   THERAPY DIAG:  Pain in left ankle and joints of left foot  Rationale for Evaluation and Treatment: Rehabilitation  ONSET DATE: 01/05/24 (Surgery Date)   SUBJECTIVE:   SUBJECTIVE STATEMENT: Pt reports 5/10 NPS. Using RW still. Practicing Single crutch some inside home. Has not used outside of home as she reports being nervous. Has not had the chance to ambulate in a pool yet.  PERTINENT HISTORY: Pt reports sustaining a left ankle injury on January 11th after sledding into a tree. She sustained a pylon fracture of left ankle and she has since been non-weight bearing in post operative boot. He wounds have healed since and she is experiencing swelling in left foot but this has also reduced since initial surgery. She is to remain non-weight bearing on left ankle until March 12th.  PAIN:  Are you having pain? Yes: NPRS scale: 5/10  Pain location: Dorsal side of right ankle   Pain description: Achy  Aggravating factors: When moving ankle  Relieving factors: Keeping ankle still   PRECAUTIONS: None  RED FLAGS: None   WEIGHT BEARING RESTRICTIONS: Yes Non-weight bearing and needs to use post surgical boot and scooter until March 12th   FALLS:  Has patient fallen in last 6 months? No  LIVING ENVIRONMENT: Lives  with: lives with their spouse Lives in: House/apartment Stairs:  Did not ask  Has following equipment at home: shower chair and Scooter   OCCUPATION: Nurse; works at an outpatient clinic   PLOF: Independent  PATIENT GOALS: To return to walking like she did before the accident.   NEXT MD VISIT: Not sure, end of March   OBJECTIVE:  Note: Objective measures were completed at Evaluation unless otherwise noted.  VITALS:  BP 108/52  HR 93 SpO2 99   DIAGNOSTIC FINDINGS:   CLINICAL DATA:  Ankle trauma, fracture, xray done (Age >= 5y)    EXAM: CT OF THE LEFT ANKLE WITHOUT CONTRAST   TECHNIQUE: Multidetector CT imaging of the left ankle was performed according to the standard protocol. Multiplanar CT image reconstructions were also generated.   RADIATION DOSE REDUCTION: This exam was performed according to the departmental dose-optimization program which includes automated exposure control, adjustment of the mA and/or kV according to patient size and/or use of iterative reconstruction technique.   COMPARISON:  None Available.   FINDINGS: Bones/Joint/Cartilage   Acute, complex trimalleolar fracture of the left ankle. Vertically oriented fracture through the base of the medial malleolus with mild medial displacement resulting in approximately 4 mm of articular-surface diastasis. Comminuted intra-articular fracture of the tibial plafond and with vertical extension into the distal metaphysis. Approximately 5 mm of articular-surface depression and diastasis. Transversely oriented fracture through the distal fibular metaphysis without significant displacement. Medial subluxation of the talar dome relative to the distal tibia without dislocation.   Bones of the hindfoot and midfoot are intact without fracture or malalignment. Joint spaces are preserved.   Ligaments   Suboptimally assessed by CT.   Muscles and Tendons   No acute musculotendinous abnormality by CT although position of the tibialis posterior tendon and flexor digitorum longus tendons could be at risk for entrapment.   Soft tissues   Soft tissue swelling with ill-defined hematoma about the ankle, more pronounced medially. No soft tissue air to suggest open fracture.   IMPRESSION: 1. Acute, complex trimalleolar fracture of the left ankle, as described above. 2. Medial subluxation of the talar dome relative to the distal tibia without dislocation. 3. Soft tissue swelling with ill-defined hematoma about the ankle, more pronounced medially.      Electronically Signed   By: Leverne Reading D.O.   On: 01/02/2024 14:54    PATIENT SURVEYS:  LEFS NT   COGNITION: Overall cognitive status: Within functional limits for tasks assessed     SENSATION: Light touch: Impaired   EDEMA:  Figure 8: NT    POSTURE: No Significant postural limitations  PALPATION: Dorsal side of right DIPS TTP   LOWER EXTREMITY ROM:  A/PROM Right eval Left eval Left  03/15/24  Hip flexion     Hip extension     Hip abduction     Hip adduction     Hip internal rotation     Hip external rotation     Knee flexion     Knee extension     Ankle dorsiflexion 10/12 5/7 10  Ankle plantarflexion 50 8/9 40  Ankle inversion 35 10/12 20  Ankle eversion 15 5/5 20   (Blank rows = not tested)    LOWER EXTREMITY MMT:  MMT Right eval Left eval Left  03/15/24  Hip flexion     Hip extension     Hip abduction     Hip adduction     Hip internal rotation     Hip external  rotation     Knee flexion     Knee extension     Ankle dorsiflexion (Long Sitting) 4+ 3+ 4  Ankle plantarflexion (Long Sitting) 4+ 4- 4  Ankle inversion 4+ 3+ 4-  Ankle eversion 4+ 3+ 3+   (Blank rows = not tested)   FUNCTIONAL TESTS:  None performed   GAIT:  N/a; non-weight bearing on left foot                                                                                                                                 TREATMENT DATE:   04/27/24:   Gait Training: 30 minutes total at supervision to intermittent CGA with curbs.   Single crutch in RUE.    4x10 meters. Good use of crutch with 2 point sep through and reciprocal pattern. Notable R lateral lean to offload L foot in stance phase. Elevated crutch with improved posture but still with mild R lean.     Gait outdoors (10 minutes total): asc/desc inclines working on L ankle DF and foot clearance, 4 laps. Asc/desc curb outdoors. Reviewed correct single crutch positioning and Le sequencing with good carryover  completing 6 reps asc/desc.    Steps. PT demo prior with single rail and crutch use pt completing correctly 3 laps (4 steps) asc/desc.    Neuro Re-Ed:    Obstacle course with crutch in RUE, CGA     Ambulation over red/yellow mat with ankle weights underneath. 6 laps for unstable surface navigation.     Airex pad step up and over --> Ambulation over red/yellow mat with ankle weights underneath. 6 laps for unstable surface navigation --> single limb on airex beam and limb on floor. 4 laps      Side stepping along airex beam: 6 to L and 6 to R, SBA and light BUE support on TM bar.     Alternating mini lunges on BOSU ball with SUE support: 2x8/LE SBA    PATIENT EDUCATION:  Education details: Form and technique for correct performance of exercise. Person educated: Patient Education method: Explanation, Demonstration, Verbal cues, and Handouts Education comprehension: verbalized understanding and returned demonstration  HOME EXERCISE PROGRAM: Access Code: [email or web address] URL: [email or web address] Date: 04/20/2024 Prepared by: Marge Shed  Exercises - Heel-Toe Walking  - 1 x daily - 7 x weekly - 10 reps - Standing Dorsiflexion Self-Mobilization on Step  - 1 x daily - 7 x weekly - 2 sets - 10 reps - 3 sec hold - Standing Bilateral Gastroc Stretch with Step  - 1 x daily - 7 x weekly - 1 sets - 3 reps - 30-60 sec hold - Heel Toe Raises with Counter Support  - 3-4 x weekly - 3 sets - 10 reps -Stationary Rocker     ASSESSMENT:  CLINICAL IMPRESSION: Focus of session today on single crutch gait progression. Working primarily on equal LE weight bearing and outdoor obstacle navigation and unstable surfaces. Pt  with excellent tolerance and understanding for safe LE sequencing and crutch use to prevent falls risk and stability with unstable surfaces without LOB. Encouraged to utilize single crutch as comfortable in household and short community tasks to progress LRAD and  comfortability. She will continue to benefit from skilled PT to address these aforementioned deficits to return to walking and weight bearing on left ankle to return to job and performing self care and household tasks.  OBJECTIVE IMPAIRMENTS: Abnormal gait, difficulty walking, decreased ROM, decreased strength, hypomobility, increased edema, impaired sensation, and pain.   ACTIVITY LIMITATIONS: carrying, lifting, bending, standing, squatting, stairs, bathing, toileting, dressing, hygiene/grooming, locomotion level, and caring for others  PARTICIPATION LIMITATIONS: cleaning, laundry, driving, shopping, community activity, and occupation  PERSONAL FACTORS: Age, Education, Fitness, Profession, and Time since onset of injury/illness/exacerbation are also affecting patient's functional outcome.   REHAB POTENTIAL: Good  CLINICAL DECISION MAKING: Stable/uncomplicated  EVALUATION COMPLEXITY: Low   GOALS: Goals reviewed with patient? No  SHORT TERM GOALS: Target date: 03/08/2024  Patient will demonstrate undestanding of home exercise plan by performing exercises correctly with evidence of good carry over with min to no verbal or tactile cues when demonstrating exercises. Baseline: NT 03/07/24: Performing exercises independently  Goal status: ACHIEVED    2.  Patient will be able to perform partial weight bearing exercises on LLE as evidence of ongoing healing and improved function in left ankle.  Baseline: Unable to weight bear on left ankle until March 12th  04/05/24: Able to perform to use partial weight bearing.   Goal status: ACHIEVED    LONG TERM GOALS: Target date: 05/03/2024  Patient will improve her LEFS score by >=9 pt to demonstrate a statistically significant improvement in her right ankle function to be able to walk longer distances to collect items for her job. Isom Marin et al 1610) Baseline: 11/80 03/16/24: 16.3 % 04/05/24: 26/80 (33%)            Goal status: ONGOING    2.   Patient will demonstrate improved left ankle AROM with range of motion within 20% of right ankle to improve ankle mechanics to ambulate without limitations.  Baseline: Ankle DF R/L 10/5, Ankle PF 50/8, Ankle Inv R/L 35/10, Ankle Ev R/L 15/5  03/15/24: DF R 10,PF R 40, Inv R 20, Ev R 20 04/05/24: DF R 15, PF R 40, Inv R 30, Ev R 20  Goal status: ONGOING   3.  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  Patient will improve left ankle strength to be near symmetrical to right ankle for improved left ankle function to be able to normalize gait and return to weight bearing activities for her job.  Baseline: Ankle DF R/L 4+/3+, Ankle PF R/L 4+/4-, Ankle Ever R/L 4+/4- , Ankle Inv R/L 4+/4- 03/15/24: DF R 4, PF R 4, Inv R 4-, Ev R 3+ 04/05/24: DF R 4+, PF R 4+, Inv R 4, Ev R 4- Goal status: ONGOING      PLAN:  PT FREQUENCY: 1-2x/week  PT DURATION: 10 weeks  PLANNED INTERVENTIONS: 97164- PT Re-evaluation, 97110-Therapeutic exercises, 97530- Therapeutic activity, [id number]- Neuromuscular re-education, 97535- Self Care, 96045- Manual therapy, [id number]- Gait training, [phone]- Orthotic Fit/training,  16109- Aquatic Therapy, G0283- Electrical stimulation (unattended), [phone]- Electrical stimulation (manual), 09811- Wound care (first 20 sq cm), Patient/Family education, Balance training, Stair training, Dry Needling, Joint mobilization, Joint manipulation, Spinal manipulation, Spinal mobilization, Scar mobilization, DME instructions, Cryotherapy, and Moist heat  PLAN FOR NEXT SESSION: Walk with use of trekking pole or walking stick. Continue with left ankle strengthening with static balance with tandem or single leg stance  exercises. Continue to progress heel raises and toe raises. Aquatic therapy: heel and toe walking, balance on LLE, and side shuffles.   Marc Senior. Fairly IV, PT, DPT Physical Therapist- [facility]

## 2024-05-04 ENCOUNTER — Ambulatory Visit: Admitting: Physical Therapy

## 2024-05-09 ENCOUNTER — Ambulatory Visit: Payer: BC Managed Care – PPO | Admitting: Dermatology

## 2024-05-09 DIAGNOSIS — L821 Other seborrheic keratosis: Secondary | ICD-10-CM

## 2024-05-09 DIAGNOSIS — W908XXD Exposure to other nonionizing radiation, subsequent encounter: Secondary | ICD-10-CM

## 2024-05-09 DIAGNOSIS — L720 Epidermal cyst: Secondary | ICD-10-CM

## 2024-05-09 DIAGNOSIS — Z85828 Personal history of other malignant neoplasm of skin: Secondary | ICD-10-CM

## 2024-05-09 DIAGNOSIS — L814 Other melanin hyperpigmentation: Secondary | ICD-10-CM

## 2024-05-09 DIAGNOSIS — D1801 Hemangioma of skin and subcutaneous tissue: Secondary | ICD-10-CM

## 2024-05-09 DIAGNOSIS — Z808 Family history of malignant neoplasm of other organs or systems: Secondary | ICD-10-CM

## 2024-05-09 DIAGNOSIS — Z1283 Encounter for screening for malignant neoplasm of skin: Secondary | ICD-10-CM | POA: Diagnosis not present

## 2024-05-09 DIAGNOSIS — X32XXXD Exposure to sunlight, subsequent encounter: Secondary | ICD-10-CM

## 2024-05-09 DIAGNOSIS — L578 Other skin changes due to chronic exposure to nonionizing radiation: Secondary | ICD-10-CM | POA: Diagnosis not present

## 2024-05-09 DIAGNOSIS — D229 Melanocytic nevi, unspecified: Secondary | ICD-10-CM

## 2024-05-09 DIAGNOSIS — I781 Nevus, non-neoplastic: Secondary | ICD-10-CM

## 2024-05-09 DIAGNOSIS — D239 Other benign neoplasm of skin, unspecified: Secondary | ICD-10-CM

## 2024-05-09 NOTE — Progress Notes (Signed)
 Follow-Up Visit   Subjective  [name] is a 63 y.o. female who presents for the following: Skin Cancer Screening and Full Body Skin Exam white bump at right cheek area  Spot at left shin  Hx of aks, hx of scc, family history of melanoma  The patient presents for Total-Body Skin Exam (TBSE) for skin cancer screening and mole check. The patient has spots, moles and lesions to be evaluated, some may be new or changing and the patient may have concern these could be cancer.    The following portions of the chart were reviewed this encounter and updated as appropriate: medications, allergies, medical history  Review of Systems:  No other skin or systemic complaints except as noted in HPI or Assessment and Plan.  Objective  Well appearing patient in no apparent distress; mood and affect are within normal limits.  A full examination was performed including scalp, head, eyes, ears, nose, lips, neck, chest, axillae, abdomen, back, buttocks, bilateral upper extremities, bilateral lower extremities, hands, feet, fingers, toes, fingernails, and toenails. All findings within normal limits unless otherwise noted below.   Relevant physical exam findings are noted in the Assessment and Plan.    Assessment & Plan   SKIN CANCER SCREENING PERFORMED TODAY.  ACTINIC DAMAGE WITH PRECANCEROUS ACTINIC KERATOSES Counseling for Topical Chemotherapy Management: Patient exhibits: - Severe, confluent actinic changes with pre-cancerous actinic keratoses that is secondary to cumulative UV radiation exposure over time - Condition that is severe; chronic, not at goal. - diffuse scaly erythematous macules and papules with underlying dyspigmentation - Discussed Prescription "Field Treatment" topical Chemotherapy for Severe, Chronic Confluent Actinic Changes with Pre-Cancerous Actinic Keratoses Field treatment involves treatment of an entire area of skin that has confluent Actinic Changes (Sun/ Ultraviolet  light damage) and PreCancerous Actinic Keratoses by method of PhotoDynamic Therapy (PDT) and/or prescription Topical Chemotherapy agents such as 5-fluorouracil , 5-fluorouracil /calcipotriene, and/or imiquimod.  The purpose is to decrease the number of clinically evident and subclinical PreCancerous lesions to prevent progression to development of skin cancer by chemically destroying early precancer changes that may or may not be visible.  It has been shown to reduce the risk of developing skin cancer in the treated area. As a result of treatment, redness, scaling, crusting, and open sores may occur during treatment course. One or more than one of these methods may be used and may have to be used several times to control, suppress and eliminate the PreCancerous changes. Discussed treatment course, expected reaction, and possible side effects. - Recommend daily broad spectrum sunscreen SPF 30+ to sun-exposed areas, reapply every 2 hours as needed.  - Staying in the shade or wearing long sleeves, sun glasses (UVA+UVB protection) and wide brim hats (4-inch brim around the entire circumference of the hat) are also recommended. - Call for new or changing lesions.  Discussed 5 -fluorouracil /calcipotriene cream to treat actinic damage at right cheek and face. Patient deferred treatment today would like to continue to observe for changes.   Has used 5FU/VitD cream in past to chest  Patient deferred at this time would like to continue to observe  Reviewed course of treatment and expected reaction.  Patient advised to expect inflammation and crusting and advised that erosions are possible.  Patient advised to be diligent with sun protection during and after treatment. Counseled to keep medication out of reach of children and pets.   MILIA Exam: tiny erythematous firm white papule right cheek  Treatment Plan: Discussed this is a type of cyst.  Benign-appearing. Sometimes these will clear with OTC  adapalene/Differin 0.1% cream QHS or retinol.  Discussed extraction if symptomatic.    LENTIGINES, SEBORRHEIC KERATOSES, HEMANGIOMAS - Benign normal skin lesions - Sk at left forearm, left pretibia  - Benign-appearing - Call for any changes   MELANOCYTIC NEVI - 3 mm medium brown macule left lower back - 4 mm medium light brown macule at left lower back - 4 mm brown macule at left buttock  - Tan-brown and/or pink-flesh-colored symmetric macules and papules - Benign appearing on exam today - Observation - Call clinic for new or changing moles - Recommend daily use of broad spectrum spf 30+ sunscreen to sun-exposed areas.    DERMATOFIBROMA Exam: Firm pink/brown papulenodule with dimple sign. At right upper arm and left post ankle  Treatment Plan: A dermatofibroma is a benign growth possibly related to trauma, such as an insect bite, cut from shaving, or inflamed acne-type bump.  Treatment options to remove include shave or excision with resulting scar and risk of recurrence.  Since benign-appearing and not bothersome, will observe for now.   Varicose Veins/Spider Veins - Dilated blue, purple or red veins at the lower extremities - Reassured - Smaller vessels can be treated by sclerotherapy (a procedure to inject a medicine into the veins to make them disappear) if desired, but the treatment is not covered by insurance. Larger vessels may be covered if symptomatic and we would refer to vascular surgeon if treatment desired.   HISTORY OF SQUAMOUS CELL CARCINOMA OF THE SKIN Right lower lip  Patient reports had 5 years ago - treated with Blue light and chemo cream in [place]   - No evidence of recurrence today - Recommend regular full body skin exams - Recommend daily broad spectrum sunscreen SPF 30+ to sun-exposed areas, reapply every 2 hours as needed.  - Call if any new or changing lesions are noted between office visits  FAMILY HISTORY OF SKIN CANCER What type(s):melanoma,  bcc  Who affected:sister treated years ago    Return in about 1 year (around 05/09/2025) for TBSE.  I, Randee Busing, CMA, am acting as scribe for Artemio Larry, MD.   Documentation: I have reviewed the above documentation for accuracy and completeness, and I agree with the above.  Artemio Larry, MD

## 2024-05-09 NOTE — Patient Instructions (Signed)

## 2024-05-10 ENCOUNTER — Ambulatory Visit

## 2024-05-10 DIAGNOSIS — M25572 Pain in left ankle and joints of left foot: Secondary | ICD-10-CM

## 2024-05-10 NOTE — Therapy (Signed)
 OUTPATIENT PHYSICAL THERAPY TREATMENT   Patient Name: [name] MRN: [id number] DOB:23-Jul-1961, 63 y.o., female Today's Date: 05/10/2024  END OF SESSION:  PT End of Session - 05/10/24 0823     Visit Number 16    Number of Visits 20    Date for PT Re-Evaluation 05/03/24    Authorization Type [employer] [id number]    Progress Note Due on Visit 20    PT Start Time 0818    PT Stop Time 0858    PT Time Calculation (min) 40 min    Activity Tolerance Patient tolerated treatment well;No increased pain    Behavior During Therapy WFL for tasks assessed/performed                    Past Medical History:  Diagnosis Date   Complication of anesthesia    PONV (postoperative nausea and vomiting)    Past Surgical History:  Procedure Laterality Date   AUGMENTATION MAMMAPLASTY Bilateral 2010   BICEPT TENODESIS Right 09/24/2021   Procedure: BICEPS TENODESIS;  Surgeon: Jasmine Mesi, MD;  Location: [facility] OR;  Service: Orthopedics;  Laterality: Right;   CERVICAL SPINE SURGERY  2003   C5-C7- double dissectomy   PAROTID GLAND TUMOR EXCISION     SHOULDER ARTHROSCOPY Right 09/24/2021   Procedure: right shoulder arthroscopy, debridement,open rotator cuff tear repair;  Surgeon: Jasmine Mesi, MD;  Location: [facility] OR;  Service: Orthopedics;  Laterality: Right;   WISDOM TOOTH EXTRACTION Bilateral    Patient Active Problem List   Diagnosis Date Noted   Elevated low density lipoprotein (LDL) cholesterol level 08/01/2023   Complete tear of right rotator cuff    Degenerative superior labral anterior-to-posterior (SLAP) tear of right shoulder    Healthcare maintenance 07/31/2021   Congenital pes cavus 02/25/2021   History of uterine fibroid 01/05/2021   Herpes 07/12/2020   GERD (gastroesophageal reflux disease) 05/23/2020   Vaginal atrophy 05/23/2020   Cervical spine arthritis 05/23/2020   Osteopenia 05/23/2020   Insomnia 05/20/2020   Postmenopausal hormone therapy 05/20/2020    PCP: Dr.  Jolene Cannady   REFERRING PROVIDER: Dr. Lasandra Points   REFERRING DIAG: Left Ankle Fracture   THERAPY DIAG:  Pain in left ankle and joints of left foot  Rationale for Evaluation and Treatment: Rehabilitation  ONSET DATE: 01/05/24 (Surgery Date)   SUBJECTIVE:   SUBJECTIVE STATEMENT: Single crutch proficiceny conitnued to improve, but still needs a scooter to achieve all mobility tasks. Pain daily still.   PERTINENT HISTORY: Pt reports sustaining a left ankle injury on January 11th after sledding into a tree. She sustained a pylon fracture of left ankle and she has since been non-weight bearing in post operative boot. He wounds have healed since and she is experiencing swelling in left foot but this has also reduced since initial surgery. She is to remain non-weight bearing on left ankle until March 12th.  PAIN:  Are you having pain? Yes, lateral malleolus, 4/10  PRECAUTIONS: None  RED FLAGS: None   WEIGHT BEARING RESTRICTIONS: Full weightbearing  FALLS:  Has patient fallen in last 6 months? 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   NEXT MD VISIT: June  OBJECTIVE:  Note: Objective measures were completed  at Evaluation unless otherwise noted.  DIAGNOSTIC FINDINGS:  CLINICAL DATA:  Ankle trauma, fracture, xray done (Age >= 5y)   EXAM: CT OF THE LEFT ANKLE WITHOUT CONTRAST   TECHNIQUE: Multidetector CT imaging of the left ankle was performed according to the standard protocol. Multiplanar CT image reconstructions were also generated.   RADIATION DOSE REDUCTION: This exam was performed according to the departmental dose-optimization program which includes automated exposure control,  adjustment of the mA and/or kV according to patient size and/or use of iterative reconstruction technique.   COMPARISON:  None Available.   FINDINGS: Bones/Joint/Cartilage   Acute, complex trimalleolar fracture of the left ankle. Vertically oriented fracture through the base of the medial malleolus with mild medial displacement resulting in approximately 4 mm of articular-surface diastasis. Comminuted intra-articular fracture of the tibial plafond and with vertical extension into the distal metaphysis. Approximately 5 mm of articular-surface depression and diastasis. Transversely oriented fracture through the distal fibular metaphysis without significant displacement. Medial subluxation of the talar dome relative to the distal tibia without dislocation.   Bones of the hindfoot and midfoot are intact without fracture or malalignment. Joint spaces are preserved.   Ligaments   Suboptimally assessed by CT.   Muscles and Tendons   No acute musculotendinous abnormality by CT although position of the tibialis posterior tendon and flexor digitorum longus tendons could be at risk for entrapment.   Soft tissues   Soft tissue swelling with ill-defined hematoma about the ankle, more pronounced medially. No soft tissue air to suggest open fracture.   IMPRESSION: 1. Acute, complex trimalleolar fracture of the left ankle, as described above. 2. Medial subluxation of the talar dome relative to the distal tibia without dislocation. 3. Soft tissue swelling with ill-defined hematoma about the ankle, more pronounced medially.    Electronically Signed   By: Leverne Reading D.O.   On: 01/02/2024 14:54   SENSATION: Light touch: Impaired   POSTURE: No Significant postural limitations  LOWER EXTREMITY ROM:  A/PROM Right eval Left eval Left  03/15/24  Ankle dorsiflexion 10/12 5/7 10  Ankle plantarflexion 50 8/9 40  Ankle inversion 35 10/12 20  Ankle eversion 15 5/5 20   (Blank  rows = not tested)   LOWER EXTREMITY MMT:  MMT Right eval Left eval Left  03/15/24  Ankle dorsiflexion (Long Sitting) 4+ 3+ 4  Ankle plantarflexion (Long Sitting) 4+ 4- 4  Ankle inversion 4+ 3+ 4-  Ankle eversion 4+ 3+ 3+   (Blank rows = not tested)                                                                                                                              TREATMENT DATE 05/10/24 - warmup on Nustep x5 minutes level 3  - seated ankle DF stretch 3x30sec seated  - Ankle ROM: 5 degrees active DF, 10 degrees passive DF; 45 degrees active PF  - LAD ankle pull 3x30sec (towel wrap for cushion);  all toe extension P/ROM stretch 3x30sec, MFR to foot arch x2 minutes  - seated Left ankle DF 2lb AW 2x15 -seated closed chain LLE PF 2x20 (full range, no resistance)    PATIENT EDUCATION:  Education details: added toe stretching to HEP 5/20 Person educated: Patient Education method: Research scientist (medical), deliberate practice, positive reinforcement, explicit instruction, establish rules. Education comprehension: verbalized understanding and returned demonstration  HOME EXERCISE PROGRAM: Access Code: [email or web address] URL: [email or web address] Date: 04/20/2024 Prepared by: Marge Shed  Exercises - Heel-Toe Walking  - 1 x daily - 7 x weekly - 10 reps - Standing Dorsiflexion Self-Mobilization on Step  - 1 x daily - 7 x weekly - 2 sets - 10 reps - 3 sec hold - Standing Bilateral Gastroc Stretch with Step  - 1 x daily - 7 x weekly - 1 sets - 3 reps - 30-60 sec hold - Heel Toe Raises with Counter Support  - 3-4 x weekly - 3 sets - 10 reps -Stationary   5/20: (added all toe extension stretch at home P/ROM )   ASSESSMENT:  CLINICAL IMPRESSION: Pt conitnues to have dominant antalgia, however she is finding good adaptive strategies to achieve IADL goals thus far. ROM and strength remain limited with limited pain at end of all directions. She will continue to benefit from  skilled PT to address these aforementioned deficits to return to walking and weight bearing on left ankle to return to job and performing self care and household tasks.  OBJECTIVE IMPAIRMENTS: Abnormal gait, difficulty walking, decreased ROM, decreased strength, hypomobility, increased edema, impaired sensation, and pain.   ACTIVITY LIMITATIONS: carrying, lifting, bending, standing, squatting, stairs, bathing, toileting, dressing, hygiene/grooming, locomotion level, and caring for others  PARTICIPATION LIMITATIONS: cleaning, laundry, driving, shopping, community activity, and occupation  PERSONAL FACTORS: Age, Education, Fitness, Profession, and Time since onset of injury/illness/exacerbation are also affecting patient's functional outcome.   REHAB POTENTIAL: Good  CLINICAL DECISION MAKING: Stable/uncomplicated  EVALUATION COMPLEXITY: Low   GOALS: Goals reviewed with patient? No  SHORT TERM GOALS: Target date: 03/08/2024  Patient will demonstrate undestanding of home exercise plan by performing exercises correctly with evidence of good carry over with min to no verbal or tactile cues when demonstrating exercises. Baseline: NT 03/07/24: Performing exercises independently  Goal status: ACHIEVED    2.  Patient will be able to perform partial weight bearing exercises on LLE as evidence of ongoing healing and improved function in left ankle.  Baseline: Unable to weight bear on left ankle until March 12th  04/05/24: Able to perform to use partial weight bearing.   Goal status: ACHIEVED    LONG TERM GOALS: Target date: 05/03/2024  Patient will improve her LEFS score by >=9 pt to demonstrate a statistically significant improvement in her right ankle function to be able to walk longer distances to collect items for her job. Isom Marin et al [id number]) Baseline: 11/80 03/16/24: 16.3 % 04/05/24: 26/80 (33%)            Goal status: ONGOING   2.  Patient will demonstrate improved left ankle AROM with  range of motion within 20% of right ankle to improve ankle mechanics to ambulate without limitations.  Baseline: Ankle DF R/L 10/5, Ankle PF 50/8, Ankle Inv R/L 35/10, Ankle Ev R/L 15/5  03/15/24: DF R 10,PF R 40, Inv R 20, Ev R 20 04/05/24: DF R 15, PF R 40, Inv R 30, Ev R 20  Goal status: ONGOING   3.  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  Patient will improve left ankle strength to be near symmetrical to right ankle for improved left ankle function to be able to normalize gait and return to weight bearing activities for her job.  Baseline: Ankle DF R/L 4+/3+, Ankle PF R/L 4+/4-, Ankle Ever R/L 4+/4- , Ankle Inv R/L 4+/4- 03/15/24: DF R 4, PF R 4, Inv R 4-, Ev R 3+ 04/05/24: DF R 4+, PF R 4+, Inv R 4, Ev R 4- Goal status: ONGOING    PLAN:  PT FREQUENCY: 1-2x/week  PT DURATION: 10 weeks  PLANNED INTERVENTIONS: 40981- PT Re-evaluation, 97110-Therapeutic exercises, 97530- Therapeutic activity, 97112- Neuromuscular re-education, 97535- Self Care, 19147- Manual therapy, [phone]- Gait training, [phone]- Orthotic Fit/training, [phone]- Aquatic Therapy, [phone]- Electrical stimulation (unattended), [phone]- Electrical stimulation (manual), 97597- Wound care (first 20 sq cm), Patient/Family education, Balance training, Stair training, Dry Needling, Joint mobilization, Joint manipulation, Spinal manipulation, Spinal mobilization, Scar mobilization, DME instructions, Cryotherapy, and Moist heat  PLAN FOR NEXT SESSION: Walk with use of trekking pole or walking stick. Continue with left ankle strengthening with static balance with tandem or single leg stance exercises. Continue to progress heel raises and toe raises.  Aquatic therapy: heel and toe walking, balance on LLE, and side shuffles.   8:28 AM, 05/10/24 Dawn Eth, PT, DPT Physical Therapist - [facility]  [phone] [facility])

## 2024-05-18 ENCOUNTER — Ambulatory Visit: Admitting: Physical Therapy

## 2024-05-18 DIAGNOSIS — M25572 Pain in left ankle and joints of left foot: Secondary | ICD-10-CM

## 2024-05-18 NOTE — Addendum Note (Signed)
 Addended by: Benancio Bracket on: 05/18/2024 12:11 PM   Modules accepted: Orders

## 2024-05-18 NOTE — Therapy (Addendum)
 OUTPATIENT PHYSICAL THERAPY TREATMENT   Patient Name: [name] MRN: [id number] DOB:1961/12/15, 63 y.o., female Today's Date: 05/18/2024  END OF SESSION:  PT End of Session - 05/18/24 1000     Visit Number 17    Number of Visits 20    Date for PT Re-Evaluation 08/10/24    Authorization Type [employer] [id number]    Authorization - Number of Visits 17    Progress Note Due on Visit 20    PT Start Time 0950    PT Stop Time 1030    PT Time Calculation (min) 40 min    Activity Tolerance Patient tolerated treatment well;No increased pain    Behavior During Therapy WFL for tasks assessed/performed                     Past Medical History:  Diagnosis Date   Complication of anesthesia    PONV (postoperative nausea and vomiting)    Past Surgical History:  Procedure Laterality Date   AUGMENTATION MAMMAPLASTY Bilateral 2010   BICEPT TENODESIS Right 09/24/2021   Procedure: BICEPS TENODESIS;  Surgeon: Jasmine Mesi, MD;  Location: [facility] OR;  Service: Orthopedics;  Laterality: Right;   CERVICAL SPINE SURGERY  2003   C5-C7- double dissectomy   PAROTID GLAND TUMOR EXCISION     SHOULDER ARTHROSCOPY Right 09/24/2021   Procedure: right shoulder arthroscopy, debridement,open rotator cuff tear repair;  Surgeon: Jasmine Mesi, MD;  Location: [facility] OR;  Service: Orthopedics;  Laterality: Right;   WISDOM TOOTH EXTRACTION Bilateral    Patient Active Problem List   Diagnosis Date Noted   Elevated low density lipoprotein (LDL) cholesterol level 08/01/2023   Complete tear of right rotator cuff    Degenerative superior labral anterior-to-posterior (SLAP) tear of right shoulder    Healthcare maintenance 07/31/2021   Congenital pes cavus 02/25/2021   History of uterine fibroid 01/05/2021   Herpes 07/12/2020   GERD (gastroesophageal reflux disease) 05/23/2020   Vaginal atrophy 05/23/2020   Cervical spine arthritis 05/23/2020   Osteopenia 05/23/2020   Insomnia 05/20/2020   Postmenopausal  hormone therapy 05/20/2020    PCP: Dr. Jolene Cannady   REFERRING PROVIDER: Dr. Lasandra Points   REFERRING DIAG: Left Ankle Fracture   THERAPY DIAG:  Pain in left ankle and joints of left foot - Plan: PT plan of care cert/re-cert  Rationale for Evaluation and Treatment: Rehabilitation  ONSET DATE: 01/05/24 (Surgery Date)   SUBJECTIVE:   SUBJECTIVE STATEMENT: Pt reports ongoing pain in   PERTINENT HISTORY: Pt reports sustaining a left ankle injury on January 11th after sledding into a tree. She sustained a pylon fracture of left ankle and she has since been non-weight bearing in post operative boot. He wounds have healed since and she is experiencing swelling in left foot but this has also reduced since initial surgery. She is to remain non-weight bearing on left ankle until March 12th.  PAIN:  Are you having pain? Yes, left anklE  lateral malleolus, 4/10  PRECAUTIONS: None  RED FLAGS: None   WEIGHT BEARING RESTRICTIONS: Full weightbearing  FALLS:  Has patient fallen in last 6 months? 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   NEXT MD VISIT: June  OBJECTIVE:  Note: Objective measures were completed at Evaluation unless otherwise noted.  DIAGNOSTIC FINDINGS:  CLINICAL DATA:  Ankle trauma, fracture, xray done (Age >= 5y)   EXAM: CT OF THE LEFT ANKLE WITHOUT CONTRAST   TECHNIQUE: Multidetector CT imaging of the left ankle was performed according to the standard protocol. Multiplanar CT image reconstructions were also generated.   RADIATION DOSE REDUCTION: This exam was performed according to the departmental dose-optimization program which includes automated exposure control, adjustment  of the mA and/or kV according to patient size and/or use of iterative reconstruction technique.   COMPARISON:  None Available.   FINDINGS: Bones/Joint/Cartilage   Acute, complex trimalleolar fracture of the left ankle. Vertically oriented fracture through the base of the medial malleolus with mild medial displacement resulting in approximately 4 mm of articular-surface diastasis. Comminuted intra-articular fracture of the tibial plafond and with vertical extension into the distal metaphysis. Approximately 5 mm of articular-surface depression and diastasis. Transversely oriented fracture through the distal fibular metaphysis without significant displacement. Medial subluxation of the talar dome relative to the distal tibia without dislocation.   Bones of the hindfoot and midfoot are intact without fracture or malalignment. Joint spaces are preserved.   Ligaments   Suboptimally assessed by CT.   Muscles and Tendons   No acute musculotendinous abnormality by CT although position of the tibialis posterior tendon and flexor digitorum longus tendons could be at risk for entrapment.   Soft tissues   Soft tissue swelling with ill-defined hematoma about the ankle, more pronounced medially. No soft tissue air to suggest open fracture.   IMPRESSION: 1. Acute, complex trimalleolar fracture of the left ankle, as described above. 2. Medial subluxation of the talar dome relative to the distal tibia without dislocation. 3. Soft tissue swelling with ill-defined hematoma about the ankle, more pronounced medially.    Electronically Signed   By: Leverne Reading D.O.   On: 01/02/2024 14:54   SENSATION: Light touch: Impaired   EDEMA: Figure 8 R/L 19"/20"  POSTURE: No Significant postural limitations  LOWER EXTREMITY ROM:  A/PROM Right eval Left eval Left  03/15/24 Left 05/18/24  Ankle dorsiflexion 10/12 5/7 10 12   Ankle plantarflexion 50 8/9 40   Ankle inversion 35 10/12 20    Ankle eversion 15 5/5 20    (Blank rows = not tested)   Dorsiflexion RLE 18 degrees   LOWER EXTREMITY MMT:  MMT Right eval Left eval Left  03/15/24  Ankle dorsiflexion (Long Sitting) 4+ 3+ 4  Ankle plantarflexion (Long Sitting) 4+ 4- 4  Ankle inversion 4+ 3+ 4-  Ankle eversion 4+ 3+ 3+   (Blank rows = not tested)                                                                                                                              TREATMENT DATE 05/18/24  05/18/24:  THEREX   TM at 1.0 mph for 5 min   Figure 8 R/L 19"/20"  A/PROM Right eval Left eval Left  03/15/24 Right  05/18/24 Left 05/18/24  Ankle dorsiflexion 10/12 5/7 10 18/NT 12/NT   Ankle plantarflexion 50 8/9 40    Ankle inversion 35 10/12 20    Ankle eversion 15 5/5 20     (Blank rows = not tested)  Gait Analysis: Pt not using AD but she did don brace on left ankle Decreased stance time on LLE and step length on RLE likely due to carry over from left sided antalgic gait pattern.   GAIT TRAINING   Single Leg Stance on LLE 5 X 10 sec   -mild sway with use of ankle strategy  Forward Step with LLE as stance leg and intermittent UE support  2 x 10  -min VC to bring LLE as side next to wall to improve weight shift onto LLE       PATIENT EDUCATION:  Education details: Education on gait analysis including antalgic gait leading to decreased stance time on LLE resulting in decreased step length with LLE>RLE  Person educated: Patient Education method: Research scientist (medical), deliberate practice, positive reinforcement, explicit instruction, establish rules. Education comprehension: verbalized understanding and returned demonstration  HOME EXERCISE PROGRAM: Access Code: [email or web address] URL: [email or web address] Date: 05/18/2024 Prepared by: Marge Shed  Exercises - Heel-Toe Walking  - 1 x daily - 7 x weekly - 10 reps - Single Leg Stance  - 1 x daily - 7 x weekly - 5 reps - 10 sec  hold -  Standing Bilateral Gastroc Stretch with Step  - 1 x daily - 7 x weekly - 1 sets - 3 reps - 30-60 sec hold - Heel Toe Raises with Counter Support  - 3-4 x weekly - 3 sets - 10 reps - Static Forward Step with LLE as stance leg closet to wall 3 x 15 x 7 days per week   5/20: (added all toe extension stretch at home P/ROM )   ASSESSMENT:  CLINICAL IMPRESSION: Pt demonstrates improvement in left ankle mobility and deficits in gait pattern from left sided antalgic gait with decreased stance time on LLE and decreased step length on RLE. She also continues to show increased swelling in left ankle likely due to increased weight bearing and ongoing tissue healing.  She does show decreased pain in weight bearing with little to no increase in LLE pain when walking or weight bearing. PT continues to recommend use of AD while walking outside of home especially longer distances.  She will continue to benefit from skilled PT to address these aforementioned deficits to return to walking and weight bearing on left ankle to return to job and performing self care and household tasks.  OBJECTIVE IMPAIRMENTS: Abnormal gait, difficulty walking, decreased ROM, decreased strength, hypomobility, increased edema, impaired sensation, and pain.   ACTIVITY LIMITATIONS: carrying, lifting, bending, standing, squatting, stairs, bathing, toileting, dressing, hygiene/grooming, locomotion level, and caring for others  PARTICIPATION LIMITATIONS: cleaning, laundry, driving, shopping, community activity, and occupation  PERSONAL FACTORS: Age, Education, Fitness, Profession, and Time since onset of injury/illness/exacerbation are also affecting patient's functional outcome.   REHAB POTENTIAL: Good  CLINICAL DECISION MAKING: Stable/uncomplicated  EVALUATION COMPLEXITY: Low   GOALS: Goals reviewed with patient? No  SHORT TERM GOALS: Target date: 03/08/2024  Patient will demonstrate undestanding of home exercise plan by  performing exercises correctly with evidence of good carry over with min to no verbal or tactile cues when demonstrating exercises. Baseline: NT 03/07/24: Performing exercises independently  Goal status: ACHIEVED    2.  Patient will be able to perform partial weight bearing exercises on LLE as evidence of ongoing healing and improved function in left ankle.  Baseline: Unable to weight bear on left ankle until March 12th  04/05/24: Able to perform to use partial weight bearing.   Goal status: ACHIEVED    LONG TERM GOALS: Target date: 08/10/2024  Patient will improve her LEFS score by >=9 pt to demonstrate a statistically significant improvement in her right ankle function to be able to walk longer distances to collect items for her job. Isom Marin et al 1610) Baseline: 11/80 03/16/24: 16.3 % 04/05/24: 26/80 (33%)            Goal status: ONGOING   2.  Patient will demonstrate improved left ankle AROM with range of motion within 20% of right ankle to improve ankle mechanics to ambulate without limitations.  Baseline: Ankle DF R/L 10/5, Ankle PF 50/8, Ankle Inv R/L 35/10, Ankle Ev R/L 15/5  03/15/24: DF R 10,PF R 40, Inv R 20, Ev R 20 04/05/24: DF R 15, PF R 40, Inv R 30, Ev R 20  05/18/24: Ankle DF R/L18/12  Goal status: ONGOING   3.  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  Patient will improve left ankle strength to be near symmetrical to right ankle for improved left ankle function to be able to normalize gait and return to weight bearing activities for her job.  Baseline: Ankle DF R/L 4+/3+, Ankle PF R/L 4+/4-, Ankle Ever R/L 4+/4- , Ankle Inv R/L  4+/4- 03/15/24: DF R 4, PF R 4, Inv R 4-, Ev R 3+ 04/05/24: DF R 4+, PF R 4+, Inv R 4, Ev R 4- Goal status: ONGOING    PLAN:  PT FREQUENCY: 1-2x/week  PT DURATION: 10 weeks  PLANNED INTERVENTIONS: 97164- PT Re-evaluation, 97110-Therapeutic exercises, 97530- Therapeutic activity, 97112- Neuromuscular re-education, 97535- Self Care, 96045- Manual therapy, [id number]- Gait training, [phone]- Orthotic Fit/training, [phone]- Aquatic Therapy, [phone]- Electrical stimulation (unattended), [phone]- Electrical stimulation (manual), 97597- Wound care (first 20 sq cm), Patient/Family education, Balance training, Stair training, Dry Needling, Joint mobilization, Joint manipulation, Spinal manipulation, Spinal mobilization, Scar mobilization, DME instructions, Cryotherapy, and Moist heat  PLAN FOR NEXT SESSION: Reassess LEFS and ankle strength. TM warm up with emphasis on symmetrical step length and without forward flexion or UE support. Focus on gait training to improve increased LLE stance time and increased RLE step length: overground walking with emphasis on symmetrical step length and stance time and weight shift. Static balance: progress single leg balance with increased time 30 sec as opposed to 10 sec.    Marge Shed PT, DPT  [facility] [address] [place], [address] Phone: [phone]   Fax:  [phone]

## 2024-05-18 NOTE — Addendum Note (Signed)
 Addended by: Benancio Bracket on: 05/18/2024 12:09 PM   Modules accepted: Orders

## 2024-05-25 DIAGNOSIS — M25572 Pain in left ankle and joints of left foot: Secondary | ICD-10-CM | POA: Diagnosis not present

## 2024-05-31 ENCOUNTER — Other Ambulatory Visit: Payer: Self-pay | Admitting: Obstetrics and Gynecology

## 2024-05-31 DIAGNOSIS — Z1231 Encounter for screening mammogram for malignant neoplasm of breast: Secondary | ICD-10-CM

## 2024-06-16 ENCOUNTER — Ambulatory Visit
Admission: RE | Admit: 2024-06-16 | Discharge: 2024-06-16 | Disposition: A | Discharge status: Home / Self Care | Source: Ambulatory Visit | Attending: Obstetrics and Gynecology | Admitting: Obstetrics and Gynecology

## 2024-06-16 DIAGNOSIS — Z1231 Encounter for screening mammogram for malignant neoplasm of breast: Secondary | ICD-10-CM | POA: Insufficient documentation

## 2024-07-04 DIAGNOSIS — M25572 Pain in left ankle and joints of left foot: Secondary | ICD-10-CM | POA: Diagnosis not present

## 2024-07-04 DIAGNOSIS — S82872D Displaced pilon fracture of left tibia, subsequent encounter for closed fracture with routine healing: Secondary | ICD-10-CM | POA: Diagnosis not present

## 2024-08-09 ENCOUNTER — Encounter: Payer: Self-pay | Admitting: Nurse Practitioner

## 2024-08-31 ENCOUNTER — Other Ambulatory Visit: Payer: Self-pay | Admitting: Nurse Practitioner

## 2024-09-01 NOTE — Telephone Encounter (Signed)
 Requested medications are due for refill today.  yes  Requested medications are on the active medications list.  yes  Last refill. 08/03/2023 #180 4rf  Future visit scheduled.   yes  Notes to clinic.  Pt is more than 3 months overdue for an ov.    Requested Prescriptions  Pending Prescriptions Disp Refills   traZODone  (DESYREL ) 50 MG tablet [Pharmacy Med Name: traZODone  HCl 50 MG Oral Tablet] 180 tablet 3    Sig: TAKE 2 TABLETS BY MOUTH AT  BEDTIME     Psychiatry: Antidepressants - Serotonin Modulator Failed - 09/01/2024 12:07 PM      Failed - Valid encounter within last 6 months    Recent Outpatient Visits   None     Future Appointments             In 1 week Cannady, Jolene T, NP [facility], [address]

## 2024-09-04 LAB — CYTOLOGY - PAP
CYTOLOGY - PAP: NEGATIVE
HPV Aptima: NEGATIVE

## 2024-09-04 NOTE — Patient Instructions (Signed)
 Be Involved in Caring For Your Health:  Taking Medications When medications are taken as directed, they can greatly improve your health. But if they are not taken as prescribed, they may not work. In some cases, not taking them correctly can be harmful. To help ensure your treatment remains effective and safe, understand your medications and how to take them. Bring your medications to each visit for review by your provider.  Your lab results, notes, and after visit summary will be available on My Chart. We strongly encourage you to use this feature. If lab results are abnormal the clinic will contact you with the appropriate steps. If the clinic does not contact you assume the results are satisfactory. You can always view your results on My Chart. If you have questions regarding your health or results, please contact the clinic during office hours. You can also ask questions on My Chart.  We at [facility] are grateful that you chose us  to provide your care. We strive to provide evidence-based and compassionate care and are always looking for feedback. If you get a survey from the clinic please complete this so we can hear your opinions.  Healthy Eating, Adult Healthy eating may help you get and keep a healthy body weight, reduce the risk of chronic disease, and live a long and productive life. It is important to follow a healthy eating pattern. Your nutritional and calorie needs should be met mainly by different nutrient-rich foods. What are tips for following this plan? Reading food labels Read labels and choose the following: Reduced or low sodium products. Juices with 100% fruit juice. Foods with low saturated fats (<3 g per serving) and high polyunsaturated and monounsaturated fats. Foods with whole grains, such as whole wheat, cracked wheat, brown rice, and wild rice. Whole grains that are fortified with folic acid. This is recommended for females who are pregnant or who want to  become pregnant. Read labels and do not eat or drink the following: Foods or drinks with added sugars. These include foods that contain brown sugar, corn sweetener, corn syrup, dextrose , fructose, glucose, high-fructose corn syrup, honey, invert sugar, lactose, malt syrup, maltose, molasses, raw sugar, sucrose, trehalose, or turbinado sugar. Limit your intake of added sugars to less than 10% of your total daily calories. Do not eat more than the following amounts of added sugar per day: 6 teaspoons (25 g) for females. 9 teaspoons (38 g) for males. Foods that contain processed or refined starches and grains. Refined grain products, such as white flour, degermed cornmeal, white bread, and white rice. Shopping Choose nutrient-rich snacks, such as vegetables, whole fruits, and nuts. Avoid high-calorie and high-sugar snacks, such as potato chips, fruit snacks, and candy. Use oil-based dressings and spreads on foods instead of solid fats such as butter, margarine, sour cream, or cream cheese. Limit pre-made sauces, mixes, and instant products such as flavored rice, instant noodles, and ready-made pasta. Try more plant-protein sources, such as tofu, tempeh, black beans, edamame, lentils, nuts, and seeds. Explore eating plans such as the Mediterranean diet or vegetarian diet. Try heart-healthy dips made with beans and healthy fats like hummus and guacamole. Vegetables go great with these. Cooking Use oil to saut or stir-fry foods instead of solid fats such as butter, margarine, or lard. Try baking, boiling, grilling, or broiling instead of frying. Remove the fatty part of meats before cooking. Steam vegetables in water  or broth. Meal planning  At meals, imagine dividing your plate into fourths: One-half of  your plate is fruits and vegetables. One-fourth of your plate is whole grains. One-fourth of your plate is protein, especially lean meats, poultry, eggs, tofu, beans, or nuts. Include low-fat  dairy as part of your daily diet. Lifestyle Choose healthy options in all settings, including home, work, school, restaurants, or stores. Prepare your food safely: Wash your hands after handling raw meats. Where you prepare food, keep surfaces clean by regularly washing with hot, soapy water . Keep raw meats separate from ready-to-eat foods, such as fruits and vegetables. Cook seafood, meat, poultry, and eggs to the recommended temperature. Get a food thermometer. Store foods at safe temperatures. In general: Keep cold foods at 84F (4.4C) or below. Keep hot foods at 184F (60C) or above. Keep your freezer at [facility] (-17.8C) or below. Foods are not safe to eat if they have been between the temperatures of 40-184F (4.4-60C) for more than 2 hours. What foods should I eat? Fruits Aim to eat 1-2 cups of fresh, canned (in natural juice), or frozen fruits each day. One cup of fruit equals 1 small apple, 1 large banana, 8 large strawberries, 1 cup (237 g) canned fruit,  cup (82 g) dried fruit, or 1 cup (240 mL) 100% juice. Vegetables Aim to eat 2-4 cups of fresh and frozen vegetables each day, including different varieties and colors. One cup of vegetables equals 1 cup (91 g) broccoli or cauliflower florets, 2 medium carrots, 2 cups (150 g) raw, leafy greens, 1 large tomato, 1 large bell pepper, 1 large sweet potato, or 1 medium white potato. Grains Aim to eat 5-10 ounce-equivalents of whole grains each day. Examples of 1 ounce-equivalent of grains include 1 slice of bread, 1 cup (40 g) ready-to-eat cereal, 3 cups (24 g) popcorn, or  cup (93 g) cooked rice. Meats and other proteins Try to eat 5-7 ounce-equivalents of protein each day. Examples of 1 ounce-equivalent of protein include 1 egg,  oz nuts (12 almonds, 24 pistachios, or 7 walnut halves), 1/4 cup (90 g) cooked beans, 6 tablespoons (90 g) hummus or 1 tablespoon (16 g) peanut butter. A cut of meat or fish that is the size of a deck of  cards is about 3-4 ounce-equivalents (85 g). Of the protein you eat each week, try to have at least 8 sounce (227 g) of seafood. This is about 2 servings per week. This includes salmon, trout, herring, sardines, and anchovies. Dairy Aim to eat 3 cup-equivalents of fat-free or low-fat dairy each day. Examples of 1 cup-equivalent of dairy include 1 cup (240 mL) milk, 8 ounces (250 g) yogurt, 1 ounces (44 g) natural cheese, or 1 cup (240 mL) fortified soy milk. Fats and oils Aim for about 5 teaspoons (21 g) of fats and oils per day. Choose monounsaturated fats, such as canola and olive oils, mayonnaise made with olive oil or avocado oil, avocados, peanut butter, and most nuts, or polyunsaturated fats, such as sunflower, corn, and soybean oils, walnuts, pine nuts, sesame seeds, sunflower seeds, and flaxseed. Beverages Aim for 6 eight-ounce glasses of water  per day. Limit coffee to 3-5 eight-ounce cups per day. Limit caffeinated beverages that have added calories, such as soda and energy drinks. If you drink alcohol: Limit how much you have to: 0-1 drink a day if you are female. 0-2 drinks a day if you are female. Know how much alcohol is in your drink. In the [place] one drink is one 12 oz bottle of beer (355 mL), one 5 oz glass of wine (  148 mL), or one 1 oz glass of hard liquor (44 mL). Seasoning and other foods Try not to add too much salt to your food. Try using herbs and spices instead of salt. Try not to add sugar to food. This information is based on [place] nutrition guidelines. To learn more, visit DisposableNylon.be. Exact amounts may vary. You may need different amounts. This information is not intended to replace advice given to you by your health care provider. Make sure you discuss any questions you have with your health care provider. Document Revised: 09/08/2022 Document Reviewed: 09/08/2022 Elsevier Patient Education  2024 [employer].

## 2024-09-08 ENCOUNTER — Encounter: Payer: Self-pay | Admitting: Nurse Practitioner

## 2024-09-08 ENCOUNTER — Ambulatory Visit: Admitting: Nurse Practitioner

## 2024-09-08 VITALS — BP 121/75 | HR 86 | Temp 98.1°F | Resp 15 | Ht 62.99 in | Wt 154.0 lb

## 2024-09-08 DIAGNOSIS — M8588 Other specified disorders of bone density and structure, other site: Secondary | ICD-10-CM | POA: Diagnosis not present

## 2024-09-08 DIAGNOSIS — Z79890 Hormone replacement therapy (postmenopausal): Secondary | ICD-10-CM | POA: Diagnosis not present

## 2024-09-08 DIAGNOSIS — K219 Gastro-esophageal reflux disease without esophagitis: Secondary | ICD-10-CM

## 2024-09-08 DIAGNOSIS — E7800 Pure hypercholesterolemia, unspecified: Secondary | ICD-10-CM

## 2024-09-08 DIAGNOSIS — Z1211 Encounter for screening for malignant neoplasm of colon: Secondary | ICD-10-CM

## 2024-09-08 DIAGNOSIS — Z23 Encounter for immunization: Secondary | ICD-10-CM | POA: Diagnosis not present

## 2024-09-08 DIAGNOSIS — Z0000 Encounter for general adult medical examination without abnormal findings: Secondary | ICD-10-CM

## 2024-09-08 DIAGNOSIS — F5101 Primary insomnia: Secondary | ICD-10-CM

## 2024-09-08 NOTE — Assessment & Plan Note (Signed)
 Chronic, stable.  Continue current medication regimen, Trazodone  75-100 MG as needed.  Continue focus on good sleep hygiene techniques at home.

## 2024-09-08 NOTE — Assessment & Plan Note (Signed)
 Noted on past labs.  Recheck today. Continue diet and exercise focus at home. The 10-year ASCVD risk score (Arnett DK, et al., 2019) is: 3.6%   Values used to calculate the score:     Age: 63 years     Clincally relevant sex: Female     Is Non-Hispanic African American: No     Diabetic: No     Tobacco smoker: No     Systolic Blood Pressure: 121 mmHg     Is BP treated: No     HDL Cholesterol: 65 mg/dL     Total Cholesterol: 193 mg/dL

## 2024-09-08 NOTE — Assessment & Plan Note (Signed)
Continue collaboration with GYN at [facility]. 

## 2024-09-08 NOTE — Assessment & Plan Note (Signed)
Chronic, stable.  Continue daily Nexium and adjust regimen as needed.  Had colonoscopy and EGD in 2020.

## 2024-09-08 NOTE — Assessment & Plan Note (Signed)
Ongoing, noted on DEXA. Continue daily Vitamin D3 and Calcium supplements.  Repeat DEXA in 2 years (04/13/25), family history and hormone treatment.  Vit D level today.

## 2024-09-08 NOTE — Progress Notes (Signed)
 BP 121/75 (BP Location: Left Arm, Patient Position: Sitting, Cuff Size: Normal)   Pulse 86   Temp 98.1 F (36.7 C) (Oral)   Resp 15   Ht 5' 2.99 (1.6 m)   Wt 154 lb (69.9 kg)   SpO2 97%   BMI 27.29 kg/m    Subjective:    Patient ID: [name], female    DOB: 1961-02-11, 63 y.o.   MRN: [id number]  HPI: [name] is a 63 y.o. female presenting on 09/08/2024 for comprehensive medical examination. Current medical complaints include:none  She currently lives with: husband Menopausal Symptoms: no   Follows with GYN for menopausal symptoms, last saw 03/02/24. Continues Testosterone  gel, Progesterone , and Yuvafem . On current regimen she is able to function well. Continues on Macrobid  after intercourse as preventative.   INSOMNIA Takes Trazodone  for sleep.  Duration: chronic Satisfied with sleep quality: yes Difficulty falling asleep: at times  Difficulty staying asleep: no Waking a few hours after sleep onset: no Early morning awakenings: no Daytime hypersomnolence: no Wakes feeling refreshed: no Good sleep hygiene: no Apnea: no Snoring: yes Depressed/anxious mood: no Recent stress: no Restless legs/nocturnal leg cramps: no Chronic pain/arthritis: no History of sleep study: no Treatments attempted: Trazodone  and Ambien    GERD Takes Nexium 20 MG daily, if does not take will get heart burn. GERD control status: stable  Satisfied with current treatment? yes Heartburn frequency: no Medication side effects: no  Medication compliance: stable Dysphagia: no Odynophagia:  no Hematemesis: no Blood in stool: no EGD: yes  OSTEOPENIA Last DEXA 04/14/23. Satisfied with current treatment?: yes Adequate calcium & vitamin D : yes Weight bearing exercises: yes   The 10-year ASCVD risk score (Arnett DK, et al., 2019) is: 3.6%   Values used to calculate the score:     Age: 54 years     Clincally relevant sex: Female     Is Non-Hispanic African American: No     Diabetic:  No     Tobacco smoker: No     Systolic Blood Pressure: 121 mmHg     Is BP treated: No     HDL Cholesterol: 65 mg/dL     Total Cholesterol: 193 mg/dL  Depression Screen done today and results listed below:     09/08/2024    9:54 AM 08/03/2023    9:12 AM 08/01/2022   10:22 AM 07/31/2021    9:11 AM 01/22/2021   10:31 AM  Depression screen PHQ 2/9  Decreased Interest 0 0 0 0 0  Down, Depressed, Hopeless 0 0 0 0 0  PHQ - 2 Score 0 0 0 0 0  Altered sleeping 0 1 1    Tired, decreased energy 0 0 0    Change in appetite 0 0 0    Feeling bad or failure about yourself  0 0 0    Trouble concentrating 0 0 0    Moving slowly or fidgety/restless 0 0 0    Suicidal thoughts 0 0 0    PHQ-9 Score 0 1 1    Difficult doing work/chores Not difficult at all Somewhat difficult Not difficult at all        09/08/2024    9:55 AM 08/03/2023    9:12 AM 08/01/2022   10:22 AM  GAD 7 : Generalized Anxiety Score  Nervous, Anxious, on Edge 0 0 0  Control/stop worrying 0 0 0  Worry too much - different things 0 0 0  Trouble relaxing 0 0 0  Restless  0 0 0  Easily annoyed or irritable 0 0 0  Afraid - awful might happen 0 0 0  Total GAD 7 Score 0 0 0  Anxiety Difficulty Not difficult at all Not difficult at all Not difficult at all      09/24/2021    9:46 AM 08/01/2022   10:22 AM 08/01/2022   10:27 AM 08/03/2023    9:10 AM 08/03/2023    9:12 AM  Fall Risk  Falls in the past year?  0 0 0 0  Was there an injury with Fall?  0 0 0 0  Fall Risk Category Calculator  0 0 0 0  Fall Risk Category (Retired)  Low  Low     (RETIRED) Patient Fall Risk Level Moderate fall risk  Low fall risk  Low fall risk     Patient at Risk for Falls Due to  No Fall Risks No Fall Risks No Fall Risks No Fall Risks  Fall risk Follow up  Falls evaluation completed  Falls evaluation completed  Falls evaluation completed Falls evaluation completed     Data saved with a previous flowsheet row definition    Functional Status Survey: Is the  patient deaf or have difficulty hearing?: No Does the patient have difficulty seeing, even when wearing glasses/contacts?: No Does the patient have difficulty concentrating, remembering, or making decisions?: No Does the patient have difficulty walking or climbing stairs?: No Does the patient have difficulty dressing or bathing?: No Does the patient have difficulty doing errands alone such as visiting a doctor's office or shopping?: No   Past Medical History:  Past Medical History:  Diagnosis Date   Complication of anesthesia    PONV (postoperative nausea and vomiting)     Surgical History:  Past Surgical History:  Procedure Laterality Date   ANKLE ARTHROSCOPY WITH OPEN REDUCTION INTERNAL FIXATION (ORIF) Right 01/04/2024   AUGMENTATION MAMMAPLASTY Bilateral 2010   BICEPT TENODESIS Right 09/24/2021   Procedure: BICEPS TENODESIS;  Surgeon: Addie Cordella Hamilton, MD;  Location: [facility] OR;  Service: Orthopedics;  Laterality: Right;   CERVICAL SPINE SURGERY  2003   C5-C7- double dissectomy   PAROTID GLAND TUMOR EXCISION     SHOULDER ARTHROSCOPY Right 09/24/2021   Procedure: right shoulder arthroscopy, debridement,open rotator cuff tear repair;  Surgeon: Addie Cordella Hamilton, MD;  Location: [facility] OR;  Service: Orthopedics;  Laterality: Right;   WISDOM TOOTH EXTRACTION Bilateral     Medications:  Current Outpatient Medications on File Prior to Visit  Medication Sig   Biotin 5 MG CAPS Take 5 mg by mouth daily.   CALCIUM PO Take 800 mg by mouth daily.   Cholecalciferol 50 MCG (2000 UT) CAPS Take 2,000 Units by mouth daily.   esomeprazole (NEXIUM) 20 MG capsule Take 20 mg by mouth daily.   Estradiol  (YUVAFEM ) 10 MCG TABS vaginal tablet Place 1 tablet (10 mcg total) vaginally twice a week   Melatonin 5 MG CAPS Take 5 mg by mouth at bedtime.   Probiotic Product (ALIGN PO) Take 1 tablet by mouth daily.   progesterone  (PROMETRIUM ) 200 MG capsule Take 1 capsule by mouth at bedtime.   testosterone   (ANDROGEL ) 50 MG/5GM (1%) GEL Apply 0.5cc daily in syringe to upper outer or inner thigh, at least an hour after shaving.   traMADol  (ULTRAM ) 50 MG tablet Take 25 mg by mouth every 6 (six) hours as needed for moderate pain (pain score 4-6).   traZODone  (DESYREL ) 50 MG tablet TAKE 2 TABLETS BY MOUTH  AT  BEDTIME   valACYclovir  (VALTREX ) 500 MG tablet Take 2 (two) tablets by mouth once daily for total of 5 days as needed for outbreaks.   No current facility-administered medications on file prior to visit.    Allergies:  Allergies  Allergen Reactions   Cyclobenzaprine     Altered mental state    Pertussis Vaccines Swelling    Social History:  Social History   Socioeconomic History   Marital status: Married    Spouse name: Not on file   Number of children: Not on file   Years of education: Not on file   Highest education level: Not on file  Occupational History   Not on file  Tobacco Use   Smoking status: Never   Smokeless tobacco: Never  Vaping Use   Vaping status: Never Used  Substance and Sexual Activity   Alcohol use: Yes    Comment: ocassional   Drug use: Never   Sexual activity: Yes  Other Topics Concern   Not on file  Social History Narrative   Not on file   Social Drivers of Health   Financial Resource Strain: Low Risk  (01/28/2024)   Received from [facility]   Overall Financial Resource Strain (CARDIA)    Difficulty of Paying Living Expenses: Not hard at all  Food Insecurity: No Food Insecurity (01/28/2024)   Received from [facility]   Hunger Vital Sign    Within the past 12 months, you worried that your food would run out before you got the money to buy more.: Never true    Within the past 12 months, the food you bought just didn't last and you didn't have money to get more.: Never true  Transportation Needs: No Transportation Needs (01/28/2024)   Received from [facility] - Transportation    In  the past 12 months, has lack of transportation kept you from medical appointments or from getting medications?: No    Lack of Transportation (Non-Medical): No  Physical Activity: Insufficiently Active (05/23/2020)   Exercise Vital Sign    Days of Exercise per Week: 3 days    Minutes of Exercise per Session: 30 min  Stress: Stress Concern Present (05/23/2020)   [employer] of Occupational [employer] - Occupational Stress Questionnaire    Feeling of Stress : Rather much  Social Connections: Moderately Isolated (05/23/2020)   Social Connection and Isolation Panel    Frequency of Communication with Friends and Family: Three times a week    Frequency of Social Gatherings with Friends and Family: Three times a week    Attends Religious Services: Never    Active Member of Clubs or Organizations: No    Attends Banker Meetings: Never    Marital Status: Married  Catering manager Violence: Not on file   Social History   Tobacco Use  Smoking Status Never  Smokeless Tobacco Never   Social History   Substance and Sexual Activity  Alcohol Use Yes   Comment: ocassional    Family History:  Family History  Problem Relation Age of Onset   Lung cancer Mother    Thyroid  disease Mother    Hypertension Mother    Hyperlipidemia Mother    CAD Mother        stent x 1   Osteoporosis Mother    Leukemia Father 80   Rheum arthritis Sister    Melanoma Sister    Osteopenia Sister    Autoimmune  disease Daughter    Colon cancer Maternal Uncle    Heart disease Maternal Grandmother    Colon cancer Maternal Grandfather    Heart disease Maternal Grandfather    Stroke Maternal Grandfather    Heart disease Paternal Grandfather     Past medical history, surgical history, medications, allergies, family history and social history reviewed with patient today and changes made to appropriate areas of the chart.   Review of Systems - negative All other ROS negative except what is listed above  and in the HPI.      Objective:    BP 121/75 (BP Location: Left Arm, Patient Position: Sitting, Cuff Size: Normal)   Pulse 86   Temp 98.1 F (36.7 C) (Oral)   Resp 15   Ht 5' 2.99 (1.6 m)   Wt 154 lb (69.9 kg)   SpO2 97%   BMI 27.29 kg/m   Wt Readings from Last 3 Encounters:  09/08/24 154 lb (69.9 kg)  01/02/24 145 lb (65.8 kg)  08/03/23 150 lb 9.6 oz (68.3 kg)    Physical Exam Vitals and nursing note reviewed. Exam conducted with a chaperone present.  Constitutional:      General: She is awake. She is not in acute distress.    Appearance: She is well-developed and well-groomed. She is not ill-appearing or toxic-appearing.  HENT:     Head: Normocephalic and atraumatic.     Right Ear: Hearing, tympanic membrane, ear canal and external ear normal. No drainage.     Left Ear: Hearing, tympanic membrane, ear canal and external ear normal. No drainage.     Nose: Nose normal.     Right Sinus: No maxillary sinus tenderness or frontal sinus tenderness.     Left Sinus: No maxillary sinus tenderness or frontal sinus tenderness.     Mouth/Throat:     Mouth: Mucous membranes are moist.     Pharynx: Oropharynx is clear. Uvula midline. No pharyngeal swelling, oropharyngeal exudate or posterior oropharyngeal erythema.  Eyes:     General: Lids are normal.        Right eye: No discharge.        Left eye: No discharge.     Extraocular Movements: Extraocular movements intact.     Conjunctiva/sclera: Conjunctivae normal.     Pupils: Pupils are equal, round, and reactive to light.     Visual Fields: Right eye visual fields normal and left eye visual fields normal.  Neck:     Thyroid : No thyromegaly.     Vascular: No carotid bruit.     Trachea: Trachea normal.  Cardiovascular:     Rate and Rhythm: Normal rate and regular rhythm.     Heart sounds: Normal heart sounds. No murmur heard.    No gallop.  Pulmonary:     Effort: Pulmonary effort is normal. No accessory muscle usage or  respiratory distress.     Breath sounds: Normal breath sounds.  Chest:  Breasts:    Right: Normal.     Left: Normal.  Abdominal:     General: Bowel sounds are normal.     Palpations: Abdomen is soft. There is no hepatomegaly or splenomegaly.     Tenderness: There is no abdominal tenderness.  Musculoskeletal:        General: Normal range of motion.     Cervical back: Normal range of motion and neck supple.     Right lower leg: No edema.     Left lower leg: No edema.  Lymphadenopathy:  Head:     Right side of head: No submental, submandibular, tonsillar, preauricular or posterior auricular adenopathy.     Left side of head: No submental, submandibular, tonsillar, preauricular or posterior auricular adenopathy.     Cervical: No cervical adenopathy.     Upper Body:     Right upper body: No supraclavicular, axillary or pectoral adenopathy.     Left upper body: No supraclavicular, axillary or pectoral adenopathy.  Skin:    General: Skin is warm and dry.     Capillary Refill: Capillary refill takes less than 2 seconds.     Findings: No rash.  Neurological:     Mental Status: She is alert and oriented to person, place, and time.     Gait: Gait is intact.     Deep Tendon Reflexes: Reflexes are normal and symmetric.     Reflex Scores:      Brachioradialis reflexes are 2+ on the right side and 2+ on the left side.      Patellar reflexes are 2+ on the right side and 2+ on the left side. Psychiatric:        Attention and Perception: Attention normal.        Mood and Affect: Mood normal.        Speech: Speech normal.        Behavior: Behavior normal. Behavior is cooperative.        Thought Content: Thought content normal.        Judgment: Judgment normal.    Results for orders placed or performed in visit on 09/08/24  Cytology - PAP   Collection Time: 01/28/24 12:00 AM  Result Value Ref Range   CYTOLOGY - PAP negative    HPV Aptima negative       Assessment & Plan:   Problem  List Items Addressed This Visit       Digestive   GERD (gastroesophageal reflux disease)   Chronic, stable.  Continue daily Nexium and adjust regimen as needed.  Had colonoscopy and EGD in 2020.          Musculoskeletal and Integument   Osteopenia - Primary   Ongoing, noted on DEXA. Continue daily Vitamin D3 and Calcium supplements.  Repeat DEXA in 2 years (04/13/25), family history and hormone treatment.  Vit D level today.      Relevant Orders   VITAMIN D  25 Hydroxy (Vit-D Deficiency, Fractures)     Other   Postmenopausal hormone therapy   Continue collaboration with GYN at [facility].      Relevant Orders   CBC with Differential/Platelet   TSH   Insomnia   Chronic, stable.  Continue current medication regimen, Trazodone  75-100 MG as needed.  Continue focus on good sleep hygiene techniques at home.        Elevated low density lipoprotein (LDL) cholesterol level   Noted on past labs.  Recheck today. Continue diet and exercise focus at home. The 10-year ASCVD risk score (Arnett DK, et al., 2019) is: 3.6%   Values used to calculate the score:     Age: 70 years     Clincally relevant sex: Female     Is Non-Hispanic African American: No     Diabetic: No     Tobacco smoker: No     Systolic Blood Pressure: 121 mmHg     Is BP treated: No     HDL Cholesterol: 65 mg/dL     Total Cholesterol: 193 mg/dL  Relevant Orders   Comprehensive metabolic panel with GFR   Lipid Panel w/o Chol/HDL Ratio   Other Visit Diagnoses       Pneumococcal vaccination given       PCV20 today, educated patient.   Relevant Orders   Pneumococcal conjugate vaccine 20-valent (Completed)     Flu vaccine need       Flu shot today, educated patient.   Relevant Orders   Flu vaccine trivalent PF, 6mos and older(Flulaval,Afluria,Fluarix,Fluzone) (Completed)     Colon cancer screening       GI referral placed.   Relevant Orders   Ambulatory referral to Gastroenterology     Encounter for  annual physical exam       Annual physical today with labs and health maintenance reviewed, discussed with patient.        Follow up plan: Return in about 1 year (around 09/08/2025) for Annual Physical.  LABORATORY TESTING:  - Pap smear: up to date  IMMUNIZATIONS:   - Tdap: Tetanus vaccination status reviewed: last tetanus booster within 10 years. - Influenza: Up to date - Pneumovax: Not applicable - Prevnar:Up To Date - HPV: Not applicable - Zostavax vaccine: will get in future  SCREENING: -Mammogram: Up To Date next due 06/16/24 - Colonoscopy: Up to date  - Bone Density: Up To Date -- on 04/14/23 -- osteopenia -- continues on Vitamin D  -Hearing Test: Not applicable  -Spirometry: Not applicable   PATIENT COUNSELING:   Advised to take 1 mg of folate supplement per day if capable of pregnancy.   Sexuality: Discussed sexually transmitted diseases, partner selection, use of condoms, avoidance of unintended pregnancy  and contraceptive alternatives.   Advised to avoid cigarette smoking.  I discussed with the patient that most people either abstain from alcohol or drink within safe limits (<=14/week and <=4 drinks/occasion for males, <=7/weeks and <= 3 drinks/occasion for females) and that the risk for alcohol disorders and other health effects rises proportionally with the number of drinks per week and how often a drinker exceeds daily limits.  Discussed cessation/primary prevention of drug use and availability of treatment for abuse.   Diet: Encouraged to adjust caloric intake to maintain  or achieve ideal body weight, to reduce intake of dietary saturated fat and total fat, to limit sodium intake by avoiding high sodium foods and not adding table salt, and to maintain adequate dietary potassium and calcium preferably from fresh fruits, vegetables, and low-fat dairy products.    Stressed the importance of regular exercise  Injury prevention: Discussed safety belts, safety helmets,  smoke detector, smoking near bedding or upholstery.   Dental health: Discussed importance of regular tooth brushing, flossing, and dental visits.    NEXT PREVENTATIVE PHYSICAL DUE IN 1 YEAR. Return in about 1 year (around 09/08/2025) for Annual Physical.

## 2024-09-09 ENCOUNTER — Ambulatory Visit: Payer: Self-pay | Admitting: Nurse Practitioner

## 2024-09-09 DIAGNOSIS — M25572 Pain in left ankle and joints of left foot: Secondary | ICD-10-CM | POA: Diagnosis not present

## 2024-09-09 DIAGNOSIS — S82872A Displaced pilon fracture of left tibia, initial encounter for closed fracture: Secondary | ICD-10-CM | POA: Diagnosis not present

## 2024-09-09 LAB — COMPREHENSIVE METABOLIC PANEL WITH GFR
ALT: 14 IU/L (ref 0–32)
AST: 18 IU/L (ref 0–40)
Albumin: 4.4 g/dL (ref 3.9–4.9)
Alkaline Phosphatase: 102 IU/L (ref 49–135)
BUN/Creatinine Ratio: 29 — ABNORMAL HIGH (ref 12–28)
BUN: 23 mg/dL (ref 8–27)
Bilirubin Total: 0.4 mg/dL (ref 0.0–1.2)
CO2: 22 mmol/L (ref 20–29)
Calcium: 8.9 mg/dL (ref 8.7–10.3)
Chloride: 101 mmol/L (ref 96–106)
Creatinine, Ser: 0.8 mg/dL (ref 0.57–1.00)
Globulin, Total: 2 g/dL (ref 1.5–4.5)
Glucose: 90 mg/dL (ref 70–99)
Potassium: 4.4 mmol/L (ref 3.5–5.2)
Sodium: 137 mmol/L (ref 134–144)
Total Protein: 6.4 g/dL (ref 6.0–8.5)
eGFR: 83 mL/min/1.73 (ref >59)

## 2024-09-09 LAB — CBC WITH DIFFERENTIAL/PLATELET
Basophils Absolute: 0 x10E3/uL (ref 0.0–0.2)
Basos: 0 %
EOS (ABSOLUTE): 0.1 x10E3/uL (ref 0.0–0.4)
Eos: 2 %
Hematocrit: 40.7 % (ref 34.0–46.6)
Hemoglobin: 13 g/dL (ref 11.1–15.9)
Immature Grans (Abs): 0 x10E3/uL (ref 0.0–0.1)
Immature Granulocytes: 0 %
Lymphocytes Absolute: 1.7 x10E3/uL (ref 0.7–3.1)
Lymphs: 37 %
MCH: 28.7 pg (ref 26.6–33.0)
MCHC: 31.9 g/dL (ref 31.5–35.7)
MCV: 90 fL (ref 79–97)
Monocytes Absolute: 0.3 x10E3/uL (ref 0.1–0.9)
Monocytes: 7 %
Neutrophils Absolute: 2.5 x10E3/uL (ref 1.4–7.0)
Neutrophils: 54 %
Platelets: 217 x10E3/uL (ref 150–450)
RBC: 4.53 x10E6/uL (ref 3.77–5.28)
RDW: 12.2 % (ref 11.7–15.4)
WBC: 4.7 x10E3/uL (ref 3.4–10.8)

## 2024-09-09 LAB — LIPID PANEL W/O CHOL/HDL RATIO
Cholesterol, Total: 170 mg/dL (ref 100–199)
HDL: 59 mg/dL (ref >39)
LDL Chol Calc (NIH): 95 mg/dL (ref 0–99)
Triglycerides: 89 mg/dL (ref 0–149)
VLDL Cholesterol Cal: 16 mg/dL (ref 5–40)

## 2024-09-09 LAB — VITAMIN D 25 HYDROXY (VIT D DEFICIENCY, FRACTURES): Vit D, 25-Hydroxy: 42.9 ng/mL (ref 30.0–100.0)

## 2024-09-09 LAB — TSH: TSH: 2.23 u[IU]/mL (ref 0.450–4.500)

## 2024-09-09 NOTE — Progress Notes (Signed)
 Contacted via MyChart  Good evening [name], your labs have returned and overall look fantastic. Even lipid panel shows LDL coming down. Great job!!  Keep up the good work.  Any questions? Keep being amazing!!  Thank you for allowing me to participate in your care.  I appreciate you. Kindest regards, Jazariah Teall

## 2024-09-10 ENCOUNTER — Encounter: Payer: Self-pay | Admitting: Nurse Practitioner

## 2024-09-16 ENCOUNTER — Encounter: Payer: Self-pay | Admitting: Nurse Practitioner

## 2024-09-21 ENCOUNTER — Telehealth: Payer: Self-pay | Admitting: Physical Therapy

## 2024-09-21 NOTE — Telephone Encounter (Signed)
 Called pt to inquire about whether she would like to move up her eval due increased availability in schedule for today and tomorrow. Pt is unable to move up apt and she does intend to come in on Monday for her originally scheduled eval.

## 2024-09-26 ENCOUNTER — Encounter: Payer: Self-pay | Admitting: Physical Therapy

## 2024-09-26 ENCOUNTER — Ambulatory Visit: Attending: Orthopaedic Surgery | Admitting: Physical Therapy

## 2024-09-26 DIAGNOSIS — M76822 Posterior tibial tendinitis, left leg: Secondary | ICD-10-CM | POA: Insufficient documentation

## 2024-09-26 DIAGNOSIS — M25572 Pain in left ankle and joints of left foot: Secondary | ICD-10-CM | POA: Insufficient documentation

## 2024-09-26 NOTE — Therapy (Signed)
 OUTPATIENT PHYSICAL THERAPY LOWER EXTREMITY EVALUATION   Patient Name: [name] MRN: [id number] DOB:08-17-1961, 63 y.o., female Today's Date: 09/26/2024  END OF SESSION:  PT End of Session - 09/26/24 1147     Visit Number 1    Number of Visits 20    Date for Recertification  12/05/24    Authorization Type [employer]  2025    Authorization - Visit Number 1    Authorization - Number of Visits 20    Progress Note Due on Visit 10    PT Start Time 1030    PT Stop Time 1115    PT Time Calculation (min) 45 min    Activity Tolerance Patient limited by pain    Behavior During Therapy [facility] for tasks assessed/performed          Past Medical History:  Diagnosis Date   Complication of anesthesia    PONV (postoperative nausea and vomiting)    Past Surgical History:  Procedure Laterality Date   ANKLE ARTHROSCOPY WITH OPEN REDUCTION INTERNAL FIXATION (ORIF) Right 01/04/2024   AUGMENTATION MAMMAPLASTY Bilateral 2010   BICEPT TENODESIS Right 09/24/2021   Procedure: BICEPS TENODESIS;  Surgeon: Addie Cordella Hamilton, MD;  Location: [facility] OR;  Service: Orthopedics;  Laterality: Right;   CERVICAL SPINE SURGERY  2003   C5-C7- double dissectomy   PAROTID GLAND TUMOR EXCISION     SHOULDER ARTHROSCOPY Right 09/24/2021   Procedure: right shoulder arthroscopy, debridement,open rotator cuff tear repair;  Surgeon: Addie Cordella Hamilton, MD;  Location: [facility] OR;  Service: Orthopedics;  Laterality: Right;   WISDOM TOOTH EXTRACTION Bilateral    Patient Active Problem List   Diagnosis Date Noted   Elevated low density lipoprotein (LDL) cholesterol level 08/01/2023   Healthcare maintenance 07/31/2021   Congenital pes cavus 02/25/2021   History of uterine fibroid 01/05/2021   Herpes 07/12/2020   GERD (gastroesophageal reflux disease) 05/23/2020   Vaginal atrophy 05/23/2020   Cervical spine arthritis 05/23/2020   Osteopenia 05/23/2020   Insomnia 05/20/2020   Postmenopausal hormone therapy 05/20/2020     PCP: Dr. Jolene Cannady    REFERRING PROVIDER: Dr. Lonni Pae     REFERRING DIAG: [phone] (ICD-10-CM) - Left ankle pain  THERAPY DIAG:  Pain in left ankle and joints of left foot  Posterior tibial tendon dysfunction, left  Rationale for Evaluation and Treatment: Rehabilitation  ONSET DATE:  January 2025    SUBJECTIVE:   SUBJECTIVE STATEMENT: See pertinent history    PERTINENT HISTORY: Pt is following up due to ongoing left ankle pain that is localized on the posterior tibial tendon that started after traumatic skiing accident that resulted in a pylon ankle fracture and need for extensive surgery. She has had to continue to utilize brace on left ankle due to severe pain when she is not using brace especially when walking for long periods of time or going down stairs or a curb. She notes increased swelling in the medial portion of left ankle that does not decrease. Pt just followed up with Dr. Pae who suggested that this ongoing pain could be from impingement of the posterior tibial tendon on hardware of left ankle and he referred her for additional physical therapy to see if this would help before suggesting further treatments.   PAIN:  Are you having pain? Yes: NPRS scale: 5-6/10 at worst when walking and going down stairs Pain location: Posterior tibial tendon portion on medial malleolus of left ankle   Pain description: Sharp especially when weight bearing through left  ankle   Aggravating factors: Walking and walking down stairs and  Relieving factors: Non-weight bearing positions like sitting    PRECAUTIONS: None  RED FLAGS: None   WEIGHT BEARING RESTRICTIONS: No  FALLS:  Has patient fallen in last 6 months? No  LIVING ENVIRONMENT: Lives with: lives with their spouse Lives in: House/apartment Stairs: Yes: External: 3 steps; on right going up Has following equipment at home: Single point cane  OCCUPATION: Nurse    PLOF: Independent  PATIENT GOALS: To  know how to address her left posterior tib pain with exercise.  NEXT MD VISIT: Nov 2025    OBJECTIVE:  Note: Objective measures were completed at Evaluation unless otherwise noted.  VITALS  BP 99/48 HR 89 SpO2 98   DIAGNOSTIC FINDINGS: CLINICAL DATA:  Ankle trauma, fracture, xray done (Age >= 5y)   EXAM: CT OF THE LEFT ANKLE WITHOUT CONTRAST   TECHNIQUE: Multidetector CT imaging of the left ankle was performed according to the standard protocol. Multiplanar CT image reconstructions were also generated.   RADIATION DOSE REDUCTION: This exam was performed according to the departmental dose-optimization program which includes automated exposure control, adjustment of the mA and/or kV according to patient size and/or use of iterative reconstruction technique.   COMPARISON:  None Available.   FINDINGS: Bones/Joint/Cartilage   Acute, complex trimalleolar fracture of the left ankle. Vertically oriented fracture through the base of the medial malleolus with mild medial displacement resulting in approximately 4 mm of articular-surface diastasis. Comminuted intra-articular fracture of the tibial plafond and with vertical extension into the distal metaphysis. Approximately 5 mm of articular-surface depression and diastasis. Transversely oriented fracture through the distal fibular metaphysis without significant displacement. Medial subluxation of the talar dome relative to the distal tibia without dislocation.   Bones of the hindfoot and midfoot are intact without fracture or malalignment. Joint spaces are preserved.   Ligaments   Suboptimally assessed by CT.   Muscles and Tendons   No acute musculotendinous abnormality by CT although position of the tibialis posterior tendon and flexor digitorum longus tendons could be at risk for entrapment.   Soft tissues   Soft tissue swelling with ill-defined hematoma about the ankle, more pronounced medially. No soft tissue air to  suggest open fracture.   IMPRESSION: 1. Acute, complex trimalleolar fracture of the left ankle, as described above. 2. Medial subluxation of the talar dome relative to the distal tibia without dislocation. 3. Soft tissue swelling with ill-defined hematoma about the ankle, more pronounced medially.  PATIENT SURVEYS:  Foot and Ankle Ability Measure (FAAM): 48/74 (57%)   COGNITION: Overall cognitive status: Within functional limits for tasks assessed     SENSATION: WFL  EDEMA:  Figure 8: NT   MUSCLE LENGTH: Not performed   Hamstrings: NT   Thomas test: NT    POSTURE: No Significant postural limitations  PALPATION: TTP left posterior tendon running along medial ankle with increased edema    LOWER EXTREMITY ROM:  Active ROM Right eval Left eval  Hip flexion    Hip extension    Hip abduction    Hip adduction    Hip internal rotation    Hip external rotation    Knee flexion    Knee extension    Ankle dorsiflexion 20 20  Ankle plantarflexion 50 50  Ankle inversion 35 35  Ankle eversion 15 15   (Blank rows = not tested)       LOWER EXTREMITY MMT:  MMT Right eval Left eval  Hip  flexion    Hip extension    Hip abduction    Hip adduction    Hip internal rotation    Hip external rotation    Knee flexion    Knee extension    Ankle dorsiflexion 4 4  Ankle plantarflexion 4 4  Ankle inversion 4 4  Ankle eversion 4 4   (Blank rows = not tested)    FUNCTIONAL TESTS:  6 minute walk test: NT 10 mWT: NT    GAIT: Distance walked: 30 ft  Assistive device utilized: None Level of assistance: Complete Independence Comments: Decreased toe off on left foot with increased left ankle supination in standing and decreased pronation and dorsiflexion during midstance portion of gait.                                                                                                                                 TREATMENT DATE:   09/26/24: THEREX Small ball  squeeze heel raises with shoes 1 X 15 Small ball squeeze heel raises without shoes 1 x 15    Explanation about gait deficits including decreased left ankle dorsiflexion and pronation during midstance portion of gait resulting in decreased toe off and ankle instability.   Recommendation to continue to utilize ankle brace on left ankle while walking and to ice everyday to increase level of edema in medial portion of left ankle.     PATIENT EDUCATION:  Education details: Form and technique for correct performance of exercise and explanation about treatment plan for addressing posterior tib inflammation   Person educated: Patient Education method: Explanation, Demonstration, Verbal cues, and Handouts Education comprehension: verbalized understanding, returned demonstration, and verbal cues required  HOME EXERCISE PROGRAM: Access Code: [email or web address] URL: [email or web address] Date: 09/26/2024 Prepared by: Toribio Servant  Exercises - Standing Calf Raise With Small Ball at Heels  - 3-4 x weekly - 3 sets - 10 reps  ASSESSMENT:  CLINICAL IMPRESSION: Patient is a 63 y.o. white female who was seen today for physical therapy evaluation and treatment for left ankle pain in the setting of posterior tibial tendinitis. She demonstrates gait deficits that include decreased toe off and decreased pronation during midstance portion of gait which is likely from pain provocation of posterior tibial tendon. She does not have a decrease in her arch height of left ankle and she is able to perform resisted ankle inversion indicating that if there is a tear then it is not severe. Her deficits included decreased left ankle mobility during midstance portion of gait, increased pain with dorsiflexion and weight bearing of left ankle, and abnormal gait including decreased toe off. 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       OBJECTIVE  IMPAIRMENTS: Abnormal gait, decreased balance, decreased endurance, difficulty walking, hypomobility, increased edema, and pain.   ACTIVITY LIMITATIONS: carrying, lifting, standing, squatting, stairs, hygiene/grooming, and locomotion level  PARTICIPATION LIMITATIONS: shopping, community activity, and occupation  PERSONAL FACTORS: Time since onset of injury/illness/exacerbation are also affecting patient's functional outcome.   REHAB POTENTIAL: Fair severity of ankle surgery and chronicity of issue.   CLINICAL DECISION MAKING: Stable/uncomplicated  EVALUATION COMPLEXITY: Low   GOALS: Goals reviewed with patient? No  SHORT TERM GOALS: Target date: 10/10/2024   Patient will demonstrate undestanding of home exercise plan by performing exercises correctly with evidence of good carry over with min to no verbal or tactile cues .   Baseline: NT  Goal status: INITIAL    LONG TERM GOALS: Target date: 12/06/2023    Patient will demonstrate a minimally statistically significant difference in left ankle function as evidenced by an improvement in foot and ankle ability measure (FAAM) by >=8% [place]onda 2005).  Baseline: 57% (48/74) Goal status: INITIAL  2.  Patient will show improved left ankle function with ability to descend stairs forward facing and without side stepping for >=13 steps.  Baseline: Needs to side step when descending stairs.  Goal status: INITIAL  3.  Patient will be able to ambulate >=1,000 ft in without exceeding ankle pain >=5-6/10 NRPS in medial portion of left ankle as evidence of improved left ankle function.  Baseline: NT   Goal status: INITIAL  4.  Patient will demonstrate improved left ankle mechanics during midstance portion of gait cycle with increased ankle dorsiflexion and pronation as evidence of  improved ankle function and ability to stabilize left knee for improved gait stability.  Baseline: Currently supinates left ankle in stance phase when compared to right ankle Goal status: INITIAL  5.  Patient will show improved left ankle tissue healing and function with figure 8 circumferential measurement that is within 10% of right ankle.   Baseline: NT  Goal status: INITIAL     PLAN:  PT FREQUENCY: 1-2x/week  PT DURATION: 10 weeks  PLANNED INTERVENTIONS: 97164- PT Re-evaluation, 97750- Physical Performance Testing, 97110-Therapeutic exercises, 97530- Therapeutic activity, [id number]- Neuromuscular re-education, 97535- Self Care, [id number]- Manual therapy, [phone]- Gait training, [phone]- Aquatic Therapy, [phone]- Splinting, [id number]- Electrical stimulation (unattended), [phone]- Electrical stimulation (manual), [id number]- Ultrasound, [id number]- Traction (mechanical), 20560 (1-2 muscles), 20561 (3+ muscles)- Dry Needling, Patient/Family education, Balance training, Stair training, Taping, Joint mobilization, Joint manipulation, Spinal manipulation, Spinal mobilization, Scar mobilization, Compression bandaging, DME instructions, Cryotherapy, and Moist heat  PLAN FOR NEXT SESSION: Figure 8 for ankle, Single Leg Balance Test. Analyze left ankle when stepping down from step  and without donning ankle foot orthosis brace. Gait Training in midstance to determine pain provocation and further deficits.    Toribio Servant PT, DPT  [facility] [address] [place], [address] Phone: [phone]   Fax:  [phone]

## 2024-09-27 ENCOUNTER — Ambulatory Visit: Admitting: Physical Therapy

## 2024-09-27 ENCOUNTER — Encounter: Payer: Self-pay | Admitting: Physical Therapy

## 2024-09-27 DIAGNOSIS — M25572 Pain in left ankle and joints of left foot: Secondary | ICD-10-CM

## 2024-09-27 DIAGNOSIS — M76822 Posterior tibial tendinitis, left leg: Secondary | ICD-10-CM

## 2024-09-27 NOTE — Therapy (Signed)
 OUTPATIENT PHYSICAL THERAPY LOWER EXTREMITY EVALUATION   Patient Name: [name] MRN: [id number] DOB:02/22/1961, 63 y.o., female Today's Date: 09/27/2024  END OF SESSION:  PT End of Session - 09/27/24 1427     Visit Number 2    Number of Visits 20    Date for Recertification  12/05/24    Authorization Type [employer]  2025    Authorization - Visit Number 2    Authorization - Number of Visits 20    Progress Note Due on Visit 10    PT Start Time 1345    PT Stop Time 1430    PT Time Calculation (min) 45 min    Activity Tolerance Patient limited by pain    Behavior During Therapy [facility] for tasks assessed/performed          Past Medical History:  Diagnosis Date   Complication of anesthesia    PONV (postoperative nausea and vomiting)    Past Surgical History:  Procedure Laterality Date   ANKLE ARTHROSCOPY WITH OPEN REDUCTION INTERNAL FIXATION (ORIF) Right 01/04/2024   AUGMENTATION MAMMAPLASTY Bilateral 2010   BICEPT TENODESIS Right 09/24/2021   Procedure: BICEPS TENODESIS;  Surgeon: Addie Cordella Hamilton, MD;  Location: [facility] OR;  Service: Orthopedics;  Laterality: Right;   CERVICAL SPINE SURGERY  2003   C5-C7- double dissectomy   PAROTID GLAND TUMOR EXCISION     SHOULDER ARTHROSCOPY Right 09/24/2021   Procedure: right shoulder arthroscopy, debridement,open rotator cuff tear repair;  Surgeon: Addie Cordella Hamilton, MD;  Location: [facility] OR;  Service: Orthopedics;  Laterality: Right;   WISDOM TOOTH EXTRACTION Bilateral    Patient Active Problem List   Diagnosis Date Noted   Elevated low density lipoprotein (LDL) cholesterol level 08/01/2023   Healthcare maintenance 07/31/2021   Congenital pes cavus 02/25/2021   History of uterine fibroid 01/05/2021   Herpes 07/12/2020   GERD (gastroesophageal reflux disease) 05/23/2020   Vaginal atrophy 05/23/2020   Cervical spine arthritis 05/23/2020   Osteopenia 05/23/2020   Insomnia 05/20/2020   Postmenopausal hormone therapy 05/20/2020     PCP: Dr. Jolene Cannady    REFERRING PROVIDER: Dr. Lonni Pae     REFERRING DIAG: [phone] (ICD-10-CM) - Left ankle pain  THERAPY DIAG:  Pain in left ankle and joints of left foot  Posterior tibial tendon dysfunction, left  Rationale for Evaluation and Treatment: Rehabilitation  ONSET DATE:  January 2025    SUBJECTIVE:   SUBJECTIVE STATEMENT: Pt reports increased stiffness in left ankle.     PERTINENT HISTORY: Pt is following up due to ongoing left ankle pain that is localized on the posterior tibial tendon that started after traumatic skiing accident that resulted in a pylon ankle fracture and need for extensive surgery. She has had to continue to utilize brace on left ankle due to severe pain when she is not using brace especially when walking for long periods of time or going down stairs or a curb. She notes increased swelling in the medial portion of left ankle that does not decrease. Pt just followed up with Dr. Pae who suggested that this ongoing pain could be from impingement of the posterior tibial tendon on hardware of left ankle and he referred her for additional physical therapy to see if this would help before suggesting further treatments.   PAIN:  Are you having pain? Yes: NPRS scale: 5-6/10 at worst when walking and going down stairs Pain location: Posterior tibial tendon portion on medial malleolus of left ankle   Pain description: Sharp especially  when weight bearing through left ankle   Aggravating factors: Walking and walking down stairs and  Relieving factors: Non-weight bearing positions like sitting    PRECAUTIONS: None  RED FLAGS: None   WEIGHT BEARING RESTRICTIONS: No  FALLS:  Has patient fallen in last 6 months? No  LIVING ENVIRONMENT: Lives with: lives with their spouse Lives in: House/apartment Stairs: Yes: External: 3 steps; on right going up Has following equipment at home: Single point cane  OCCUPATION: Nurse    PLOF:  Independent  PATIENT GOALS: To know how to address her left posterior tib pain with exercise.  NEXT MD VISIT: Nov 2025    OBJECTIVE:  Note: Objective measures were completed at Evaluation unless otherwise noted.  VITALS  BP 99/48 HR 89 SpO2 98   DIAGNOSTIC FINDINGS: CLINICAL DATA:  Ankle trauma, fracture, xray done (Age >= 5y)   EXAM: CT OF THE LEFT ANKLE WITHOUT CONTRAST   TECHNIQUE: Multidetector CT imaging of the left ankle was performed according to the standard protocol. Multiplanar CT image reconstructions were also generated.   RADIATION DOSE REDUCTION: This exam was performed according to the departmental dose-optimization program which includes automated exposure control, adjustment of the mA and/or kV according to patient size and/or use of iterative reconstruction technique.   COMPARISON:  None Available.   FINDINGS: Bones/Joint/Cartilage   Acute, complex trimalleolar fracture of the left ankle. Vertically oriented fracture through the base of the medial malleolus with mild medial displacement resulting in approximately 4 mm of articular-surface diastasis. Comminuted intra-articular fracture of the tibial plafond and with vertical extension into the distal metaphysis. Approximately 5 mm of articular-surface depression and diastasis. Transversely oriented fracture through the distal fibular metaphysis without significant displacement. Medial subluxation of the talar dome relative to the distal tibia without dislocation.   Bones of the hindfoot and midfoot are intact without fracture or malalignment. Joint spaces are preserved.   Ligaments   Suboptimally assessed by CT.   Muscles and Tendons   No acute musculotendinous abnormality by CT although position of the tibialis posterior tendon and flexor digitorum longus tendons could be at risk for entrapment.   Soft tissues   Soft tissue swelling with ill-defined hematoma about the ankle, more pronounced  medially. No soft tissue air to suggest open fracture.   IMPRESSION: 1. Acute, complex trimalleolar fracture of the left ankle, as described above. 2. Medial subluxation of the talar dome relative to the distal tibia without dislocation. 3. Soft tissue swelling with ill-defined hematoma about the ankle, more pronounced medially.  PATIENT SURVEYS:  Foot and Ankle Ability Measure (FAAM): 48/74 (57%)   COGNITION: Overall cognitive status: Within functional limits for tasks assessed     SENSATION: WFL  EDEMA:  Figure 8: NT   MUSCLE LENGTH: Not performed   Hamstrings: NT   Thomas test: NT    POSTURE: No Significant postural limitations  PALPATION: TTP left posterior tendon running along medial ankle with increased edema    LOWER EXTREMITY ROM:  Active ROM Right eval Left eval  Hip flexion    Hip extension    Hip abduction    Hip adduction    Hip internal rotation    Hip external rotation    Knee flexion    Knee extension    Ankle dorsiflexion 20 20  Ankle plantarflexion 50 50  Ankle inversion 35 35  Ankle eversion 15 15   (Blank rows = not tested)       LOWER EXTREMITY MMT:  MMT Right  eval Left eval  Hip flexion    Hip extension    Hip abduction    Hip adduction    Hip internal rotation    Hip external rotation    Knee flexion    Knee extension    Ankle dorsiflexion 4 4  Ankle plantarflexion 4 4  Ankle inversion 4 4  Ankle eversion 4 4   (Blank rows = not tested)    FUNCTIONAL TESTS:  6 minute walk test: NT 10 mWT: NT    GAIT: Distance walked: 30 ft  Assistive device utilized: None Level of assistance: Complete Independence Comments: Decreased toe off on left foot with increased left ankle supination in standing and decreased pronation and dorsiflexion during midstance portion of gait.                                                                                                                                 TREATMENT DATE:    09/27/24   NMR   Single leg balance on LLE 10 sec hold   x 5   -Pt unable to maintain balance without UE support   Modified single leg balance on LLE with RLE on 8 inch yoga block 3 x 30 sec    -moderate sway with ankle strategy     GAIT TRAINING   Static Heel to toe on LLE with UE support  1 x 10  -Pt reports increased pain in toe midstance to toe off portion on left tib posterior tendon   15 ft overground walking X 10   -Pt reports increased pain in toe midstance to toe off portion on left tib posterior tendon    THEREX    Matrix recumbent bicycle with seat at 7 and resistance at 3 for 5 min   Concentric Figure 4 LLE ankle inversion 1 x 10   -Pt reports increased left post tib pain   Isometric Figure 4 LLE ankle inversion with 5 sec hold 1 x 10   Isometric ball squeeze ankle inversion with 5 sec hold 1 x 10    09/26/24: THEREX Small ball squeeze heel raises with shoes 1 X 15 Small ball squeeze heel raises without shoes 1 x 15    Explanation about gait deficits including decreased left ankle dorsiflexion and pronation during midstance portion of gait resulting in decreased toe off and ankle instability.   Recommendation to continue to utilize ankle brace on left ankle while walking and to ice everyday to increase level of edema in medial portion of left ankle.     PATIENT EDUCATION:  Education details: Form and technique for correct performance of exercise and explanation about treatment plan for addressing posterior tib inflammation   Person educated: Patient Education method: Explanation, Demonstration, Verbal cues, and Handouts Education comprehension: verbalized understanding, returned demonstration, and verbal cues required  HOME EXERCISE PROGRAM: Access Code: [email or web address] URL: [email or web address] Date: 09/27/2024 Prepared by: Toribio Servant  Program Notes [place]  squeeze with sides of feet 2 x 10  for 5 sec hold 7 days per  [email or web address]  Exercises - Standing Calf Raise With Small Ball at Heels  - 3-4 x weekly - 3 sets - 10 reps - Single Leg Stance  - 1 x daily - 5-7 x weekly - 1 sets - 3 reps - 30 sec  hold  ASSESSMENT:  CLINICAL IMPRESSION: Pt presents for follow up for left ankle pain in the setting of posterior tib tendinitis from prior pylon fracture and repair of left ankle. She continues to experience increased pain with mid stance and toe off and with concentric strengthening of posterior tib tendon. Pt focused on improving left posterior tib strength within pain free range using isometrics as well as left ankle proprioception and balance by performing modified single leg balance task. 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    OBJECTIVE IMPAIRMENTS: Abnormal gait, decreased balance, decreased endurance, difficulty walking, hypomobility, increased edema, and pain.   ACTIVITY LIMITATIONS: carrying, lifting, standing, squatting, stairs, hygiene/grooming, and locomotion level  PARTICIPATION LIMITATIONS: shopping, community activity, and occupation  PERSONAL FACTORS: Time since onset of injury/illness/exacerbation are also affecting patient's functional outcome.   REHAB POTENTIAL: Fair severity of ankle surgery and chronicity of issue.   CLINICAL DECISION MAKING: Stable/uncomplicated  EVALUATION COMPLEXITY: Low   GOALS: Goals reviewed with patient? No  SHORT TERM GOALS: Target date: 10/10/2024   Patient will demonstrate undestanding of home exercise plan by performing exercises correctly with evidence of good carry over with min to no verbal or tactile cues .   Baseline: NT  Goal status: INITIAL    LONG TERM GOALS: Target  date: 12/06/2023    Patient will demonstrate a minimally statistically significant difference in left ankle function as evidenced by an improvement in foot and ankle ability measure (FAAM) by >=8% [place] 2005).  Baseline: 57% (48/74) Goal status: INITIAL  2.  Patient will show improved left ankle function with ability to descend stairs forward facing and without side stepping for >=13 steps.  Baseline: Needs to side step when descending stairs.  Goal status: INITIAL  3.  Patient will be able to ambulate >=1,000 ft in without exceeding ankle pain >=5-6/10 NRPS in medial portion of left ankle as evidence of improved left ankle function.  Baseline: NT   Goal status: INITIAL  4.  Patient will demonstrate improved left ankle mechanics during midstance portion of gait cycle with increased ankle dorsiflexion and pronation as evidence of improved ankle function and ability to stabilize left knee for improved gait stability.  Baseline: Currently supinates left ankle in stance phase when compared to right ankle Goal status: INITIAL  5.  Patient will show improved left ankle tissue healing and function with figure 8 circumferential measurement that is within 10% of right ankle.   Baseline: NT  Goal status: INITIAL     PLAN:  PT FREQUENCY: 1-2x/week  PT DURATION: 10 weeks  PLANNED INTERVENTIONS: [id number]- PT Re-evaluation, 97750- Physical Performance Testing, 97110-Therapeutic exercises, 97530- Therapeutic activity, [id number]- Neuromuscular re-education, 97535- Self Care, [id number]- Manual therapy, [id number]- Gait training, [id number]- Aquatic Therapy, [id number]- Splinting, H9716- Electrical stimulation (unattended), [id number]- Electrical stimulation (manual), [id number]- Ultrasound, [id number]- Traction (mechanical), [id number] (1-2 muscles), [id number] (3+ muscles)- Dry Needling, Patient/Family  education, Balance training, Stair training, Taping, Joint mobilization, Joint manipulation, Spinal manipulation, Spinal mobilization, Scar  mobilization, Compression bandaging, DME instructions, Cryotherapy, and Moist heat  PLAN FOR NEXT SESSION: Figure 8 for ankle. Analyze left ankle when stepping down from step  and without donning ankle foot orthosis brace. Gait Training in midstance to determine pain provocation and further deficits.    Toribio Servant PT, DPT  [facility] [address] [place], [address] Phone: [phone]   Fax:  [phone]

## 2024-10-11 ENCOUNTER — Ambulatory Visit: Admitting: Physical Therapy

## 2024-10-11 DIAGNOSIS — M25572 Pain in left ankle and joints of left foot: Secondary | ICD-10-CM | POA: Diagnosis not present

## 2024-10-11 DIAGNOSIS — M76822 Posterior tibial tendinitis, left leg: Secondary | ICD-10-CM

## 2024-10-11 NOTE — Therapy (Signed)
 OUTPATIENT PHYSICAL THERAPY LOWER EXTREMITY TREATMENT   Patient Name: [name] MRN: [id number] DOB:[birth year], 63 y.o., female Today's Date: 10/11/2024  END OF SESSION:  PT End of Session - 10/11/24 0955     Visit Number 3    Number of Visits 20    Date for Recertification  12/05/24    Authorization Type BCBS  2025    Authorization - Visit Number 3    Authorization - Number of Visits 20    Progress Note Due on Visit 10    PT Start Time 0945    PT Stop Time 1030    PT Time Calculation (min) 45 min    Activity Tolerance Patient limited by pain    Behavior During Therapy [facility] for tasks assessed/performed           Past Medical History:  Diagnosis Date   Complication of anesthesia    PONV (postoperative nausea and vomiting)    Past Surgical History:  Procedure Laterality Date   ANKLE ARTHROSCOPY WITH OPEN REDUCTION INTERNAL FIXATION (ORIF) Right 01/04/2024   AUGMENTATION MAMMAPLASTY Bilateral 2010   BICEPT TENODESIS Right 09/24/2021   Procedure: BICEPS TENODESIS;  Surgeon: Addie Cordella Hamilton, MD;  Location: [facility] OR;  Service: Orthopedics;  Laterality: Right;   CERVICAL SPINE SURGERY  2003   C5-C7- double dissectomy   PAROTID GLAND TUMOR EXCISION     SHOULDER ARTHROSCOPY Right 09/24/2021   Procedure: right shoulder arthroscopy, debridement,open rotator cuff tear repair;  Surgeon: Addie Cordella Hamilton, MD;  Location: [facility] OR;  Service: Orthopedics;  Laterality: Right;   WISDOM TOOTH EXTRACTION Bilateral    Patient Active Problem List   Diagnosis Date Noted   Elevated low density lipoprotein (LDL) cholesterol level 08/01/2023   Healthcare maintenance 07/31/2021   Congenital pes cavus 02/25/2021   History of uterine fibroid 01/05/2021   Herpes 07/12/2020   GERD (gastroesophageal reflux disease) 05/23/2020   Vaginal atrophy 05/23/2020   Cervical spine arthritis 05/23/2020   Osteopenia 05/23/2020   Insomnia 05/20/2020   Postmenopausal hormone therapy 05/20/2020     PCP: Dr. Jolene Cannady    REFERRING PROVIDER: Dr. Lonni Pae     REFERRING DIAG: [phone] (ICD-10-CM) - Left ankle pain  THERAPY DIAG:  Pain in left ankle and joints of left foot  Posterior tibial tendon dysfunction, left  Rationale for Evaluation and Treatment: Rehabilitation  ONSET DATE:  January 2025    SUBJECTIVE:   SUBJECTIVE STATEMENT: Pt states that her ankle is more swollen than usual and that she continues to wear her right ankle brace.   PERTINENT HISTORY: Pt is following up due to ongoing left ankle pain that is localized on the posterior tibial tendon that started after traumatic skiing accident that resulted in a pylon ankle fracture and need for extensive surgery. She has had to continue to utilize brace on left ankle due to severe pain when she is not using brace especially when walking for long periods of time or going down stairs or a curb. She notes increased swelling in the medial portion of left ankle that does not decrease. Pt just followed up with Dr. Pae who suggested that this ongoing pain could be from impingement of the posterior tibial tendon on hardware of left ankle and he referred her for additional physical therapy to see if this would help before suggesting further treatments.   PAIN:  Are you having pain? Yes: NPRS scale: 5-6/10 at worst when walking and going down stairs Pain location: Posterior tibial tendon portion  on medial malleolus of left ankle   Pain description: Sharp especially when weight bearing through left ankle   Aggravating factors: Walking and walking down stairs and  Relieving factors: Non-weight bearing positions like sitting    PRECAUTIONS: None  RED FLAGS: None   WEIGHT BEARING RESTRICTIONS: No  FALLS:  Has patient fallen in last 6 months? No  LIVING ENVIRONMENT: Lives with: lives with their spouse Lives in: House/apartment Stairs: Yes: External: 3 steps; on right going up Has following equipment at home:  Single point cane  OCCUPATION: Nurse    PLOF: Independent  PATIENT GOALS: To know how to address her left posterior tib pain with exercise.  NEXT MD VISIT: Nov 2025    OBJECTIVE:  Note: Objective measures were completed at Evaluation unless otherwise noted.  VITALS  BP 99/48 HR 89 SpO2 98   DIAGNOSTIC FINDINGS: CLINICAL DATA:  Ankle trauma, fracture, xray done (Age >= 5y)   EXAM: CT OF THE LEFT ANKLE WITHOUT CONTRAST   TECHNIQUE: Multidetector CT imaging of the left ankle was performed according to the standard protocol. Multiplanar CT image reconstructions were also generated.   RADIATION DOSE REDUCTION: This exam was performed according to the departmental dose-optimization program which includes automated exposure control, adjustment of the mA and/or kV according to patient size and/or use of iterative reconstruction technique.   COMPARISON:  None Available.   FINDINGS: Bones/Joint/Cartilage   Acute, complex trimalleolar fracture of the left ankle. Vertically oriented fracture through the base of the medial malleolus with mild medial displacement resulting in approximately 4 mm of articular-surface diastasis. Comminuted intra-articular fracture of the tibial plafond and with vertical extension into the distal metaphysis. Approximately 5 mm of articular-surface depression and diastasis. Transversely oriented fracture through the distal fibular metaphysis without significant displacement. Medial subluxation of the talar dome relative to the distal tibia without dislocation.   Bones of the hindfoot and midfoot are intact without fracture or malalignment. Joint spaces are preserved.   Ligaments   Suboptimally assessed by CT.   Muscles and Tendons   No acute musculotendinous abnormality by CT although position of the tibialis posterior tendon and flexor digitorum longus tendons could be at risk for entrapment.   Soft tissues   Soft tissue swelling with  ill-defined hematoma about the ankle, more pronounced medially. No soft tissue air to suggest open fracture.   IMPRESSION: 1. Acute, complex trimalleolar fracture of the left ankle, as described above. 2. Medial subluxation of the talar dome relative to the distal tibia without dislocation. 3. Soft tissue swelling with ill-defined hematoma about the ankle, more pronounced medially.  PATIENT SURVEYS:  Foot and Ankle Ability Measure (FAAM): 48/74 (57%)   COGNITION: Overall cognitive status: Within functional limits for tasks assessed     SENSATION: WFL  EDEMA:  Figure 8: NT   MUSCLE LENGTH: Not performed   Hamstrings: NT   Thomas test: NT    POSTURE: No Significant postural limitations  PALPATION: TTP left posterior tendon running along medial ankle with increased edema    LOWER EXTREMITY ROM:  Active ROM Right eval Left eval  Hip flexion    Hip extension    Hip abduction    Hip adduction    Hip internal rotation    Hip external rotation    Knee flexion    Knee extension    Ankle dorsiflexion 20 20  Ankle plantarflexion 50 50  Ankle inversion 35 35  Ankle eversion 15 15   (Blank rows = not tested)  LOWER EXTREMITY MMT:  MMT Right eval Left eval  Hip flexion    Hip extension    Hip abduction    Hip adduction    Hip internal rotation    Hip external rotation    Knee flexion    Knee extension    Ankle dorsiflexion 4 4  Ankle plantarflexion 4 4  Ankle inversion 4 4  Ankle eversion 4 4   (Blank rows = not tested)    FUNCTIONAL TESTS:  6 minute walk test: NT 10 mWT: NT    GAIT: Distance walked: 30 ft  Assistive device utilized: None Level of assistance: Complete Independence Comments: Decreased toe off on left foot with increased left ankle supination in standing and decreased pronation and dorsiflexion during midstance portion of gait.                                                                                                                                  TREATMENT DATE:   10/11/24: THEREX  - 1,000 ft  with use of ankle brace  -NRPS 7/10    Figure 4 Eccentric Ankle Inversion with yellow band 1 x 5 -Pt reports increased pain in medial side of ankle      Figure 4 Eccentric Ankle Inversion 1 x 10   -min VC to decrease speed of eccentric portion of exercise   Figure 4 Eccentric Ankle Inversion #1 AW 1 x 10   Figure 4 Eccentric Ankle Inversion #2 AW 1 x 10      NMR  Single Leg Stance on LLE 5 sec hold   Half tandem with LLE as posterior 30 sec    Half tandem with LLE as posterior horizontal head turns 2 x 10   Half tandem  with LLE as posterior vertical head turns 2 x 10   Half tandem with LLE as posterior eyes closed   Gait Training   Gait Analysis: Decreased pronation on LLE in stance and midstance and decreased dorisflexion   Staggered sit to stand with LLE as back foot 1 x 10   Partial Static Lunge with LLE as forward foot and 1 UE support 1 x 10    Standing Marches with ankle weight propping left ankle into increased pronation with BUE support 1 x 10    Standing Marches with ankle weight propping left ankle into increased pronation with 1 UE support 1 x 10      PATIENT EDUCATION:  Education details: Form and technique for correct performance of exercise and explanation about treatment plan for addressing posterior tib inflammation   Person educated: Patient Education method: Programmer, multimedia, Demonstration, Verbal cues, and Handouts Education comprehension: verbalized understanding, returned demonstration, and verbal cues required  HOME EXERCISE PROGRAM: Access Code: [email or web address] URL: [email or web address] Date: 10/11/2024 Prepared by: Toribio Servant  Exercises - Standing Calf Raise With Small Ball at Heels  - 3-4 x weekly - 3 sets - 10 reps - Seated  Figure 4 Ankle Inversion with Resistance (Mirrored)  - 3-4 x weekly - 3 sets - 10 reps - Step Taps on High Step  - 1 x daily - 7 x weekly  - 3 sets - 10 reps  ASSESSMENT:  CLINICAL IMPRESSION: Pt continues to be limited by left ankle pain along posterior tib location resulting in decreased walking tolerance. She also exhibits gait abnormalities like decreased dorsiflexion and pronation with LLE in stance phase. PT focused on low load eccentric to target likely posterior tib tendonitis. PT also focused on left ankle proprioception and gait training exercises to improve stability of left ankle as stance limb and to improve position of ankle in stance phase to increase dorsiflexion for increased LLE limb stability.  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    OBJECTIVE IMPAIRMENTS: Abnormal gait, decreased balance, decreased endurance, difficulty walking, hypomobility, increased edema, and pain.   ACTIVITY LIMITATIONS: carrying, lifting, standing, squatting, stairs, hygiene/grooming, and locomotion level  PARTICIPATION LIMITATIONS: shopping, community activity, and occupation  PERSONAL FACTORS: Time since onset of injury/illness/exacerbation are also affecting patient's functional outcome.   REHAB POTENTIAL: Fair severity of ankle surgery and chronicity of issue.   CLINICAL DECISION MAKING: Stable/uncomplicated  EVALUATION COMPLEXITY: Low   GOALS: Goals reviewed with patient? No  SHORT TERM GOALS: Target date: 10/10/2024   Patient will demonstrate undestanding of home exercise plan by performing exercises correctly with evidence of good carry over with min to no verbal or tactile cues .   Baseline: NT  Goal status: INITIAL    LONG TERM GOALS: Target date: 12/06/2023    Patient will demonstrate a minimally statistically significant difference in left ankle function as  evidenced by an improvement in foot and ankle ability measure (FAAM) by >=8% Lafonda 2005).  Baseline: 57% (48/74) Goal status: ONGOING    2.  Patient will show improved left ankle function with ability to descend stairs forward facing and without side stepping for >=13 steps.  Baseline: Needs to side step when descending stairs.  Goal status: ONGOING   3.  Patient will be able to ambulate >=1,000 ft in without exceeding ankle pain >=5-6/10 NRPS in medial portion of left ankle as evidence of improved left ankle function.  Baseline: 1,000 ft (7/10 NRPS)   Goal status: ONGOING   4.  Patient will demonstrate improved left ankle mechanics during midstance portion of gait cycle with increased ankle dorsiflexion and pronation as evidence of improved ankle function and ability to stabilize left knee for improved gait stability.  Baseline: Currently supinates left ankle in stance phase when compared to right ankle Goal status: ONGOING    5.  Patient will show improved left ankle tissue healing and function with figure 8 circumferential measurement that is within 10% of right ankle.   Baseline: NT  Goal status: ONGOING     PLAN:  PT FREQUENCY: 1-2x/week  PT DURATION: 10 weeks  PLANNED INTERVENTIONS: 97164- PT Re-evaluation, 97750- Physical Performance Testing, 97110-Therapeutic exercises, 97530- Therapeutic activity, [id number]- Neuromuscular re-education, 97535- Self Care, [id number]- Manual therapy, [id number]- Gait training, [phone]- Aquatic Therapy, [phone]- Splinting, [id number]- Electrical stimulation (unattended), [id number]- Electrical stimulation (manual), [id number]- Ultrasound, [id number]- Traction (mechanical), [id number] (1-2 muscles), [id number] (3+ muscles)- Dry Needling, Patient/Family education, Balance training, Stair  training, Taping, Joint mobilization, Joint manipulation, Spinal manipulation, Spinal mobilization, Scar mobilization, Compression bandaging, DME instructions, Cryotherapy, and Moist heat  PLAN FOR NEXT  SESSION: Figure 8 for ankle. Analyze left ankle when stepping down from step  and without donning ankle foot orthosis brace. Gait Training in midstance to determine pain provocation and further deficits.    Toribio Servant PT, DPT  [facility] [address] [place], [address] Phone: [phone]   Fax:  [phone]

## 2024-10-20 ENCOUNTER — Ambulatory Visit: Admitting: Physical Therapy

## 2024-10-20 DIAGNOSIS — M25572 Pain in left ankle and joints of left foot: Secondary | ICD-10-CM | POA: Diagnosis not present

## 2024-10-20 DIAGNOSIS — M76822 Posterior tibial tendinitis, left leg: Secondary | ICD-10-CM

## 2024-10-20 NOTE — Therapy (Signed)
 OUTPATIENT PHYSICAL THERAPY LOWER EXTREMITY TREATMENT   Patient Name: [name] MRN: [id number] DOB:June 12, 1961, 63 y.o., female Today's Date: 10/20/2024  END OF SESSION:  PT End of Session - 10/20/24 0909     Visit Number 4    Number of Visits 20    Date for Recertification  12/05/24    Authorization Type BCBS  2025    Authorization - Visit Number 4    Authorization - Number of Visits 20    Progress Note Due on Visit 10    PT Start Time 0905    PT Stop Time 0945    PT Time Calculation (min) 40 min    Activity Tolerance Patient limited by pain    Behavior During Therapy [facility] for tasks assessed/performed           Past Medical History:  Diagnosis Date   Complication of anesthesia    PONV (postoperative nausea and vomiting)    Past Surgical History:  Procedure Laterality Date   ANKLE ARTHROSCOPY WITH OPEN REDUCTION INTERNAL FIXATION (ORIF) Right 01/04/2024   AUGMENTATION MAMMAPLASTY Bilateral 2010   BICEPT TENODESIS Right 09/24/2021   Procedure: BICEPS TENODESIS;  Surgeon: Addie Cordella Hamilton, MD;  Location: [facility] OR;  Service: Orthopedics;  Laterality: Right;   CERVICAL SPINE SURGERY  2003   C5-C7- double dissectomy   PAROTID GLAND TUMOR EXCISION     SHOULDER ARTHROSCOPY Right 09/24/2021   Procedure: right shoulder arthroscopy, debridement,open rotator cuff tear repair;  Surgeon: Addie Cordella Hamilton, MD;  Location: [facility] OR;  Service: Orthopedics;  Laterality: Right;   WISDOM TOOTH EXTRACTION Bilateral    Patient Active Problem List   Diagnosis Date Noted   Elevated low density lipoprotein (LDL) cholesterol level 08/01/2023   Healthcare maintenance 07/31/2021   Congenital pes cavus 02/25/2021   History of uterine fibroid 01/05/2021   Herpes 07/12/2020   GERD (gastroesophageal reflux disease) 05/23/2020   Vaginal atrophy 05/23/2020   Cervical spine arthritis 05/23/2020   Osteopenia 05/23/2020   Insomnia 05/20/2020   Postmenopausal hormone therapy 05/20/2020     PCP: Dr. Jolene Cannady    REFERRING PROVIDER: Dr. Lonni Pae     REFERRING DIAG: [phone] (ICD-10-CM) - Left ankle pain  THERAPY DIAG:  Pain in left ankle and joints of left foot  Posterior tibial tendon dysfunction, left  Rationale for Evaluation and Treatment: Rehabilitation  ONSET DATE:  January 2025    SUBJECTIVE:   SUBJECTIVE STATEMENT: Pt reports increased pain in left ankle and she thinks it is due to rainy weather.    PERTINENT HISTORY: Pt is following up due to ongoing left ankle pain that is localized on the posterior tibial tendon that started after traumatic skiing accident that resulted in a pylon ankle fracture and need for extensive surgery. She has had to continue to utilize brace on left ankle due to severe pain when she is not using brace especially when walking for long periods of time or going down stairs or a curb. She notes increased swelling in the medial portion of left ankle that does not decrease. Pt just followed up with Dr. Pae who suggested that this ongoing pain could be from impingement of the posterior tibial tendon on hardware of left ankle and he referred her for additional physical therapy to see if this would help before suggesting further treatments.   PAIN:  Are you having pain? Yes: NPRS scale: 2-3/10 at worst when walking and going down stairs Pain location: Posterior tibial tendon portion on medial malleolus  of left ankle   Pain description: Sharp especially when weight bearing through left ankle   Aggravating factors: Walking and walking down stairs and  Relieving factors: Non-weight bearing positions like sitting    PRECAUTIONS: None  RED FLAGS: None   WEIGHT BEARING RESTRICTIONS: No  FALLS:  Has patient fallen in last 6 months? No  LIVING ENVIRONMENT: Lives with: lives with their spouse Lives in: House/apartment Stairs: Yes: External: 3 steps; on right going up Has following equipment at home: Single point  cane  OCCUPATION: Nurse    PLOF: Independent  PATIENT GOALS: To know how to address her left posterior tib pain with exercise.  NEXT MD VISIT: Nov 2025    OBJECTIVE:  Note: Objective measures were completed at Evaluation unless otherwise noted.  VITALS  BP 99/48 HR 89 SpO2 98   DIAGNOSTIC FINDINGS: CLINICAL DATA:  Ankle trauma, fracture, xray done (Age >= 5y)   EXAM: CT OF THE LEFT ANKLE WITHOUT CONTRAST   TECHNIQUE: Multidetector CT imaging of the left ankle was performed according to the standard protocol. Multiplanar CT image reconstructions were also generated.   RADIATION DOSE REDUCTION: This exam was performed according to the departmental dose-optimization program which includes automated exposure control, adjustment of the mA and/or kV according to patient size and/or use of iterative reconstruction technique.   COMPARISON:  None Available.   FINDINGS: Bones/Joint/Cartilage   Acute, complex trimalleolar fracture of the left ankle. Vertically oriented fracture through the base of the medial malleolus with mild medial displacement resulting in approximately 4 mm of articular-surface diastasis. Comminuted intra-articular fracture of the tibial plafond and with vertical extension into the distal metaphysis. Approximately 5 mm of articular-surface depression and diastasis. Transversely oriented fracture through the distal fibular metaphysis without significant displacement. Medial subluxation of the talar dome relative to the distal tibia without dislocation.   Bones of the hindfoot and midfoot are intact without fracture or malalignment. Joint spaces are preserved.   Ligaments   Suboptimally assessed by CT.   Muscles and Tendons   No acute musculotendinous abnormality by CT although position of the tibialis posterior tendon and flexor digitorum longus tendons could be at risk for entrapment.   Soft tissues   Soft tissue swelling with ill-defined  hematoma about the ankle, more pronounced medially. No soft tissue air to suggest open fracture.   IMPRESSION: 1. Acute, complex trimalleolar fracture of the left ankle, as described above. 2. Medial subluxation of the talar dome relative to the distal tibia without dislocation. 3. Soft tissue swelling with ill-defined hematoma about the ankle, more pronounced medially.  PATIENT SURVEYS:  Foot and Ankle Ability Measure (FAAM): 48/74 (57%)   COGNITION: Overall cognitive status: Within functional limits for tasks assessed     SENSATION: WFL  EDEMA:  Figure 8: NT   MUSCLE LENGTH: Not performed   Hamstrings: NT   Thomas test: NT    POSTURE: No Significant postural limitations  PALPATION: TTP left posterior tendon running along medial ankle with increased edema    LOWER EXTREMITY ROM:  Active ROM Right eval Left eval  Hip flexion    Hip extension    Hip abduction    Hip adduction    Hip internal rotation    Hip external rotation    Knee flexion    Knee extension    Ankle dorsiflexion 20 20  Ankle plantarflexion 50 50  Ankle inversion 35 35  Ankle eversion 15 15   (Blank rows = not tested)  LOWER EXTREMITY MMT:  MMT Right eval Left eval  Hip flexion    Hip extension    Hip abduction    Hip adduction    Hip internal rotation    Hip external rotation    Knee flexion    Knee extension    Ankle dorsiflexion 4 4  Ankle plantarflexion 4 4  Ankle inversion 4 4  Ankle eversion 4 4   (Blank rows = not tested)    FUNCTIONAL TESTS:  6 minute walk test: NT 10 mWT: NT    GAIT: Distance walked: 30 ft  Assistive device utilized: None Level of assistance: Complete Independence Comments: Decreased toe off on left foot with increased left ankle supination in standing and decreased pronation and dorsiflexion during midstance portion of gait.                                                                                                                                  TREATMENT DATE:   10/20/24: THEREX  Figure 8 ankle measurement R/L 20/20 Eccentric Posterior Tib  Wall Fall on single 2 x 10  -min VC to use one finger support to decrease   GAIT TRAINING  10 ft x 20 without shoes   -Decreased toe off on LLE   -min VC to increase toe off   10 ft x 20 with shoes one    -Improved toe off from last session    NMR: LLE is always posterior stance foot     Semi-tandem eyes open 10 sec hold -mild sway      Semi-tandem eyes open horizontal head turns 2 x 10   -mild sway Semi-tandem eyes open  vertical head turns 2 x 10   PATIENT EDUCATION:  Education details: Form and technique for correct performance of exercise and explanation about treatment plan for addressing posterior tib inflammation   Person educated: Patient Education method: Explanation, Demonstration, Verbal cues, and Handouts Education comprehension: verbalized understanding, returned demonstration, and verbal cues required  HOME EXERCISE PROGRAM: Access Code: [email or web address] URL: [email or web address] Date: 10/20/2024 Prepared by: Toribio Servant  Program Notes Wall fall on Left foot 2 x 10  x 3-4 days per week   Exercises - Step Taps on High Step  - 1 x daily - 7 x weekly - 3 sets - 10 reps - Half Tandem Stance Balance with Head Nods (Mirrored)  - 1 x daily - 7 x weekly - 2 sets - 10 reps - Heel-Toe Walking  - 1 x daily - 7 x weekly - 1 sets - 10 reps  ASSESSMENT:  CLINICAL IMPRESSION: Pt shows improved left stance ankle position with decreased supination and increased toe off. She does continue to be limited by pain especially after prolonged standing activities especially left ankle single leg stance. PT focused on balance and gait training to improve proprioception of left ankle and gait mechanics in context of gait. 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     OBJECTIVE IMPAIRMENTS: Abnormal gait, decreased balance, decreased endurance, difficulty walking, hypomobility, increased edema, and pain.   ACTIVITY LIMITATIONS: carrying, lifting, standing, squatting, stairs, hygiene/grooming, and locomotion level  PARTICIPATION LIMITATIONS: shopping, community activity, and occupation  PERSONAL FACTORS: Time since onset of injury/illness/exacerbation are also affecting patient's functional outcome.   REHAB POTENTIAL: Fair severity of ankle surgery and chronicity of issue.   CLINICAL DECISION MAKING: Stable/uncomplicated  EVALUATION COMPLEXITY: Low   GOALS: Goals reviewed with patient? No  SHORT TERM GOALS: Target date: 10/10/2024   Patient will demonstrate undestanding of home exercise plan by performing exercises correctly with evidence of good carry over with min to no verbal or tactile cues .   Baseline: NT 10/10/24: Performing independently  Goal status: ACHIEVED      LONG TERM GOALS: Target date: 12/06/2023    Patient will demonstrate a minimally statistically significant difference in left ankle function as evidenced by an improvement in foot and ankle ability measure (FAAM) by >=8% [place] 2005).  Baseline: 57% (48/74) Goal status: ONGOING    2.  Patient will show improved left ankle function with ability to descend stairs forward facing and without side stepping for >=13 steps.  Baseline: Needs to side step when descending stairs.  Goal status: ONGOING   3.  Patient will be able to ambulate >=1,000 ft in without exceeding ankle pain >=5-6/10 NRPS in medial portion of left ankle as evidence of improved left ankle function.  Baseline: 1,000 ft (7/10 NRPS)   Goal status: ONGOING   4.  Patient will demonstrate improved left ankle mechanics during midstance portion of  gait cycle with increased ankle dorsiflexion and pronation as evidence of improved ankle function and ability to stabilize left knee for improved gait stability.  Baseline: Currently supinates left ankle in stance phase when compared to right ankle Goal status: ONGOING    5.  Patient will show improved left ankle tissue healing and function with figure 8 circumferential measurement that is within 10% of right ankle.   Baseline: NT 10/20/24: Figure 8 R/L 20/20 Goal status: DEFERRED      PLAN:  PT FREQUENCY: 1-2x/week  PT DURATION: 10 weeks  PLANNED INTERVENTIONS: 97164- PT Re-evaluation, 97750- Physical Performance Testing, 97110-Therapeutic exercises, 97530- Therapeutic activity, [id number]- Neuromuscular re-education, 97535- Self Care, [id number]- Manual therapy, [id number]- Gait training, [phone]- Aquatic Therapy, [phone]- Splinting, H9716- Electrical stimulation (unattended), [phone]- Electrical stimulation (manual), [id number]- Ultrasound, [id number]- Traction (mechanical), 20560 (1-2 muscles), 20561 (3+ muscles)- Dry Needling, Patient/Family education, Balance training, Stair training, Taping, Joint mobilization, Joint manipulation, Spinal manipulation, Spinal mobilization, Scar mobilization, Compression bandaging, DME instructions, Cryotherapy, and Moist heat  PLAN FOR NEXT SESSION: Ongoing training of single leg balance and introduce dynamic balance on LLE.   Toribio Servant PT, DPT  [facility] [address], [place], [address] Phone: [phone]   Fax:  [phone]

## 2024-10-24 ENCOUNTER — Ambulatory Visit: Attending: Orthopaedic Surgery

## 2024-10-24 ENCOUNTER — Encounter: Payer: Self-pay | Admitting: Physical Therapy

## 2024-10-24 DIAGNOSIS — M76822 Posterior tibial tendinitis, left leg: Secondary | ICD-10-CM | POA: Insufficient documentation

## 2024-10-24 DIAGNOSIS — M25572 Pain in left ankle and joints of left foot: Secondary | ICD-10-CM | POA: Insufficient documentation

## 2024-10-24 NOTE — Therapy (Signed)
 OUTPATIENT PHYSICAL THERAPY LOWER EXTREMITY TREATMENT   Patient Name: Davyn Elsasser MRN: [id number] DOB:April 01, 1961, 63 y.o., female Today's Date: 10/24/2024  END OF SESSION:  PT End of Session - 10/24/24 1604     Visit Number 5    Number of Visits 20    Date for Recertification  12/05/24    Authorization Type BCBS  2025    Authorization - Number of Visits 20    Progress Note Due on Visit 10    PT Start Time 1603    PT Stop Time 1642    PT Time Calculation (min) 39 min    Activity Tolerance Patient limited by pain    Behavior During Therapy [facility] for tasks assessed/performed            Past Medical History:  Diagnosis Date   Complication of anesthesia    PONV (postoperative nausea and vomiting)    Past Surgical History:  Procedure Laterality Date   ANKLE ARTHROSCOPY WITH OPEN REDUCTION INTERNAL FIXATION (ORIF) Right 01/04/2024   AUGMENTATION MAMMAPLASTY Bilateral 2010   BICEPT TENODESIS Right 09/24/2021   Procedure: BICEPS TENODESIS;  Surgeon: Addie Cordella Hamilton, MD;  Location: [facility] OR;  Service: Orthopedics;  Laterality: Right;   CERVICAL SPINE SURGERY  2003   C5-C7- double dissectomy   PAROTID GLAND TUMOR EXCISION     SHOULDER ARTHROSCOPY Right 09/24/2021   Procedure: right shoulder arthroscopy, debridement,open rotator cuff tear repair;  Surgeon: Addie Cordella Hamilton, MD;  Location: [facility] OR;  Service: Orthopedics;  Laterality: Right;   WISDOM TOOTH EXTRACTION Bilateral    Patient Active Problem List   Diagnosis Date Noted   Elevated low density lipoprotein (LDL) cholesterol level 08/01/2023   Healthcare maintenance 07/31/2021   Congenital pes cavus 02/25/2021   History of uterine fibroid 01/05/2021   Herpes 07/12/2020   GERD (gastroesophageal reflux disease) 05/23/2020   Vaginal atrophy 05/23/2020   Cervical spine arthritis 05/23/2020   Osteopenia 05/23/2020   Insomnia 05/20/2020   Postmenopausal hormone therapy 05/20/2020    PCP: Dr. Jolene Cannady     REFERRING PROVIDER: Dr. Lonni Pae     REFERRING DIAG: [phone] (ICD-10-CM) - Left ankle pain  THERAPY DIAG:  Pain in left ankle and joints of left foot  Posterior tibial tendon dysfunction, left  Rationale for Evaluation and Treatment: Rehabilitation  ONSET DATE:  January 2025    SUBJECTIVE:   SUBJECTIVE STATEMENT:   Patient reports increased pain in L ankle due to being up on it all day.  PERTINENT HISTORY: Pt is following up due to ongoing left ankle pain that is localized on the posterior tibial tendon that started after traumatic skiing accident that resulted in a pylon ankle fracture and need for extensive surgery. She has had to continue to utilize brace on left ankle due to severe pain when she is not using brace especially when walking for long periods of time or going down stairs or a curb. She notes increased swelling in the medial portion of left ankle that does not decrease. Pt just followed up with Dr. Pae who suggested that this ongoing pain could be from impingement of the posterior tibial tendon on hardware of left ankle and he referred her for additional physical therapy to see if this would help before suggesting further treatments.   PAIN:  Are you having pain? Yes: NPRS scale: 2-3/10 at worst when walking and going down stairs Pain location: Posterior tibial tendon portion on medial malleolus of left ankle   Pain description: [place]  especially when weight bearing through left ankle   Aggravating factors: Walking and walking down stairs and  Relieving factors: Non-weight bearing positions like sitting    PRECAUTIONS: None  RED FLAGS: None   WEIGHT BEARING RESTRICTIONS: No  FALLS:  Has patient fallen in last 6 months? No  LIVING ENVIRONMENT: Lives with: lives with their spouse Lives in: House/apartment Stairs: Yes: External: 3 steps; on right going up Has following equipment at home: Single point cane  OCCUPATION: Nurse    PLOF:  Independent  PATIENT GOALS: To know how to address her left posterior tib pain with exercise.  NEXT MD VISIT: Nov 2025    OBJECTIVE:  Note: Objective measures were completed at Evaluation unless otherwise noted.  VITALS  BP 99/48 HR 89 SpO2 98   DIAGNOSTIC FINDINGS: CLINICAL DATA:  Ankle trauma, fracture, xray done (Age >= 5y)   EXAM: CT OF THE LEFT ANKLE WITHOUT CONTRAST   TECHNIQUE: Multidetector CT imaging of the left ankle was performed according to the standard protocol. Multiplanar CT image reconstructions were also generated.   RADIATION DOSE REDUCTION: This exam was performed according to the departmental dose-optimization program which includes automated exposure control, adjustment of the mA and/or kV according to patient size and/or use of iterative reconstruction technique.   COMPARISON:  None Available.   FINDINGS: Bones/Joint/Cartilage   Acute, complex trimalleolar fracture of the left ankle. Vertically oriented fracture through the base of the medial malleolus with mild medial displacement resulting in approximately 4 mm of articular-surface diastasis. Comminuted intra-articular fracture of the tibial plafond and with vertical extension into the distal metaphysis. Approximately 5 mm of articular-surface depression and diastasis. Transversely oriented fracture through the distal fibular metaphysis without significant displacement. Medial subluxation of the talar dome relative to the distal tibia without dislocation.   Bones of the hindfoot and midfoot are intact without fracture or malalignment. Joint spaces are preserved.   Ligaments   Suboptimally assessed by CT.   Muscles and Tendons   No acute musculotendinous abnormality by CT although position of the tibialis posterior tendon and flexor digitorum longus tendons could be at risk for entrapment.   Soft tissues   Soft tissue swelling with ill-defined hematoma about the ankle, more pronounced  medially. No soft tissue air to suggest open fracture.   IMPRESSION: 1. Acute, complex trimalleolar fracture of the left ankle, as described above. 2. Medial subluxation of the talar dome relative to the distal tibia without dislocation. 3. Soft tissue swelling with ill-defined hematoma about the ankle, more pronounced medially.  PATIENT SURVEYS:  Foot and Ankle Ability Measure (FAAM): 48/74 (57%)   COGNITION: Overall cognitive status: Within functional limits for tasks assessed     SENSATION: WFL  EDEMA:  Figure 8: NT   MUSCLE LENGTH: Not performed   Hamstrings: NT   Thomas test: NT    POSTURE: No Significant postural limitations  PALPATION: TTP left posterior tendon running along medial ankle with increased edema    LOWER EXTREMITY ROM:  Active ROM Right eval Left eval  Hip flexion    Hip extension    Hip abduction    Hip adduction    Hip internal rotation    Hip external rotation    Knee flexion    Knee extension    Ankle dorsiflexion 20 20  Ankle plantarflexion 50 50  Ankle inversion 35 35  Ankle eversion 15 15   (Blank rows = not tested)       LOWER EXTREMITY MMT:  MMT  Right eval Left eval  Hip flexion    Hip extension    Hip abduction    Hip adduction    Hip internal rotation    Hip external rotation    Knee flexion    Knee extension    Ankle dorsiflexion 4 4  Ankle plantarflexion 4 4  Ankle inversion 4 4  Ankle eversion 4 4   (Blank rows = not tested)    FUNCTIONAL TESTS:  6 minute walk test: NT 10 mWT: NT    GAIT: Distance walked: 30 ft  Assistive device utilized: None Level of assistance: Complete Independence Comments: Decreased toe off on left foot with increased left ankle supination in standing and decreased pronation and dorsiflexion during midstance portion of gait.                                                                                                                                 TREATMENT DATE:   10/24/24:   Stair negotiation 2 x 4 steps with reciprocal pattern. Cues for L ankle DF on descent  Gastroc stretch on L with foot propped on step x 30 seconds Tandem stance with L foot on airex and R foot on 6 step 2 x 30 seconds with no UE support Sumo squat x 10 - encourage ankle DF  Heel raises with B feet internally rotated x 10 with single UE support   Updated HEP with below exercises   10/20/24: THEREX  Figure 8 ankle measurement R/L 20/20 Eccentric Posterior Tib  Wall Fall on single 2 x 10  -min VC to use one finger support to decrease   GAIT TRAINING  10 ft x 20 without shoes   -Decreased toe off on LLE   -min VC to increase toe off   10 ft x 20 with shoes one    -Improved toe off from last session    NMR: LLE is always posterior stance foot     Semi-tandem eyes open 10 sec hold -mild sway      Semi-tandem eyes open horizontal head turns 2 x 10   -mild sway Semi-tandem eyes open  vertical head turns 2 x 10   PATIENT EDUCATION:  Education details: Form and technique for correct performance of exercise and explanation about treatment plan for addressing posterior tib inflammation   Person educated: Patient Education method: Explanation, Demonstration, Verbal cues, and Handouts Education comprehension: verbalized understanding, returned demonstration, and verbal cues required  HOME EXERCISE PROGRAM: Access Code: [email or web address] URL: [email or web address] Date: 10/24/2024 Prepared by: Maryanne Finder  Program Notes Wall fall on Left foot 2 x 10  x 3-4 days per week   Exercises - Step Taps on High Step  - 1 x daily - 7 x weekly - 3 sets - 10 reps - Half Tandem Stance Balance with Head Nods (Mirrored)  - 1 x daily - 7 x weekly - 2 sets - 10 reps - Heel-Toe Walking  -  1 x daily - 7 x weekly - 1 sets - 10 reps - Standing Heel Raise with Toes Turned Out  - 1 x daily - 5-7 x weekly - 3 sets - 10 reps - Standing Heel Raise with Toes Turned In  - 1 x daily - 5-7  x weekly - 3 sets - 10 reps - Sumo Squat with Dumbbell  - 1 x daily - 5-7 x weekly - 3 sets - 10 reps - Seated Ankle Inversion with Resistance and Legs Crossed  - 1 x daily - 5-7 x weekly - 3 sets - 10 reps - Seated Toe Towel Scrunches  - 1 x daily - 5-7 x weekly - 3 sets - 10 reps  ASSESSMENT:  CLINICAL IMPRESSION:   Continued PT POC focused on L ankle pain following surgery earlier in the year. Session focused on balance, stair negotiation, and posterior tibialis strengthening. Updated HEP with focus on strengthening as patient taking slight break from PT for a couple of weeks. 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     OBJECTIVE IMPAIRMENTS: Abnormal gait, decreased balance, decreased endurance, difficulty walking, hypomobility, increased edema, and pain.   ACTIVITY LIMITATIONS: carrying, lifting, standing, squatting, stairs, hygiene/grooming, and locomotion level  PARTICIPATION LIMITATIONS: shopping, community activity, and occupation  PERSONAL FACTORS: Time since onset of injury/illness/exacerbation are also affecting patient's functional outcome.   REHAB POTENTIAL: Fair severity of ankle surgery and chronicity of issue.   CLINICAL DECISION MAKING: Stable/uncomplicated  EVALUATION COMPLEXITY: Low   GOALS: Goals reviewed with patient? No  SHORT TERM GOALS: Target date: 10/10/2024   Patient will demonstrate undestanding of home exercise plan by performing exercises correctly with evidence of good carry over with min to no verbal or tactile cues .   Baseline: NT 10/10/24: Performing independently  Goal status: ACHIEVED      LONG TERM GOALS: Target date: 12/06/2023    Patient will demonstrate a minimally statistically  significant difference in left ankle function as evidenced by an improvement in foot and ankle ability measure (FAAM) by >=8% [place] 2005).  Baseline: 57% (48/74) Goal status: ONGOING    2.  Patient will show improved left ankle function with ability to descend stairs forward facing and without side stepping for >=13 steps.  Baseline: Needs to side step when descending stairs.  Goal status: ONGOING   3.  Patient will be able to ambulate >=1,000 ft in without exceeding ankle pain >=5-6/10 NRPS in medial portion of left ankle as evidence of improved left ankle function.  Baseline: 1,000 ft (7/10 NRPS)   Goal status: ONGOING   4.  Patient will demonstrate improved left ankle mechanics during midstance portion of gait cycle with increased ankle dorsiflexion and pronation as evidence of improved ankle function and ability to stabilize left knee for improved gait stability.  Baseline: Currently supinates left ankle in stance phase when compared to right ankle Goal status: ONGOING    5.  Patient will show improved left ankle tissue healing and function with figure 8 circumferential measurement that is within 10% of right ankle.   Baseline: NT 10/20/24: Figure 8 R/L 20/20 Goal status: DEFERRED      PLAN:  PT FREQUENCY: 1-2x/week  PT DURATION: 10 weeks  PLANNED  INTERVENTIONS: 97164- PT Re-evaluation, 97750- Physical Performance Testing, 97110-Therapeutic exercises, 97530- Therapeutic activity, [id number]- Neuromuscular re-education, 97535- Self Care, [id number]- Manual therapy, [phone]- Gait training, [phone]- Aquatic Therapy, [phone]- Splinting, [phone]- Electrical stimulation (unattended), [phone]- Electrical stimulation (manual), [id number]- Ultrasound, [id number]- Traction (mechanical), [phone] (1-2 muscles), [phone] (3+ muscles)- Dry Needling, Patient/Family education, Balance training, Stair training, Taping, Joint mobilization, Joint manipulation, Spinal manipulation, Spinal mobilization, Scar mobilization,  Compression bandaging, DME instructions, Cryotherapy, and Moist heat  PLAN FOR NEXT SESSION: Ongoing training of single leg balance and introduce dynamic balance on LLE.   Maryanne Finder, PT, DPT Physical Therapist - [place]  [facility]

## 2024-11-07 DIAGNOSIS — T8484XA Pain due to internal orthopedic prosthetic devices, implants and grafts, initial encounter: Secondary | ICD-10-CM | POA: Diagnosis not present

## 2024-11-07 DIAGNOSIS — M25572 Pain in left ankle and joints of left foot: Secondary | ICD-10-CM | POA: Diagnosis not present

## 2024-11-07 DIAGNOSIS — G8929 Other chronic pain: Secondary | ICD-10-CM | POA: Diagnosis not present

## 2024-12-05 ENCOUNTER — Encounter: Payer: Self-pay | Admitting: Dermatology

## 2024-12-05 ENCOUNTER — Ambulatory Visit: Admitting: Dermatology

## 2024-12-05 ENCOUNTER — Encounter: Payer: Self-pay | Admitting: Internal Medicine

## 2024-12-05 DIAGNOSIS — L853 Xerosis cutis: Secondary | ICD-10-CM

## 2024-12-05 DIAGNOSIS — Z808 Family history of malignant neoplasm of other organs or systems: Secondary | ICD-10-CM | POA: Diagnosis not present

## 2024-12-05 DIAGNOSIS — Z85828 Personal history of other malignant neoplasm of skin: Secondary | ICD-10-CM

## 2024-12-05 NOTE — Progress Notes (Signed)
° °  Follow-Up Visit   Subjective  Noemie Devivo is a 63 y.o. female who presents for the following:  Spot on her the middle of her lower lip, hx of SCC in that area 6 yrs ago in [place] . She has had blue light treatment in the past. No pain.  Family hx Melanoma, sister on her back.   The following portions of the chart were reviewed this encounter and updated as appropriate: medications, allergies, medical history  Review of Systems:  No other skin or systemic complaints except as noted in HPI or Assessment and Plan.  Objective  Well appearing patient in no apparent distress; mood and affect are within normal limits.   A focused examination was performed of the following areas: Face, lip  Relevant exam findings are noted in the Assessment and Plan.    Assessment & Plan   HISTORY OF SQUAMOUS CELL CARCINOMA OF THE SKIN Right lower lip  Patient reports had 6 years ago - treated with Blue light and chemo cream in [place]    Exam: 1 mm scaly thin papule on central lower vermilion lip  Plan: - No evidence of recurrence today - focal lesion may be stratum corneum. If it grows, bleeds, changes then return for biopsy - recheck at follow up - Recommend regular full body skin exams - Call if any new or changing lesions are noted between office visits   FAMILY HISTORY OF SKIN CANCER What type(s):melanoma, bcc  Who affected:sister treated years ago  Lip xerosis Exam: dryness of lips  Treatment Plan: Recommend moisturizer such as cerave then Aquaphor BID or more PRN  XEROSIS CUTIS   HISTORY OF SCC (SQUAMOUS CELL CARCINOMA) OF SKIN    Return for TBSE, as scheduled, with Dr. Claudene, HxSCCis.  LILLETTE Lonell Drones, RMA, am acting as scribe for Boneta Claudene, MD .   Documentation: I have reviewed the above documentation for accuracy and completeness, and I agree with the above.  Boneta Claudene, MD

## 2024-12-05 NOTE — Patient Instructions (Signed)

## 2024-12-06 ENCOUNTER — Other Ambulatory Visit: Payer: Self-pay

## 2024-12-06 ENCOUNTER — Ambulatory Visit: Admitting: Certified Registered"

## 2024-12-06 ENCOUNTER — Encounter: Admission: RE | Disposition: A | Payer: Self-pay | Source: Home / Self Care | Attending: Internal Medicine

## 2024-12-06 ENCOUNTER — Ambulatory Visit: Admitting: Physical Therapy

## 2024-12-06 ENCOUNTER — Encounter: Payer: Self-pay | Admitting: Internal Medicine

## 2024-12-06 ENCOUNTER — Ambulatory Visit
Admission: RE | Admit: 2024-12-06 | Discharge: 2024-12-06 | Disposition: A | Attending: Internal Medicine | Admitting: Internal Medicine

## 2024-12-06 DIAGNOSIS — K635 Polyp of colon: Secondary | ICD-10-CM | POA: Diagnosis not present

## 2024-12-06 DIAGNOSIS — K5730 Diverticulosis of large intestine without perforation or abscess without bleeding: Secondary | ICD-10-CM | POA: Diagnosis not present

## 2024-12-06 DIAGNOSIS — D123 Benign neoplasm of transverse colon: Secondary | ICD-10-CM | POA: Insufficient documentation

## 2024-12-06 DIAGNOSIS — Z79899 Other long term (current) drug therapy: Secondary | ICD-10-CM | POA: Diagnosis not present

## 2024-12-06 DIAGNOSIS — K640 First degree hemorrhoids: Secondary | ICD-10-CM | POA: Diagnosis not present

## 2024-12-06 DIAGNOSIS — Z83719 Family history of colon polyps, unspecified: Secondary | ICD-10-CM | POA: Diagnosis not present

## 2024-12-06 DIAGNOSIS — Z1211 Encounter for screening for malignant neoplasm of colon: Secondary | ICD-10-CM | POA: Diagnosis not present

## 2024-12-06 DIAGNOSIS — K219 Gastro-esophageal reflux disease without esophagitis: Secondary | ICD-10-CM | POA: Diagnosis not present

## 2024-12-06 DIAGNOSIS — K648 Other hemorrhoids: Secondary | ICD-10-CM | POA: Diagnosis not present

## 2024-12-06 HISTORY — PX: COLONOSCOPY: SHX5424

## 2024-12-06 HISTORY — PX: POLYPECTOMY: SHX149

## 2024-12-06 SURGERY — COLONOSCOPY
Anesthesia: General

## 2024-12-06 MED ORDER — PHENYLEPHRINE 80 MCG/ML (10ML) SYRINGE FOR IV PUSH (FOR BLOOD PRESSURE SUPPORT)
PREFILLED_SYRINGE | INTRAVENOUS | Status: DC | PRN
  Administered 2024-12-06 (×2): 80 ug via INTRAVENOUS
  Administered 2024-12-06: 09:00:00 160 ug via INTRAVENOUS
  Administered 2024-12-06 (×2): 80 ug via INTRAVENOUS

## 2024-12-06 MED ORDER — LIDOCAINE HCL (PF) 2 % IJ SOLN
INTRAMUSCULAR | Status: AC
  Filled 2024-12-06: qty 5

## 2024-12-06 MED ORDER — LIDOCAINE HCL (CARDIAC) PF 100 MG/5ML IV SOSY
PREFILLED_SYRINGE | INTRAVENOUS | Status: DC | PRN
  Administered 2024-12-06: 09:00:00 100 mg via INTRAVENOUS

## 2024-12-06 MED ORDER — PHENYLEPHRINE 80 MCG/ML (10ML) SYRINGE FOR IV PUSH (FOR BLOOD PRESSURE SUPPORT)
PREFILLED_SYRINGE | INTRAVENOUS | Status: AC
  Filled 2024-12-06: qty 10

## 2024-12-06 MED ORDER — PROPOFOL 500 MG/50ML IV EMUL
INTRAVENOUS | Status: DC | PRN
  Administered 2024-12-06: 09:00:00 90 mg via INTRAVENOUS
  Administered 2024-12-06: 09:00:00 150 ug/kg/min via INTRAVENOUS

## 2024-12-06 MED ORDER — EPHEDRINE SULFATE-NACL 50-0.9 MG/10ML-% IV SOSY
PREFILLED_SYRINGE | INTRAVENOUS | Status: DC | PRN
  Administered 2024-12-06: 09:00:00 5 mg via INTRAVENOUS

## 2024-12-06 MED ORDER — SODIUM CHLORIDE 0.9 % IV SOLN: INTRAVENOUS | Status: DC

## 2024-12-06 MED ORDER — PROPOFOL 10 MG/ML IV BOLUS
INTRAVENOUS | Status: AC
  Filled 2024-12-06: qty 40

## 2024-12-06 NOTE — H&P (Signed)
 Outpatient short stay form Pre-procedure 12/06/2024 8:16 AM [name], M.D.  Primary Physician: Jolene Cannady, FNP  Reason for visit:    Diagnoses  Colon cancer screening  Family history of colonic polyps      History of present illness:   63 year old patient presenting for family history of colon polyps. Patient denies any change in bowel habits, rectal bleeding or involuntary weight loss.    Current Medications[1]  Medications Prior to Admission  Medication Sig Dispense Refill Last Dose/Taking   celecoxib  (CELEBREX ) 200 MG capsule Take 200 mg by mouth 2 (two) times daily.   Taking   Biotin 5 MG CAPS Take 5 mg by mouth daily.      CALCIUM PO Take 800 mg by mouth daily.      Cholecalciferol 50 MCG (2000 UT) CAPS Take 2,000 Units by mouth daily.      esomeprazole (NEXIUM) 20 MG capsule Take 20 mg by mouth daily.      Estradiol  (YUVAFEM ) 10 MCG TABS vaginal tablet Place 1 tablet (10 mcg total) vaginally twice a week 18 tablet 0    Melatonin 5 MG CAPS Take 5 mg by mouth at bedtime.      Probiotic Product (ALIGN PO) Take 1 tablet by mouth daily.      progesterone  (PROMETRIUM ) 200 MG capsule Take 1 capsule by mouth at bedtime.      testosterone  (ANDROGEL ) 50 MG/5GM (1%) GEL Apply 0.5cc daily in syringe to upper outer or inner thigh, at least an hour after shaving.      traMADol  (ULTRAM ) 50 MG tablet Take 25 mg by mouth every 6 (six) hours as needed for moderate pain (pain score 4-6). (Patient not taking: Reported on 09/26/2024)      traZODone  (DESYREL ) 50 MG tablet TAKE 2 TABLETS BY MOUTH AT  BEDTIME 180 tablet 3    valACYclovir  (VALTREX ) 500 MG tablet Take 2 (two) tablets by mouth once daily for total of 5 days as needed for outbreaks. 60 tablet 4      Allergies[2]   Past Medical History:  Diagnosis Date   Arthritis    Cervical spine arthritis    Complication of anesthesia    GERD (gastroesophageal reflux disease)    PONV (postoperative nausea and vomiting)      Review of systems:  Otherwise negative.    Physical Exam  Gen: Alert, oriented. Appears stated age.  HEENT: [place]/AT. PERRLA. Lungs: CTA, no wheezes. CV: RR nl S1, S2. Abd: soft, benign, no masses. BS+ Ext: No edema. Pulses 2+    Planned procedures: Proceed with colonoscopy. The patient understands the nature of the planned procedure, indications, risks, alternatives and potential complications including but not limited to bleeding, infection, perforation, damage to internal organs and possible oversedation/side effects from anesthesia. The patient agrees and gives consent to proceed.  Please refer to procedure notes for findings, recommendations and patient disposition/instructions.     Pandora Mccrackin K. Young, M.D. Gastroenterology 12/06/2024  8:16 AM          [1]  Current Facility-Administered Medications:    0.9 %  sodium chloride  infusion, , Intravenous, Continuous, [place], Natalynn Pedone K, MD, Last Rate: 20 mL/hr at 12/06/24 0806, Continued from Pre-op at 12/06/24 0806 [2]  Allergies Allergen Reactions   Cyclobenzaprine Other (See Comments)    Altered mental state  cyclobenzaprine   Pertussis Vaccines Swelling    Questionable

## 2024-12-06 NOTE — Anesthesia Preprocedure Evaluation (Signed)
 Anesthesia Evaluation  Patient identified by MRN, date of birth, ID band Patient awake    Reviewed: Allergy & Precautions, NPO status , Patient's Chart, lab work & pertinent test results  History of Anesthesia Complications (+) PONV and history of anesthetic complications  Airway Mallampati: III  TM Distance: <3 FB Neck ROM: full    Dental  (+) Chipped   Pulmonary neg pulmonary ROS, neg shortness of breath   Pulmonary exam normal        Cardiovascular Exercise Tolerance: Good negative cardio ROS Normal cardiovascular exam     Neuro/Psych negative neurological ROS  negative psych ROS   GI/Hepatic Neg liver ROS,GERD  Controlled,,  Endo/Other  negative endocrine ROS    Renal/GU negative Renal ROS  negative genitourinary   Musculoskeletal   Abdominal   Peds  Hematology negative hematology ROS (+)   Anesthesia Other Findings Past Medical History: No date: Arthritis No date: Cervical spine arthritis No date: Complication of anesthesia No date: GERD (gastroesophageal reflux disease) No date: PONV (postoperative nausea and vomiting)  Past Surgical History: 01/04/2024: ANKLE ARTHROSCOPY WITH OPEN REDUCTION INTERNAL FIXATION  (ORIF); Right 2010: AUGMENTATION MAMMAPLASTY; Bilateral 09/24/2021: BICEPT TENODESIS; Right     Comment:  Procedure: BICEPS TENODESIS;  Surgeon: Addie Cordella Hamilton, MD;  Location: [facility] OR;  Service: Orthopedics;                Laterality: Right; 2003: CERVICAL SPINE SURGERY     Comment:  C5-C7- double dissectomy No date: DILATION AND CURETTAGE OF UTERUS No date: PAROTID GLAND TUMOR EXCISION 09/24/2021: SHOULDER ARTHROSCOPY; Right     Comment:  Procedure: right shoulder arthroscopy, debridement,open               rotator cuff tear repair;  Surgeon: Addie Cordella Hamilton,               MD;  Location: [facility] OR;  Service: Orthopedics;  Laterality:              Right; No date: WISDOM  TOOTH EXTRACTION; Bilateral  BMI    Body Mass Index: 26.25 kg/m      Reproductive/Obstetrics negative OB ROS                              Anesthesia Physical Anesthesia Plan  ASA: 2  Anesthesia Plan: General   Post-op Pain Management:    Induction: Intravenous  PONV Risk Score and Plan: Propofol  infusion and TIVA  Airway Management Planned: Natural Airway and Nasal Cannula  Additional Equipment:   Intra-op Plan:   Post-operative Plan:   Informed Consent: I have reviewed the patients History and Physical, chart, labs and discussed the procedure including the risks, benefits and alternatives for the proposed anesthesia with the patient or authorized representative who has indicated his/her understanding and acceptance.     Dental Advisory Given  Plan Discussed with: Anesthesiologist, CRNA and Surgeon  Anesthesia Plan Comments: (Patient consented for risks of anesthesia including but not limited to:  - adverse reactions to medications - risk of airway placement if required - damage to eyes, teeth, lips or other oral mucosa - nerve damage due to positioning  - sore throat or hoarseness - Damage to heart, brain, nerves, lungs, other parts of body or loss of life  Patient voiced understanding and assent.)        Anesthesia Quick  Evaluation

## 2024-12-06 NOTE — Op Note (Signed)
 [facility] Gastroenterology Patient Name: Rayen Palen Procedure Date: 12/06/2024 8:30 AM MRN: [id number] Account #: [id number] Date of Birth: 1961-11-24 Admit Type: Outpatient Age: 63 Room: [facility] ENDO ROOM 3 Gender: Female Note Status: Finalized Instrument Name: Colon Scope [id number] Procedure:             Colonoscopy Indications:           Screening for colorectal malignant neoplasm, Family                         history of colon polyps (mother). Providers:             Michaelle Bottomley K. Aundria MD, MD Referring MD:          Melanie DASEN. Cannady (Referring MD) Medicines:             Propofol  per Anesthesia Complications:         No immediate complications. Estimated blood loss:                         Minimal. Procedure:             Pre-Anesthesia Assessment:                        - The risks and benefits of the procedure and the                         sedation options and risks were discussed with the                         patient. All questions were answered and informed                         consent was obtained.                        - Patient identification and proposed procedure were                         verified prior to the procedure by the nurse. The                         procedure was verified in the procedure room.                        - ASA Grade Assessment: II - A patient with mild                         systemic disease.                        - After reviewing the risks and benefits, the patient                         was deemed in satisfactory condition to undergo the                         procedure.                        After obtaining informed consent,  the colonoscope was                         passed under direct vision. Throughout the procedure,                         the patient's blood pressure, pulse, and oxygen                         saturations were monitored continuously. The                         Colonoscope was  introduced through the anus and                         advanced to the the cecum, identified by appendiceal                         orifice and ileocecal valve. The colonoscopy was                         performed without difficulty. The patient tolerated                         the procedure well. The quality of the bowel                         preparation was good. The ileocecal valve, appendiceal                         orifice, and rectum were photographed. Findings:      The perianal and digital rectal examinations were normal. Pertinent       negatives include normal sphincter tone and no palpable rectal lesions.      Non-bleeding internal hemorrhoids were found during retroflexion. The       hemorrhoids were Grade I (internal hemorrhoids that do not prolapse).      Many medium-mouthed and small-mouthed diverticula were found in the       sigmoid colon.      A 4 mm polyp was found in the splenic flexure. The polyp was sessile.       The polyp was removed with a cold snare. Resection and retrieval were       complete. Estimated blood loss was minimal.      The exam was otherwise without abnormality. Impression:            - Non-bleeding internal hemorrhoids.                        - Diverticulosis in the sigmoid colon.                        - One 4 mm polyp at the splenic flexure, removed with                         a cold snare. Resected and retrieved.                        - The examination was otherwise normal. Recommendation:        -  Patient has a contact number available for                         emergencies. The signs and symptoms of potential                         delayed complications were discussed with the patient.                         Return to normal activities tomorrow. Written                         discharge instructions were provided to the patient.                        - Resume previous diet.                        - Continue present medications.                         - Repeat colonoscopy is recommended for surveillance.                         The colonoscopy date will be determined after                         pathology results from today's exam become available                         for review.                        - Return to GI office PRN.                        - The findings and recommendations were discussed with                         the patient. Procedure Code(s):     --- Professional ---                        [phone], Colonoscopy, flexible; with removal of                         tumor(s), polyp(s), or other lesion(s) by snare                         technique Diagnosis Code(s):     --- Professional ---                        K57.30, Diverticulosis of large intestine without                         perforation or abscess without bleeding                        D12.3, Benign neoplasm of transverse colon (hepatic  flexure or splenic flexure)                        K64.0, First degree hemorrhoids                        Z12.11, Encounter for screening for malignant neoplasm                         of colon CPT copyright 2022 American Medical Association. All rights reserved. The codes documented in this report are preliminary and upon coder review may  be revised to meet current compliance requirements. Ladell MARLA Boss MD, MD 12/06/2024 8:55:14 AM This report has been signed electronically. Number of Addenda: 0 Note Initiated On: 12/06/2024 8:30 AM Scope Withdrawal Time: 0 hours 7 minutes 44 seconds  Total Procedure Duration: 0 hours 11 minutes 8 seconds  Estimated Blood Loss:  Estimated blood loss was minimal.      [facility]

## 2024-12-06 NOTE — Interval H&P Note (Signed)
 History and Physical Interval Note:  12/06/2024 8:16 AM  [name]  has presented today for surgery, with the diagnosis of Colon cancer screening (Z12.11) Family history of colonic polyps (Z83.719).  The various methods of treatment have been discussed with the patient and family. After consideration of risks, benefits and other options for treatment, the patient has consented to  Procedures: COLONOSCOPY (N/A) as a surgical intervention.  The patient's history has been reviewed, patient examined, no change in status, stable for surgery.  I have reviewed the patient's chart and labs.  Questions were answered to the patient's satisfaction.     [name]

## 2024-12-06 NOTE — Anesthesia Postprocedure Evaluation (Signed)
 Anesthesia Post Note  Patient: [name]  Procedure(s) Performed: COLONOSCOPY POLYPECTOMY, INTESTINE  Patient location during evaluation: Endoscopy Anesthesia Type: General Level of consciousness: awake and alert Pain management: pain level controlled Vital Signs Assessment: post-procedure vital signs reviewed and stable Respiratory status: spontaneous breathing, nonlabored ventilation and respiratory function stable Cardiovascular status: blood pressure returned to baseline and stable Postop Assessment: no apparent nausea or vomiting Anesthetic complications: no   No notable events documented.   Last Vitals:  Vitals:   12/06/24 0912 12/06/24 0919  BP: (!) 89/60 111/67  Pulse: 82   Resp: 16   Temp:    SpO2: 100%     Last Pain:  Vitals:   12/06/24 0912  TempSrc:   PainSc: 0-No pain                 [name] POUR [name]

## 2024-12-06 NOTE — Transfer of Care (Signed)
 Immediate Anesthesia Transfer of Care Note  Patient: [name]  Procedure(s) Performed: COLONOSCOPY POLYPECTOMY, INTESTINE  Patient Location: PACU  Anesthesia Type:General  Level of Consciousness: awake, alert , and oriented  Airway & Oxygen Therapy: Patient Spontanous Breathing  Post-op Assessment: Report given to RN and Post -op Vital signs reviewed and stable  Post vital signs: Reviewed and stable  Last Vitals:  Vitals Value Taken Time  BP 104/54 12/06/24 08:57  Temp 35.9 C 12/06/24 08:52  Pulse 82 12/06/24 08:58  Resp 16 12/06/24 08:58  SpO2 100 % 12/06/24 08:58  Vitals shown include unfiled device data.  Last Pain:  Vitals:   12/06/24 0852  TempSrc: Temporal  PainSc: 0-No pain         Complications: No notable events documented.

## 2024-12-07 LAB — SURGICAL PATHOLOGY

## 2024-12-18 DIAGNOSIS — S29012A Strain of muscle and tendon of back wall of thorax, initial encounter: Secondary | ICD-10-CM | POA: Diagnosis not present

## 2025-01-13 ENCOUNTER — Telehealth: Admitting: Physician Assistant

## 2025-01-13 DIAGNOSIS — R051 Acute cough: Secondary | ICD-10-CM

## 2025-01-13 DIAGNOSIS — R6889 Other general symptoms and signs: Secondary | ICD-10-CM | POA: Diagnosis not present

## 2025-01-13 MED ORDER — PROMETHAZINE HCL 25 MG PO TABS: 25.0000 mg | ORAL_TABLET | Freq: Three times a day (TID) | ORAL | 0 refills | Status: DC | PRN

## 2025-01-13 MED ORDER — PROMETHAZINE-DM 6.25-15 MG/5ML PO SYRP: 5.0000 mL | ORAL_SOLUTION | Freq: Four times a day (QID) | ORAL | 0 refills | Status: AC | PRN

## 2025-01-13 MED ORDER — ALBUTEROL SULFATE HFA 108 (90 BASE) MCG/ACT IN AERS: 2.0000 | INHALATION_SPRAY | Freq: Four times a day (QID) | RESPIRATORY_TRACT | 0 refills | Status: AC | PRN

## 2025-01-13 MED ORDER — OSELTAMIVIR PHOSPHATE 75 MG PO CAPS: 75.0000 mg | ORAL_CAPSULE | Freq: Two times a day (BID) | ORAL | 0 refills | Status: AC

## 2025-01-13 NOTE — Patient Instructions (Signed)
 " [name], thank you for joining Emma Velma Lunger, PA-C for today's virtual visit.  While this provider is not your primary care provider (PCP), if your PCP is located in our provider database this encounter information will be shared with them immediately following your visit.   A [employer] MyChart account gives you access to today's visit and all your visits, tests, and labs performed at [facility]  click here if you don't have a [facility] MyChart account or go to [email or web address]  Consent: (Patient) [name] provided verbal consent for this virtual visit at the beginning of the encounter.  Current Medications:  Current Outpatient Medications:    albuterol  (VENTOLIN  HFA) 108 (90 Base) MCG/ACT inhaler, Inhale 2 puffs into the lungs every 6 (six) hours as needed for wheezing or shortness of breath., Disp: 8 g, Rfl: 0   oseltamivir (TAMIFLU) 75 MG capsule, Take 1 capsule (75 mg total) by mouth 2 (two) times daily for 5 days., Disp: 10 capsule, Rfl: 0   promethazine -dextromethorphan (PROMETHAZINE -DM) 6.25-15 MG/5ML syrup, Take 5 mLs by mouth 4 (four) times daily as needed for cough., Disp: 118 mL, Rfl: 0   Biotin 5 MG CAPS, Take 5 mg by mouth daily., Disp: , Rfl:    CALCIUM PO, Take 800 mg by mouth daily., Disp: , Rfl:    celecoxib  (CELEBREX ) 200 MG capsule, Take 200 mg by mouth 2 (two) times daily., Disp: , Rfl:    Cholecalciferol 50 MCG (2000 UT) CAPS, Take 2,000 Units by mouth daily., Disp: , Rfl:    esomeprazole (NEXIUM) 20 MG capsule, Take 20 mg by mouth daily., Disp: , Rfl:    Estradiol  (YUVAFEM ) 10 MCG TABS vaginal tablet, Place 1 tablet (10 mcg total) vaginally twice a week, Disp: 18 tablet, Rfl: 0   Melatonin 5 MG CAPS, Take 5 mg by mouth at bedtime., Disp: , Rfl:    progesterone  (PROMETRIUM ) 200 MG capsule, Take 1 capsule by mouth at bedtime., Disp: , Rfl:    testosterone  (ANDROGEL ) 50 MG/5GM (1%) GEL, Apply 0.5cc daily in syringe to upper outer  or inner thigh, at least an hour after shaving., Disp: , Rfl:    traMADol  (ULTRAM ) 50 MG tablet, Take 25 mg by mouth every 6 (six) hours as needed for moderate pain (pain score 4-6). (Patient not taking: Reported on 09/26/2024), Disp: , Rfl:    traZODone  (DESYREL ) 50 MG tablet, TAKE 2 TABLETS BY MOUTH AT  BEDTIME, Disp: 180 tablet, Rfl: 3   valACYclovir  (VALTREX ) 500 MG tablet, Take 2 (two) tablets by mouth once daily for total of 5 days as needed for outbreaks., Disp: 60 tablet, Rfl: 4   Medications ordered in this encounter:  Meds ordered this encounter  Medications   oseltamivir (TAMIFLU) 75 MG capsule    Sig: Take 1 capsule (75 mg total) by mouth 2 (two) times daily for 5 days.    Dispense:  10 capsule    Refill:  0    Supervising Provider:   LAMPTEY, PHILIP O [[id number]]   DISCONTD: promethazine  (PHENERGAN ) 25 MG tablet    Sig: Take 1 tablet (25 mg total) by mouth every 8 (eight) hours as needed for nausea or vomiting.    Dispense:  20 tablet    Refill:  0    Supervising Provider:   LAMPTEY, PHILIP O [[id number]]   albuterol  (VENTOLIN  HFA) 108 (90 Base) MCG/ACT inhaler    Sig: Inhale 2 puffs into the lungs every 6 (six) hours as needed for  wheezing or shortness of breath.    Dispense:  8 g    Refill:  0    Supervising Provider:   BLAISE ALEENE KIDD [[id number]]   promethazine -dextromethorphan (PROMETHAZINE -DM) 6.25-15 MG/5ML syrup    Sig: Take 5 mLs by mouth 4 (four) times daily as needed for cough.    Dispense:  118 mL    Refill:  0    D/C PREVIOUS SCRIPTS FOR PROMETHAZINE     Supervising Provider:   BLAISE ALEENE KIDD [[id number]]     *If you need refills on other medications prior to your next appointment, please contact your pharmacy*  Follow-Up: Call back or seek an in-person evaluation if the symptoms worsen or if the condition fails to improve as anticipated.  [facility] [phone]  Other Instructions Please keep well-hydrated and try to get plenty of rest. If  you have a humidifier, place it in the bedroom and run it at night. Start a saline nasal rinse for nasal congestion. You can consider use of a nasal steroid spray like Flonase  or Nasacort OTC. You can alternate between Tylenol  and Ibuprofen  if needed for fever, body aches, headache and/or throat pain. Salt water-gargles and chloraseptic spray can be very beneficial for sore throat. Mucinex-DM for congestion or cough. Please take all prescribed medications as directed.  Remain out of work until [employer] for 24 hours without a fever-reducing medication, and you are feeling better.  You should mask until symptoms are resolved.  If anything worsens despite treatment, you need to be evaluated in-person. Please do not delay care.  Influenza, Adult Influenza is also called the flu. It is an infection in the lungs, nose, and throat (respiratory tract). It spreads easily from person to person (is contagious). The flu causes symptoms that are like a cold, along with high fever and body aches. What are the causes? This condition is caused by the influenza virus. You can get the virus by: Breathing in droplets that are in the air after a person infected with the flu coughed or sneezed. Touching something that has the virus on it and then touching your mouth, nose, or eyes. What increases the risk? Certain things may make you more likely to get the flu. These include: Not washing your hands often. Having close contact with many people during cold and flu season. Touching your mouth, eyes, or nose without first washing your hands. Not getting a flu shot every year. You may have a higher risk for the flu, and serious problems, such as a lung infection (pneumonia), if you: Are older than 65. Are pregnant. Have a weakened disease-fighting system (immune system) because of a disease or because you are taking certain medicines. Have a long-term (chronic) condition, such as: Heart, kidney, or lung  disease. Diabetes. Asthma. Have a liver disorder. Are very overweight (morbidly obese). Have anemia. What are the signs or symptoms? Symptoms usually begin suddenly and last 4-14 days. They may include: Fever and chills. Headaches, body aches, or muscle aches. Sore throat. Cough. Runny or stuffy (congested) nose. Feeling discomfort in your chest. Not wanting to eat as much as normal. Feeling weak or tired. Feeling dizzy. Feeling sick to your stomach or throwing up. How is this treated? If the flu is found early, you can be treated with antiviral medicine. This can help to reduce how bad the illness is and how long it lasts. This may be given by mouth or through an IV tube. Taking care of yourself at home can  help your symptoms get better. Your doctor may want you to: Take over-the-counter medicines. Drink plenty of fluids. The flu often goes away on its own. If you have very bad symptoms or other problems, you may be treated in a hospital. Follow these instructions at home:     Activity Rest as needed. Get plenty of sleep. Stay home from work or school as told by your doctor. Do not leave home until you do not have a fever for 24 hours without taking medicine. Leave home only to go to your doctor. Eating and drinking Take an ORS (oral rehydration solution). This is a drink that is sold at pharmacies and stores. Drink enough fluid to keep your pee pale yellow. Drink clear fluids in small amounts as you are able. Clear fluids include: Water. Ice chips. Fruit juice mixed with water. Low-calorie sports drinks. Eat bland foods that are easy to digest. Eat small amounts as you are able. These foods include: Bananas. Applesauce. Rice. Lean meats. Toast. Crackers. Do not eat or drink: Fluids that have a lot of sugar or caffeine. Alcohol. Spicy or fatty foods. General instructions Take over-the-counter and prescription medicines only as told by your doctor. Use a cool  mist humidifier to add moisture to the air in your home. This can make it easier for you to breathe. When using a cool mist humidifier, clean it daily. Empty water and replace with clean water. Cover your mouth and nose when you cough or sneeze. Wash your hands with soap and water often and for at least 20 seconds. This is also important after you cough or sneeze. If you cannot use soap and water, use alcohol-based hand sanitizer. Keep all follow-up visits. How is this prevented?  Get a flu shot every year. You may get the flu shot in late summer, fall, or winter. Ask your doctor when you should get your flu shot. Avoid contact with people who are sick during fall and winter. This is cold and flu season. Contact a doctor if: You get new symptoms. You have: Chest pain. Watery poop (diarrhea). A fever. Your cough gets worse. You start to have more mucus. You feel sick to your stomach. You throw up. Get help right away if you: Have shortness of breath. Have trouble breathing. Have skin or nails that turn a bluish color. Have very bad pain or stiffness in your neck. Get a sudden headache. Get sudden pain in your face or ear. Cannot eat or drink without throwing up. These symptoms may represent a serious problem that is an emergency. Get medical help right away. Call your local emergency services (911 in the [place] Do not wait to see if the symptoms will go away. Do not drive yourself to the hospital. Summary Influenza is also called the flu. It is an infection in the lungs, nose, and throat. It spreads easily from person to person. Take over-the-counter and prescription medicines only as told by your doctor. Getting a flu shot every year is the best way to not get the flu. This information is not intended to replace advice given to you by your health care provider. Make sure you discuss any questions you have with your health care provider. Document Revised: 07/27/2020 Document  Reviewed: 07/27/2020 Elsevier Patient Education  2023 [employer]      If you have been instructed to have an in-person evaluation today at a local Urgent Care facility, please use the link below. It will take you to a list of  all of our available [facility], including address, phone number and hours of operation. Please do not delay care.  [facility]  If you or a family member do not have a primary care provider, use the link below to schedule a visit and establish care. When you choose a [facility] primary care physician or advanced practice provider, you gain a long-term partner in health. Find a Primary Care Provider  Learn more about [facility] in-office and virtual care options: [facility] - Get Care Now  "

## 2025-01-13 NOTE — Progress Notes (Signed)
 " Virtual Visit Consent   [name], you are scheduled for a virtual visit with a [facility] provider today. Just as with appointments in the office, your consent must be obtained to participate. Your consent will be active for this visit and any virtual visit you may have with one of our providers in the next 365 days. If you have a MyChart account, a copy of this consent can be sent to you electronically.  As this is a virtual visit, video technology does not allow for your provider to perform a traditional examination. This may limit your provider's ability to fully assess your condition. If your provider identifies any concerns that need to be evaluated in person or the need to arrange testing (such as labs, EKG, etc.), we will make arrangements to do so. Although advances in technology are sophisticated, we cannot ensure that it will always work on either your end or our end. If the connection with a video visit is poor, the visit may have to be switched to a telephone visit. With either a video or telephone visit, we are not always able to ensure that we have a secure connection.  By engaging in this virtual visit, you consent to the provision of healthcare and authorize for your insurance to be billed (if applicable) for the services provided during this visit. Depending on your insurance coverage, you may receive a charge related to this service.  I need to obtain your verbal consent now. Are you willing to proceed with your visit today? [name] has provided verbal consent on 01/13/2025 for a virtual visit (video or telephone). [name], [place]  Date: 01/13/2025 9:37 AM   Virtual Visit via Video Note   I, [name], connected with  Finnley Lewis  ([id number], 24-Apr-1961) on 01/13/25 at  9:30 AM EST by a video-enabled telemedicine application and verified that I am speaking with the correct person using two identifiers.  Location: Patient: Virtual Visit Location  Patient: Home Provider: Virtual Visit Location Provider: Home Office   I discussed the limitations of evaluation and management by telemedicine and the availability of in person appointments. The patient expressed understanding and agreed to proceed.    History of Present Illness: [name] is a 64 y.o. who identifies as a female who was assigned female at birth, and is being seen today overnight onset of body aches, sore throat, congestion, fatigue. This morning with hoarseness, cough and chest congestion. Notes able to cough phlegm up this morning. Notes low-grade fever but is taking her Celebrex  which could be masking higher fever. Denies recent travel or known sick contact. 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  Now noting some wheezing.   OTC -- Celebrex .  HPI: HPI  Problems:  Patient Active Problem List   Diagnosis Date Noted   Elevated low density lipoprotein (LDL) cholesterol level 08/01/2023   Healthcare maintenance 07/31/2021   Congenital pes cavus 02/25/2021   History of uterine fibroid 01/05/2021   Herpes 07/12/2020   GERD (gastroesophageal reflux disease) 05/23/2020   Vaginal atrophy 05/23/2020   Cervical spine arthritis 05/23/2020   Osteopenia 05/23/2020   Insomnia 05/20/2020   Postmenopausal hormone therapy 05/20/2020    Allergies: Allergies[1] Medications: Current Medications[2]  Observations/Objective: Patient is well-developed, well-nourished in no acute distress.  Resting comfortably  at home.  Head is normocephalic, atraumatic.  No labored breathing.  Speech is clear and coherent with logical content.  Patient is alert and oriented at baseline.  Assessment and Plan: 1. Flu-like symptoms (Primary) - oseltamivir (TAMIFLU) 75 MG capsule; Take 1 capsule (75 mg total) by mouth 2 (two) times daily for 5 days.  Dispense: 10 capsule; Refill: 0 - albuterol  (VENTOLIN  HFA) 108 (90 Base) MCG/ACT inhaler; Inhale 2 puffs into the lungs every 6 (six) hours as needed  for wheezing or shortness of breath.  Dispense: 8 g; Refill: 0 - promethazine -dextromethorphan (PROMETHAZINE -DM) 6.25-15 MG/5ML syrup; Take 5 mLs by mouth 4 (four) times daily as needed for cough.  Dispense: 118 mL; Refill: 0  Classic influenza symptoms. Known exposure. Supportive measures, OTC medications and Vitamin recommendations reviewed. Will start Tamiflu per orders. Promethazine -DM per orders. Quarantine reviewed with patient.    Follow Up Instructions: I discussed the assessment and treatment plan with the patient. The patient was provided an opportunity to ask questions and all were answered. The patient agreed with the plan and demonstrated an understanding of the instructions.  A copy of instructions were sent to the patient via MyChart unless otherwise noted below.    The patient was advised to call back or seek an in-person evaluation if the symptoms worsen or if the condition fails to improve as anticipated.    Emma Velma Lunger, PA-C    [1]  Allergies Allergen Reactions   Cyclobenzaprine Other (See Comments)    Altered mental state  cyclobenzaprine   Pertussis Vaccines Swelling    Questionable  [2]  Current Outpatient Medications:    albuterol  (VENTOLIN  HFA) 108 (90 Base) MCG/ACT inhaler, Inhale 2 puffs into the lungs every 6 (six) hours as needed for wheezing or shortness of breath., Disp: 8 g, Rfl: 0   oseltamivir (TAMIFLU) 75 MG capsule, Take 1 capsule (75 mg total) by mouth 2 (two) times daily for 5 days., Disp: 10 capsule, Rfl: 0   promethazine -dextromethorphan (PROMETHAZINE -DM) 6.25-15 MG/5ML syrup, Take 5 mLs by mouth 4 (four) times daily as needed for cough., Disp: 118 mL, Rfl: 0   Biotin 5 MG CAPS, Take 5 mg by mouth daily., Disp: , Rfl:    CALCIUM PO, Take 800 mg by mouth daily., Disp: , Rfl:    celecoxib  (CELEBREX ) 200 MG capsule, Take 200 mg by mouth 2 (two) times daily., Disp: , Rfl:    Cholecalciferol 50 MCG (2000 UT) CAPS, Take 2,000 Units by mouth  daily., Disp: , Rfl:    esomeprazole (NEXIUM) 20 MG capsule, Take 20 mg by mouth daily., Disp: , Rfl:    Estradiol  (YUVAFEM ) 10 MCG TABS vaginal tablet, Place 1 tablet (10 mcg total) vaginally twice a week, Disp: 18 tablet, Rfl: 0   Melatonin 5 MG CAPS, Take 5 mg by mouth at bedtime., Disp: , Rfl:    progesterone  (PROMETRIUM ) 200 MG capsule, Take 1 capsule by mouth at bedtime., Disp: , Rfl:    testosterone  (ANDROGEL ) 50 MG/5GM (1%) GEL, Apply 0.5cc daily in syringe to upper outer or inner thigh, at least an hour after shaving., Disp: , Rfl:    traMADol  (ULTRAM ) 50 MG tablet, Take 25 mg by mouth every 6 (six) hours as needed for moderate pain (pain score 4-6). (Patient not taking: Reported on 09/26/2024), Disp: , Rfl:    traZODone  (DESYREL ) 50 MG tablet, TAKE 2 TABLETS BY MOUTH AT  BEDTIME, Disp: 180 tablet, Rfl: 3   valACYclovir  (VALTREX ) 500 MG tablet, Take 2 (two) tablets by mouth once daily for total of 5 days as needed for outbreaks., Disp: 60 tablet, Rfl: 4  "

## 2025-01-17 ENCOUNTER — Telehealth: Payer: Self-pay

## 2025-01-17 NOTE — Telephone Encounter (Signed)
 Copied from CRM #[id number]. Topic: Clinical - Medical Advice >> Jan 13, 2025 12:33 PM Gattis SQUIBB wrote: Reason for CRM: Pt did test positive for flu A.  FYI

## 2025-01-19 ENCOUNTER — Telehealth: Admitting: Physician Assistant

## 2025-01-19 ENCOUNTER — Ambulatory Visit: Payer: Self-pay

## 2025-01-19 DIAGNOSIS — J040 Acute laryngitis: Secondary | ICD-10-CM | POA: Diagnosis not present

## 2025-01-19 MED ORDER — METHYLPREDNISOLONE 4 MG PO TBPK: ORAL_TABLET | ORAL | 0 refills | Status: AC

## 2025-01-19 NOTE — Telephone Encounter (Signed)
 Scheduled

## 2025-01-19 NOTE — Patient Instructions (Signed)
 " Reggy Passer, thank you for joining Lovette Borg, PA-C for today's virtual visit.  While this provider is not your primary care provider (PCP), if your PCP is located in our provider database this encounter information will be shared with them immediately following your visit.   A [facility] MyChart account gives you access to today's visit and all your visits, tests, and labs performed at [facility]  click here if you don't have a [facility] MyChart account or go to [email or web address]  Consent: (Patient) [name] provided verbal consent for this virtual visit at the beginning of the encounter.  Current Medications:  Current Outpatient Medications:    methylPREDNISolone  (MEDROL  DOSEPAK) 4 MG TBPK tablet, Take as directed, Disp: 1 each, Rfl: 0   albuterol  (VENTOLIN  HFA) 108 (90 Base) MCG/ACT inhaler, Inhale 2 puffs into the lungs every 6 (six) hours as needed for wheezing or shortness of breath., Disp: 8 g, Rfl: 0   Biotin 5 MG CAPS, Take 5 mg by mouth daily., Disp: , Rfl:    CALCIUM PO, Take 800 mg by mouth daily., Disp: , Rfl:    celecoxib  (CELEBREX ) 200 MG capsule, Take 200 mg by mouth 2 (two) times daily., Disp: , Rfl:    Cholecalciferol 50 MCG (2000 UT) CAPS, Take 2,000 Units by mouth daily., Disp: , Rfl:    esomeprazole (NEXIUM) 20 MG capsule, Take 20 mg by mouth daily., Disp: , Rfl:    Estradiol  (YUVAFEM ) 10 MCG TABS vaginal tablet, Place 1 tablet (10 mcg total) vaginally twice a week, Disp: 18 tablet, Rfl: 0   Melatonin 5 MG CAPS, Take 5 mg by mouth at bedtime., Disp: , Rfl:    progesterone  (PROMETRIUM ) 200 MG capsule, Take 1 capsule by mouth at bedtime., Disp: , Rfl:    promethazine -dextromethorphan (PROMETHAZINE -DM) 6.25-15 MG/5ML syrup, Take 5 mLs by mouth 4 (four) times daily as needed for cough., Disp: 118 mL, Rfl: 0   testosterone  (ANDROGEL ) 50 MG/5GM (1%) GEL, Apply 0.5cc daily in syringe to upper outer or inner thigh, at least an hour after shaving.,  Disp: , Rfl:    traMADol  (ULTRAM ) 50 MG tablet, Take 25 mg by mouth every 6 (six) hours as needed for moderate pain (pain score 4-6). (Patient not taking: Reported on 09/26/2024), Disp: , Rfl:    traZODone  (DESYREL ) 50 MG tablet, TAKE 2 TABLETS BY MOUTH AT  BEDTIME, Disp: 180 tablet, Rfl: 3   valACYclovir  (VALTREX ) 500 MG tablet, Take 2 (two) tablets by mouth once daily for total of 5 days as needed for outbreaks., Disp: 60 tablet, Rfl: 4   Medications ordered in this encounter:  Meds ordered this encounter  Medications   methylPREDNISolone  (MEDROL  DOSEPAK) 4 MG TBPK tablet    Sig: Take as directed    Dispense:  1 each    Refill:  0    Supervising Provider:   BLAISE ALEENE KIDD [[id number]]     *If you need refills on other medications prior to your next appointment, please contact your pharmacy*  Follow-Up: Call back or seek an in-person evaluation if the symptoms worsen or if the condition fails to improve as anticipated.     Other Instructions Stay well hydrated with clear liquids Start medicine as prescribed.  Continue to watch for worsening symptoms. Schedule a virtual appointment or follow up at an urgent care clinic if symptoms don't improve.    If you have been instructed to have an in-person evaluation today at a local Urgent Care facility, please  use the link below. It will take you to a list of all of our available [facility], including address, phone number and hours of operation. Please do not delay care.  [facility]  If you or a family member do not have a primary care provider, use the link below to schedule a visit and establish care. When you choose a [facility] primary care physician or advanced practice provider, you gain a long-term partner in health. Find a Primary Care Provider  Learn more about [facility]'s in-office and virtual care options:  - Get Care Now  "

## 2025-01-19 NOTE — Telephone Encounter (Signed)
 FYI Only or Action Required?: Action required by provider: clinical question for provider.  Patient was last seen in primary care on 01/13/2025 by [name], PA-C.  Called Nurse Triage reporting Laryngitis.  Symptoms began a week ago.  Interventions attempted: OTC medications: Dayquil,Flonase , finished Tamiflu .  Symptoms are: unchanged.Asking for a steroid, has hoarseness and still coughing. Please advise. May leave a message.  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 DESCRIPTION: Describe your voice. (e.g., coarse, raspy, weaker, airy, scratchy, deeper)     Weak, raspy 2. SEVERITY: How bad is it?     severe 3. ONSET: When did the hoarseness begin?     This week 4. COUGH: Is there a cough? If Yes, ask: How bad is it?     yes 5. FEVER: Do you have a fever? If Yes, ask: What is your temperature, how was it measured, and when did it start?     no 6. ALLERGIES: Do you have any allergy symptoms? If Yes, ask: What are they? (e.g., nose stuffiness)     no 7. IRRITANTS: Do you smoke? Have you been exposed to any irritating fumes? (e.g., smoke)     no 8. CAUSE: What do you think is causing the hoarseness?     cough 9. OTHER SYMPTOMS: Do you have any other symptoms? (e.g., breathing difficulty, fever, foreign body, lymph node swelling in neck, rash, sore throat, swallowing difficulty, weight loss)     Sore throat 10. PREGNANCY: Is there any chance you are pregnant? When was your last menstrual period?       no  Protocols used: [facility]

## 2025-01-19 NOTE — Telephone Encounter (Signed)
 1st attempt to reach patient, no answer-left voicemail; Called 2x  Copied from CRM [id number]. Topic: Clinical - Medical Advice >> Jan 19, 2025  2:32 PM [name] wrote:  Pt recovering from FLU A but has now lost her voice. Husband is calling in to ask if there is anything she can be prescribed. They are also ok with a virtual appointment if needed

## 2025-01-19 NOTE — Progress Notes (Signed)
 " Virtual Visit Consent   [name], you are scheduled for a virtual visit with a [facility] provider today. Just as with appointments in the office, your consent must be obtained to participate. Your consent will be active for this visit and any virtual visit you may have with one of our providers in the next 365 days. If you have a MyChart account, a copy of this consent can be sent to you electronically.  As this is a virtual visit, video technology does not allow for your provider to perform a traditional examination. This may limit your provider's ability to fully assess your condition. If your provider identifies any concerns that need to be evaluated in person or the need to arrange testing (such as labs, EKG, etc.), we will make arrangements to do so. Although advances in technology are sophisticated, we cannot ensure that it will always work on either your end or our end. If the connection with a video visit is poor, the visit may have to be switched to a telephone visit. With either a video or telephone visit, we are not always able to ensure that we have a secure connection.  By engaging in this virtual visit, you consent to the provision of healthcare and authorize for your insurance to be billed (if applicable) for the services provided during this visit. Depending on your insurance coverage, you may receive a charge related to this service.  I need to obtain your verbal consent now. Are you willing to proceed with your visit today? [name] has provided verbal consent on 01/19/2025 for a virtual visit (video or telephone). [name], [place]  Date: 01/19/2025 5:51 PM   Virtual Visit via Video Note   I, [name], connected with  [name]  ([id number], [id number]) on 01/19/25 at  6:30 PM EST by a video-enabled telemedicine application and verified that I am speaking with the correct person using two identifiers.  Location: Patient: Virtual Visit Location Patient:  Home Provider: Virtual Visit Location Provider: Home Office   I discussed the limitations of evaluation and management by telemedicine and the availability of in person appointments. The patient expressed understanding and agreed to proceed.    History of Present Illness: [name] is a 64 y.o. who identifies as a female who was assigned female at birth, and is being seen today for loss of voice, URI symptoms.  HPI: 64y/o F presents for a telehealth video visit for c/o  cough, chest tightness, and loss of voice for the past 2 days. She was treated with Tamilfu which she finished recently this week. She denies fever, CP, or SOB. Denies pain with deep breaths.   URI     Problems:  Patient Active Problem List   Diagnosis Date Noted   Elevated low density lipoprotein (LDL) cholesterol level 08/01/2023   Healthcare maintenance 07/31/2021   Congenital pes cavus 02/25/2021   History of uterine fibroid 01/05/2021   Herpes 07/12/2020   GERD (gastroesophageal reflux disease) 05/23/2020   Vaginal atrophy 05/23/2020   Cervical spine arthritis 05/23/2020   Osteopenia 05/23/2020   Insomnia 05/20/2020   Postmenopausal hormone therapy 05/20/2020    Allergies: Allergies[1] Medications: Current Medications[2]  Observations/Objective: Patient is well-developed, well-nourished in no acute distress.  Resting comfortably  at home.  Head is normocephalic, atraumatic.  No labored breathing.  Speech is clear and coherent with logical content.  Patient is alert and oriented at baseline.    Assessment and Plan: 1. Acute laryngitis (Primary) - methylPREDNISolone  (  MEDROL  DOSEPAK) 4 MG TBPK tablet; Take as directed  Dispense: 1 each; Refill: 0  Stay well hydrated with clear liquids Start medicine as prescribed.  Continue to watch for worsening symptoms. Schedule a virtual appointment or follow up at an urgent care clinic if symptoms don't improve.  Pt verbalized understanding and in agreement.     Follow Up Instructions: I discussed the assessment and treatment plan with the patient. The patient was provided an opportunity to ask questions and all were answered. The patient agreed with the plan and demonstrated an understanding of the instructions.  A copy of instructions were sent to the patient via MyChart unless otherwise noted below.   Patient has requested to receive PHI (AVS, Work Notes, etc) pertaining to this video visit through e-mail as they are currently without active MyChart. They have voiced understand that email is not considered secure and their health information could be viewed by someone other than the patient.   The patient was advised to call back or seek an in-person evaluation if the symptoms worsen or if the condition fails to improve as anticipated.    [name], PA-C     [1]  Allergies Allergen Reactions   Cyclobenzaprine Other (See Comments)    Altered mental state  cyclobenzaprine   Pertussis Vaccines Swelling    Questionable  [2]  Current Outpatient Medications:    methylPREDNISolone  (MEDROL  DOSEPAK) 4 MG TBPK tablet, Take as directed, Disp: 1 each, Rfl: 0   albuterol  (VENTOLIN  HFA) 108 (90 Base) MCG/ACT inhaler, Inhale 2 puffs into the lungs every 6 (six) hours as needed for wheezing or shortness of breath., Disp: 8 g, Rfl: 0   Biotin 5 MG CAPS, Take 5 mg by mouth daily., Disp: , Rfl:    CALCIUM PO, Take 800 mg by mouth daily., Disp: , Rfl:    celecoxib  (CELEBREX ) 200 MG capsule, Take 200 mg by mouth 2 (two) times daily., Disp: , Rfl:    Cholecalciferol 50 MCG (2000 UT) CAPS, Take 2,000 Units by mouth daily., Disp: , Rfl:    esomeprazole (NEXIUM) 20 MG capsule, Take 20 mg by mouth daily., Disp: , Rfl:    Estradiol  (YUVAFEM ) 10 MCG TABS vaginal tablet, Place 1 tablet (10 mcg total) vaginally twice a week, Disp: 18 tablet, Rfl: 0   Melatonin 5 MG CAPS, Take 5 mg by mouth at bedtime., Disp: , Rfl:    progesterone  (PROMETRIUM ) 200 MG capsule,  Take 1 capsule by mouth at bedtime., Disp: , Rfl:    promethazine -dextromethorphan (PROMETHAZINE -DM) 6.25-15 MG/5ML syrup, Take 5 mLs by mouth 4 (four) times daily as needed for cough., Disp: 118 mL, Rfl: 0   testosterone  (ANDROGEL ) 50 MG/5GM (1%) GEL, Apply 0.5cc daily in syringe to upper outer or inner thigh, at least an hour after shaving., Disp: , Rfl:    traMADol  (ULTRAM ) 50 MG tablet, Take 25 mg by mouth every 6 (six) hours as needed for moderate pain (pain score 4-6). (Patient not taking: Reported on 09/26/2024), Disp: , Rfl:    traZODone  (DESYREL ) 50 MG tablet, TAKE 2 TABLETS BY MOUTH AT  BEDTIME, Disp: 180 tablet, Rfl: 3   valACYclovir  (VALTREX ) 500 MG tablet, Take 2 (two) tablets by mouth once daily for total of 5 days as needed for outbreaks., Disp: 60 tablet, Rfl: 4  "

## 2025-01-20 ENCOUNTER — Ambulatory Visit: Admitting: Nurse Practitioner

## 2025-06-05 ENCOUNTER — Encounter: Admitting: Dermatology

## 2025-06-15 ENCOUNTER — Encounter: Admitting: Dermatology

## 2025-09-11 ENCOUNTER — Encounter: Admitting: Nurse Practitioner
